# Patient Record
Sex: Male | Born: 1970 | Race: White | Hispanic: No | Marital: Single | Smoking: Current every day smoker
Health system: Southern US, Community
[De-identification: ages and names within clinical notes are randomized; demographics above are authoritative.]

## PROBLEM LIST (undated history)

## (undated) DIAGNOSIS — M1990 Unspecified osteoarthritis, unspecified site: Secondary | ICD-10-CM

## (undated) DIAGNOSIS — F319 Bipolar disorder, unspecified: Secondary | ICD-10-CM

## (undated) DIAGNOSIS — J439 Emphysema, unspecified: Secondary | ICD-10-CM

## (undated) DIAGNOSIS — K759 Inflammatory liver disease, unspecified: Secondary | ICD-10-CM

## (undated) DIAGNOSIS — F419 Anxiety disorder, unspecified: Secondary | ICD-10-CM

## (undated) DIAGNOSIS — C6290 Malignant neoplasm of unspecified testis, unspecified whether descended or undescended: Secondary | ICD-10-CM

## (undated) DIAGNOSIS — F209 Schizophrenia, unspecified: Secondary | ICD-10-CM

## (undated) DIAGNOSIS — R0600 Dyspnea, unspecified: Secondary | ICD-10-CM

## (undated) DIAGNOSIS — J189 Pneumonia, unspecified organism: Secondary | ICD-10-CM

## (undated) DIAGNOSIS — S2249XA Multiple fractures of ribs, unspecified side, initial encounter for closed fracture: Secondary | ICD-10-CM

## (undated) HISTORY — PX: OTHER SURGICAL HISTORY: SHX169

## (undated) HISTORY — PX: REPLACEMENT TOTAL KNEE: SUR1224

## (undated) HISTORY — PX: HAND SURGERY: SHX662

## (undated) HISTORY — PX: NECK SURGERY: SHX720

---

## 2017-05-17 DIAGNOSIS — Z8547 Personal history of malignant neoplasm of testis: Secondary | ICD-10-CM | POA: Insufficient documentation

## 2017-05-17 DIAGNOSIS — M5441 Lumbago with sciatica, right side: Secondary | ICD-10-CM | POA: Insufficient documentation

## 2017-05-17 DIAGNOSIS — M5442 Lumbago with sciatica, left side: Secondary | ICD-10-CM | POA: Insufficient documentation

## 2017-05-17 HISTORY — DX: Personal history of malignant neoplasm of testis: Z85.47

## 2018-03-22 ENCOUNTER — Emergency Department (HOSPITAL_COMMUNITY)
Admission: EM | Admit: 2018-03-22 | Discharge: 2018-03-22 | Disposition: A | Discharge status: Home / Self Care | Payer: Medicare Other | Attending: Emergency Medicine | Admitting: Emergency Medicine

## 2018-03-22 ENCOUNTER — Emergency Department (HOSPITAL_COMMUNITY): Payer: Medicare Other

## 2018-03-22 DIAGNOSIS — S025XXA Fracture of tooth (traumatic), initial encounter for closed fracture: Secondary | ICD-10-CM | POA: Diagnosis not present

## 2018-03-22 DIAGNOSIS — Y998 Other external cause status: Secondary | ICD-10-CM | POA: Diagnosis not present

## 2018-03-22 DIAGNOSIS — Y929 Unspecified place or not applicable: Secondary | ICD-10-CM | POA: Diagnosis not present

## 2018-03-22 DIAGNOSIS — W01198A Fall on same level from slipping, tripping and stumbling with subsequent striking against other object, initial encounter: Secondary | ICD-10-CM | POA: Insufficient documentation

## 2018-03-22 DIAGNOSIS — S0993XA Unspecified injury of face, initial encounter: Secondary | ICD-10-CM

## 2018-03-22 DIAGNOSIS — Y939 Activity, unspecified: Secondary | ICD-10-CM | POA: Diagnosis not present

## 2018-03-22 MED ORDER — HYDROCODONE-ACETAMINOPHEN 5-325 MG PO TABS
2.0000 | ORAL_TABLET | Freq: Once | ORAL | Status: AC
  Administered 2018-03-22: 2 via ORAL
  Filled 2018-03-22: qty 2

## 2018-03-22 NOTE — Care Management (Addendum)
   Name: Ernest Reese, Ernest Reese DOB: 1971/09/10   ED Referral Summary with Disclaimer  Name  Ernest Reese, Ernest Reese     MRN:  [id number]  Department: [facility] Emergency Department  Date of Visit: 03/22/2018  Mr. was seen in the ED and was advised to follow up with you as necessary. It Is the sole responsibility of the patient to arrange for an appointment with you or your practice.  This notification in no way is meant to establish a doctor-patient relationship.   Faxed via [facility] to Dr. Diona Browner office. No further ED CM needs identified

## 2018-03-22 NOTE — ED Provider Notes (Signed)
Patient placed in Quick Look pathway, seen and evaluated   Chief Complaint: Face pain HPI:   Department with chief complaint of wall injury.  States that he slipped in the rain and hit his face on the hitch of his truck knocking out his teeth.  Denies LOC.  Denies any other injury.   ROS: face pain  Physical Exam:   Gen: No distress  Neuro: Awake and Alert  Skin: Warm    Focused Exam:  Physical Exam  Constitutional: Pt appears well-developed and well-nourished.  HENT:  Head: Normocephalic.  Right Ear: Tympanic membrane, external ear and ear canal normal.  Left Ear: Tympanic membrane, external ear and ear canal normal.  Nose: Nose normal. Right sinus exhibits no maxillary sinus tenderness and no frontal sinus tenderness. Left sinus exhibits no maxillary sinus tenderness and no frontal sinus tenderness.  Mouth/Throat: Uvula is midline, oropharynx is clear and moist and mucous membranes are normal. No oral lesions. No uvula swelling or lacerations. No oropharyngeal exudate, posterior oropharyngeal edema, posterior oropharyngeal erythema or tonsillar abscesses.  Poor dentition No gingival swelling, fluctuance or induration No gross abscess  No sublingual edema, tenderness to palpation, or sign of Ludwig's angina, or deep space infection Pain at right inferior maxilla Several broken teeth Eyes: Conjunctivae are normal. Pupils are equal, round, and reactive to light. Right eye exhibits no discharge. Left eye exhibits no discharge.  Neck: Normal range of motion. Neck supple.  No stridor Handling secretions without difficulty No nuchal rigidity No cervical lymphadenopathy Cardiovascular: Normal rate, regular rhythm and normal heart sounds.   Pulmonary/Chest: Effort normal. No respiratory distress.  Equal chest rise  Abdominal: Soft. Bowel sounds are normal. Pt exhibits no distension. There is no tenderness.  Lymphadenopathy: Pt has no cervical adenopathy.  Neurological: Pt is alert and  oriented x 4  Skin: Skin is warm and dry.  Psychiatric: Pt has a normal mood and affect.  Nursing note and vitals reviewed.      Initiation of care has begun. The patient has been counseled on the process, plan, and necessity for staying for the completion/evaluation, and the remainder of the medical screening examination    Montine Circle, Hershal Coria 03/22/18 1401    Dorie Rank, MD 03/24/18 1149

## 2018-03-22 NOTE — Discharge Instructions (Addendum)
Please read attached information. If you experience any new or worsening signs or symptoms please return to the emergency room for evaluation. Please follow-up with your primary care provider or specialist as discussed.  °

## 2018-03-22 NOTE — ED Triage Notes (Signed)
Pt fell last night and hit right side of his front teeth on hitch and lost 2 teeth.

## 2018-09-22 ENCOUNTER — Encounter (HOSPITAL_BASED_OUTPATIENT_CLINIC_OR_DEPARTMENT_OTHER): Payer: Self-pay | Admitting: Emergency Medicine

## 2018-09-22 ENCOUNTER — Emergency Department (HOSPITAL_BASED_OUTPATIENT_CLINIC_OR_DEPARTMENT_OTHER): Payer: Medicare Other

## 2018-09-22 ENCOUNTER — Emergency Department (HOSPITAL_BASED_OUTPATIENT_CLINIC_OR_DEPARTMENT_OTHER)
Admission: EM | Admit: 2018-09-22 | Discharge: 2018-09-22 | Disposition: A | Discharge status: Home / Self Care | Payer: Medicare Other | Attending: Emergency Medicine | Admitting: Emergency Medicine

## 2018-09-22 ENCOUNTER — Other Ambulatory Visit: Payer: Self-pay

## 2018-09-22 DIAGNOSIS — Z8547 Personal history of malignant neoplasm of testis: Secondary | ICD-10-CM | POA: Insufficient documentation

## 2018-09-22 DIAGNOSIS — Z96659 Presence of unspecified artificial knee joint: Secondary | ICD-10-CM | POA: Insufficient documentation

## 2018-09-22 DIAGNOSIS — M25511 Pain in right shoulder: Secondary | ICD-10-CM | POA: Insufficient documentation

## 2018-09-22 DIAGNOSIS — F17210 Nicotine dependence, cigarettes, uncomplicated: Secondary | ICD-10-CM | POA: Insufficient documentation

## 2018-09-22 DIAGNOSIS — Z79899 Other long term (current) drug therapy: Secondary | ICD-10-CM | POA: Diagnosis not present

## 2018-09-22 HISTORY — DX: Emphysema, unspecified: J43.9

## 2018-09-22 HISTORY — DX: Malignant neoplasm of unspecified testis, unspecified whether descended or undescended: C62.90

## 2018-09-22 MED ORDER — KETOROLAC TROMETHAMINE 60 MG/2ML IM SOLN
60.0000 mg | Freq: Once | INTRAMUSCULAR | Status: AC
  Administered 2018-09-22: 60 mg via INTRAMUSCULAR
  Filled 2018-09-22: qty 2

## 2018-09-22 MED ORDER — METHYLPREDNISOLONE SODIUM SUCC 125 MG IJ SOLR
125.0000 mg | Freq: Once | INTRAMUSCULAR | Status: AC
  Administered 2018-09-22: 125 mg via INTRAMUSCULAR
  Filled 2018-09-22: qty 2

## 2018-09-22 MED ORDER — DICLOFENAC SODIUM 75 MG PO TBEC: 75.0000 mg | DELAYED_RELEASE_TABLET | Freq: Two times a day (BID) | ORAL | 0 refills | Status: DC

## 2018-09-22 MED ORDER — METHOCARBAMOL 500 MG PO TABS: 500.0000 mg | ORAL_TABLET | Freq: Two times a day (BID) | ORAL | 0 refills | Status: DC

## 2018-09-22 NOTE — Discharge Instructions (Signed)
Return if any problems.

## 2018-09-22 NOTE — ED Triage Notes (Signed)
Pt is c/o right shoulder pain  Pt states he does not know if it is his rotator cuff or his pinched nerve in his neck  Pt states he woke up with the pain today and it is getting worse  Pt states he is not able to move it or turn his head

## 2018-09-22 NOTE — ED Provider Notes (Signed)
[facility] EMERGENCY DEPARTMENT Provider Note   CSN: [id number] Arrival date & time: 09/22/18  1916     History   Chief Complaint Chief Complaint  Patient presents with  . Shoulder Pain    HPI Ernest Reese is a 47 y.o. male.  The history is provided by the patient. No language interpreter was used.  Shoulder Pain   This is a new problem. The current episode started more than 1 week ago. The problem occurs constantly. The problem has been gradually worsening. The pain is present in the right shoulder. The quality of the pain is described as aching. The pain is moderate. Associated symptoms include limited range of motion. He has tried nothing for the symptoms. The treatment provided no relief. There has been no history of extremity trauma.  Pt reports he has shoulder pain.  Pt reports he is having trouble lifting his arm.   Past Medical History:  Diagnosis Date  . Cancer ([place])   . Emphysema lung ([place])   . Testicular cancer ([place])     There are no active problems to display for this patient.   Past Surgical History:  Procedure Laterality Date  . HAND SURGERY    . NECK SURGERY    . REPLACEMENT TOTAL KNEE    . testicle removed          Home Medications    Prior to Admission medications   Medication Sig Start Date End Date Taking? Authorizing Provider  albuterol (PROVENTIL HFA;VENTOLIN HFA) 108 (90 Base) MCG/ACT inhaler Inhale into the lungs every 6 (six) hours as needed for wheezing or shortness of breath.   Yes [provider]  albuterol (PROVENTIL) (2.5 MG/3ML) 0.083% nebulizer solution Take 2.5 mg by nebulization every 6 (six) hours as needed for wheezing or shortness of breath.   Yes [provider]  acetaminophen (TYLENOL) 500 MG tablet Take 1,000 mg by mouth every 6 (six) hours as needed.    [provider]  diclofenac (VOLTAREN) 75 MG EC tablet Take 1 tablet (75 mg total) by mouth 2 (two) times daily. 09/22/18   Fransico Meadow, PA-C  methocarbamol (ROBAXIN) 500 MG tablet Take 1 tablet (500 mg total) by mouth 2 (two) times daily. 09/22/18   Fransico Meadow, PA-C    Family History Family History  Problem Relation Age of Onset  . Cancer Father     Social History Social History   Tobacco Use  . Smoking status: Current Every Day Smoker  . Smokeless tobacco: Never Used  Substance Use Topics  . Alcohol use: Not Currently    Frequency: Never  . Drug use: Never     Allergies   Patient has no known allergies.   Review of Systems Review of Systems  All other systems reviewed and are negative.    Physical Exam Updated Vital Signs BP 99/67 (BP Location: Left Arm)   Pulse 70   Temp 98.2 F (36.8 C) (Oral)   Resp 18   Ht 6' (1.829 m)   Wt 64.4 kg   SpO2 95%   BMI 19.26 kg/m   Physical Exam  Constitutional: He is oriented to person, place, and time. He appears well-developed and well-nourished.  HENT:  Head: Normocephalic.  Eyes: EOM are normal.  Neck: Normal range of motion.  Pulmonary/Chest: Effort normal.  Abdominal: He exhibits no distension.  Musculoskeletal: He exhibits tenderness.  Tender right shoulder, pain with range of motion, limited range of motion,   nv  and ns intact   Neurological: He is alert and oriented to person, place, and time.  Psychiatric: He has a normal mood and affect.  Nursing note and vitals reviewed.    ED Treatments / Results  Labs (all labs ordered are listed, but only abnormal results are displayed) Labs Reviewed - No data to display  EKG None  Radiology Dg Shoulder Right  Result Date: 09/22/2018 CLINICAL DATA:  47 year old male with right shoulder pain. EXAM: RIGHT SHOULDER - 2+ VIEW COMPARISON:  None. FINDINGS: There is no acute fracture or dislocation. The bones are well mineralized. No arthritic changes. The soft tissues appear unremarkable. Cervical spine fusion plate and screws noted. IMPRESSION: Negative. Electronically Signed   By: Anner Crete M.D.   On: 09/22/2018 21:18    Procedures Procedures (including critical care time)  Medications Ordered in ED Medications  methylPREDNISolone sodium succinate (SOLU-MEDROL) 125 mg/2 mL injection 125 mg (125 mg Intramuscular Given 09/22/18 2119)  ketorolac (TORADOL) injection 60 mg (60 mg Intramuscular Given 09/22/18 2118)     Initial Impression / Assessment and Plan / ED Course  I have reviewed the triage vital signs and the nursing notes.  Pertinent labs & imaging results that were available during my care of the patient were reviewed by me and considered in my medical decision making (see chart for details).    MDM   Pt given torodol and solumderol.   Xray reviewed and discussed with pt.  Pt advised to follow up with Orthopaedist for evaluation.   Final Clinical Impressions(s) / ED Diagnoses   Final diagnoses:  Acute pain of right shoulder    ED Discharge Orders         Ordered    diclofenac (VOLTAREN) 75 MG EC tablet  2 times daily     09/22/18 2139    methocarbamol (ROBAXIN) 500 MG tablet  2 times daily     09/22/18 2139        An After Visit Summary was printed and given to the patient.    Fransico Meadow, PA-C 09/22/18 [place], Dan, DO 09/25/18 0700

## 2018-12-09 ENCOUNTER — Inpatient Hospital Stay (HOSPITAL_COMMUNITY): Payer: Medicare Other

## 2018-12-09 ENCOUNTER — Encounter (HOSPITAL_BASED_OUTPATIENT_CLINIC_OR_DEPARTMENT_OTHER): Payer: Self-pay | Admitting: Emergency Medicine

## 2018-12-09 ENCOUNTER — Emergency Department (HOSPITAL_BASED_OUTPATIENT_CLINIC_OR_DEPARTMENT_OTHER): Payer: Medicare Other

## 2018-12-09 ENCOUNTER — Other Ambulatory Visit: Payer: Self-pay

## 2018-12-09 ENCOUNTER — Inpatient Hospital Stay (HOSPITAL_BASED_OUTPATIENT_CLINIC_OR_DEPARTMENT_OTHER)
Admission: EM | Admit: 2018-12-09 | Discharge: 2018-12-10 | DRG: 683 | Discharge status: Against Medical Advice | Payer: Medicare Other | Attending: Internal Medicine | Admitting: Internal Medicine

## 2018-12-09 DIAGNOSIS — Z8547 Personal history of malignant neoplasm of testis: Secondary | ICD-10-CM

## 2018-12-09 DIAGNOSIS — Z96659 Presence of unspecified artificial knee joint: Secondary | ICD-10-CM | POA: Diagnosis present

## 2018-12-09 DIAGNOSIS — N179 Acute kidney failure, unspecified: Secondary | ICD-10-CM | POA: Diagnosis present

## 2018-12-09 DIAGNOSIS — J441 Chronic obstructive pulmonary disease with (acute) exacerbation: Secondary | ICD-10-CM | POA: Diagnosis present

## 2018-12-09 DIAGNOSIS — E872 Acidosis: Secondary | ICD-10-CM | POA: Diagnosis present

## 2018-12-09 DIAGNOSIS — E46 Unspecified protein-calorie malnutrition: Secondary | ICD-10-CM | POA: Insufficient documentation

## 2018-12-09 DIAGNOSIS — Z791 Long term (current) use of non-steroidal anti-inflammatories (NSAID): Secondary | ICD-10-CM

## 2018-12-09 DIAGNOSIS — Z79899 Other long term (current) drug therapy: Secondary | ICD-10-CM | POA: Diagnosis not present

## 2018-12-09 DIAGNOSIS — S46001A Unspecified injury of muscle(s) and tendon(s) of the rotator cuff of right shoulder, initial encounter: Secondary | ICD-10-CM | POA: Diagnosis present

## 2018-12-09 DIAGNOSIS — N289 Disorder of kidney and ureter, unspecified: Secondary | ICD-10-CM | POA: Insufficient documentation

## 2018-12-09 DIAGNOSIS — Y990 Civilian activity done for income or pay: Secondary | ICD-10-CM

## 2018-12-09 DIAGNOSIS — K7469 Other cirrhosis of liver: Secondary | ICD-10-CM | POA: Diagnosis not present

## 2018-12-09 DIAGNOSIS — R739 Hyperglycemia, unspecified: Secondary | ICD-10-CM | POA: Diagnosis present

## 2018-12-09 DIAGNOSIS — D509 Iron deficiency anemia, unspecified: Secondary | ICD-10-CM | POA: Diagnosis present

## 2018-12-09 DIAGNOSIS — E7439 Other disorders of intestinal carbohydrate absorption: Secondary | ICD-10-CM | POA: Diagnosis present

## 2018-12-09 DIAGNOSIS — J439 Emphysema, unspecified: Secondary | ICD-10-CM | POA: Insufficient documentation

## 2018-12-09 DIAGNOSIS — N19 Unspecified kidney failure: Secondary | ICD-10-CM

## 2018-12-09 DIAGNOSIS — F17210 Nicotine dependence, cigarettes, uncomplicated: Secondary | ICD-10-CM | POA: Diagnosis present

## 2018-12-09 DIAGNOSIS — N185 Chronic kidney disease, stage 5: Secondary | ICD-10-CM

## 2018-12-09 DIAGNOSIS — D649 Anemia, unspecified: Secondary | ICD-10-CM | POA: Diagnosis present

## 2018-12-09 DIAGNOSIS — E440 Moderate protein-calorie malnutrition: Secondary | ICD-10-CM

## 2018-12-09 DIAGNOSIS — K7460 Unspecified cirrhosis of liver: Secondary | ICD-10-CM | POA: Diagnosis present

## 2018-12-09 DIAGNOSIS — B1920 Unspecified viral hepatitis C without hepatic coma: Secondary | ICD-10-CM | POA: Diagnosis not present

## 2018-12-09 DIAGNOSIS — D631 Anemia in chronic kidney disease: Secondary | ICD-10-CM

## 2018-12-09 DIAGNOSIS — Z9079 Acquired absence of other genital organ(s): Secondary | ICD-10-CM | POA: Diagnosis not present

## 2018-12-09 DIAGNOSIS — I10 Essential (primary) hypertension: Secondary | ICD-10-CM | POA: Diagnosis present

## 2018-12-09 DIAGNOSIS — E875 Hyperkalemia: Secondary | ICD-10-CM | POA: Diagnosis present

## 2018-12-09 HISTORY — DX: Moderate protein-calorie malnutrition: E44.0

## 2018-12-09 HISTORY — DX: Acute kidney failure, unspecified: N17.9

## 2018-12-09 LAB — CBC WITH DIFFERENTIAL/PLATELET
Abs Immature Granulocytes: 0.1 10*3/uL — ABNORMAL HIGH (ref 0.00–0.07)
BASOS ABS: 0 10*3/uL (ref 0.0–0.1)
BASOS PCT: 0 %
EOS ABS: 0.1 10*3/uL (ref 0.0–0.5)
EOS PCT: 1 %
HCT: 22.9 % — ABNORMAL LOW (ref 39.0–52.0)
Hemoglobin: 7.3 g/dL — ABNORMAL LOW (ref 13.0–17.0)
Immature Granulocytes: 1 %
LYMPHS PCT: 12 %
Lymphs Abs: 1.4 10*3/uL (ref 0.7–4.0)
MCH: 26.4 pg (ref 26.0–34.0)
MCHC: 31.9 g/dL (ref 30.0–36.0)
MCV: 83 fL (ref 80.0–100.0)
MONO ABS: 1 10*3/uL (ref 0.1–1.0)
Monocytes Relative: 9 %
NRBC: 0 % (ref 0.0–0.2)
Neutro Abs: 8.9 10*3/uL — ABNORMAL HIGH (ref 1.7–7.7)
Neutrophils Relative %: 77 %
PLATELETS: 193 10*3/uL (ref 150–400)
RBC: 2.76 MIL/uL — AB (ref 4.22–5.81)
RDW: 15.7 % — AB (ref 11.5–15.5)
WBC: 11.6 10*3/uL — AB (ref 4.0–10.5)

## 2018-12-09 LAB — URINALYSIS, ROUTINE W REFLEX MICROSCOPIC
Bilirubin Urine: NEGATIVE
Glucose, UA: 150 mg/dL — AB
Ketones, ur: NEGATIVE mg/dL
Nitrite: POSITIVE — AB
Protein, ur: 100 mg/dL — AB
Specific Gravity, Urine: 1.01 (ref 1.005–1.030)
pH: 5 (ref 5.0–8.0)

## 2018-12-09 LAB — RETICULOCYTES
Immature Retic Fract: 12.4 % (ref 2.3–15.9)
RBC.: 2.79 MIL/uL — ABNORMAL LOW (ref 4.22–5.81)
Retic Count, Absolute: 14.2 10*3/uL — ABNORMAL LOW (ref 19.0–186.0)
Retic Ct Pct: 0.5 % (ref 0.4–3.1)

## 2018-12-09 LAB — HEMOGLOBIN A1C
Hgb A1c MFr Bld: 6.1 % — ABNORMAL HIGH (ref 4.8–5.6)
Mean Plasma Glucose: 128.37 mg/dL

## 2018-12-09 LAB — RAPID URINE DRUG SCREEN, HOSP PERFORMED
Amphetamines: NOT DETECTED
Barbiturates: NOT DETECTED
Benzodiazepines: NOT DETECTED
Cocaine: NOT DETECTED
Opiates: POSITIVE — AB
Tetrahydrocannabinol: NOT DETECTED

## 2018-12-09 LAB — BASIC METABOLIC PANEL
Anion gap: 14 (ref 5–15)
Anion gap: 19 — ABNORMAL HIGH (ref 5–15)
BUN: 83 mg/dL — AB (ref 6–20)
BUN: 89 mg/dL — ABNORMAL HIGH (ref 6–20)
CALCIUM: 6.4 mg/dL — AB (ref 8.9–10.3)
CHLORIDE: 105 mmol/L (ref 98–111)
CO2: 14 mmol/L — ABNORMAL LOW (ref 22–32)
CO2: 12 mmol/L — ABNORMAL LOW (ref 22–32)
CREATININE: 8.33 mg/dL — AB (ref 0.61–1.24)
Calcium: 6.8 mg/dL — ABNORMAL LOW (ref 8.9–10.3)
Chloride: 105 mmol/L (ref 98–111)
Creatinine, Ser: 8.59 mg/dL — ABNORMAL HIGH (ref 0.61–1.24)
GFR calc Af Amer: 8 mL/min — ABNORMAL LOW (ref >60)
GFR calc Af Amer: 8 mL/min — ABNORMAL LOW (ref >60)
GFR calc non Af Amer: 7 mL/min — ABNORMAL LOW (ref >60)
GFR, EST NON AFRICAN AMERICAN: 7 mL/min — AB (ref >60)
Glucose, Bld: 164 mg/dL — ABNORMAL HIGH (ref 70–99)
Glucose, Bld: 254 mg/dL — ABNORMAL HIGH (ref 70–99)
Potassium: 4.1 mmol/L (ref 3.5–5.1)
Potassium: 4.9 mmol/L (ref 3.5–5.1)
SODIUM: 133 mmol/L — AB (ref 135–145)
SODIUM: 136 mmol/L (ref 135–145)

## 2018-12-09 LAB — CBC
HCT: 23.3 % — ABNORMAL LOW (ref 39.0–52.0)
Hemoglobin: 7.4 g/dL — ABNORMAL LOW (ref 13.0–17.0)
MCH: 26.5 pg (ref 26.0–34.0)
MCHC: 31.8 g/dL (ref 30.0–36.0)
MCV: 83.5 fL (ref 80.0–100.0)
NRBC: 0 % (ref 0.0–0.2)
Platelets: 194 10*3/uL (ref 150–400)
RBC: 2.79 MIL/uL — ABNORMAL LOW (ref 4.22–5.81)
RDW: 15.6 % — AB (ref 11.5–15.5)
WBC: 8.1 10*3/uL (ref 4.0–10.5)

## 2018-12-09 LAB — IRON AND TIBC
IRON: 19 ug/dL — AB (ref 45–182)
Saturation Ratios: 9 % — ABNORMAL LOW (ref 17.9–39.5)
TIBC: 217 ug/dL — ABNORMAL LOW (ref 250–450)
UIBC: 198 ug/dL

## 2018-12-09 LAB — SODIUM, URINE, RANDOM: Sodium, Ur: 82 mmol/L

## 2018-12-09 LAB — VITAMIN B12: Vitamin B-12: 701 pg/mL (ref 180–914)

## 2018-12-09 LAB — FERRITIN: Ferritin: 211 ng/mL (ref 24–336)

## 2018-12-09 LAB — MAGNESIUM: MAGNESIUM: 2 mg/dL (ref 1.7–2.4)

## 2018-12-09 LAB — FOLATE: Folate: 15.3 ng/mL (ref >5.9)

## 2018-12-09 LAB — PREPARE RBC (CROSSMATCH)

## 2018-12-09 LAB — ABO/RH: ABO/RH(D): B POS

## 2018-12-09 MED ORDER — STERILE WATER FOR INJECTION IV SOLN
INTRAVENOUS | Status: DC
  Administered 2018-12-09 – 2018-12-10 (×2): via INTRAVENOUS
  Filled 2018-12-09 (×5): qty 850

## 2018-12-09 MED ORDER — ALBUTEROL SULFATE (2.5 MG/3ML) 0.083% IN NEBU
2.5000 mg | INHALATION_SOLUTION | Freq: Once | RESPIRATORY_TRACT | Status: AC
  Administered 2018-12-09: 2.5 mg via RESPIRATORY_TRACT
  Filled 2018-12-09: qty 3

## 2018-12-09 MED ORDER — ALBUTEROL SULFATE (2.5 MG/3ML) 0.083% IN NEBU
2.5000 mg | INHALATION_SOLUTION | Freq: Four times a day (QID) | RESPIRATORY_TRACT | Status: DC | PRN
  Administered 2018-12-10 (×2): 2.5 mg via RESPIRATORY_TRACT
  Filled 2018-12-09 (×2): qty 3

## 2018-12-09 MED ORDER — ACETAMINOPHEN 650 MG RE SUPP: 650.0000 mg | Freq: Four times a day (QID) | RECTAL | Status: DC | PRN

## 2018-12-09 MED ORDER — ACETAMINOPHEN 325 MG PO TABS
650.0000 mg | ORAL_TABLET | Freq: Four times a day (QID) | ORAL | Status: DC | PRN
  Administered 2018-12-10: 650 mg via ORAL
  Filled 2018-12-09: qty 2

## 2018-12-09 MED ORDER — METHYLPREDNISOLONE SODIUM SUCC 125 MG IJ SOLR
125.0000 mg | Freq: Once | INTRAMUSCULAR | Status: AC
  Administered 2018-12-09: 125 mg via INTRAVENOUS
  Filled 2018-12-09: qty 2

## 2018-12-09 MED ORDER — TRAMADOL HCL 50 MG PO TABS
50.0000 mg | ORAL_TABLET | Freq: Four times a day (QID) | ORAL | Status: DC | PRN
  Administered 2018-12-09 – 2018-12-10 (×2): 50 mg via ORAL
  Filled 2018-12-09 (×2): qty 1

## 2018-12-09 MED ORDER — SODIUM CHLORIDE 0.9 % IV SOLN
1.0000 g | Freq: Once | INTRAVENOUS | Status: DC
  Filled 2018-12-09: qty 10

## 2018-12-09 MED ORDER — ENSURE ENLIVE PO LIQD
237.0000 mL | Freq: Two times a day (BID) | ORAL | Status: DC
  Administered 2018-12-10 (×2): 237 mL via ORAL

## 2018-12-09 MED ORDER — FAMOTIDINE 20 MG PO TABS
20.0000 mg | ORAL_TABLET | Freq: Every day | ORAL | Status: DC
  Administered 2018-12-09: 20 mg via ORAL
  Filled 2018-12-09: qty 1

## 2018-12-09 MED ORDER — ONDANSETRON HCL 4 MG/2ML IJ SOLN: 4.0000 mg | Freq: Four times a day (QID) | INTRAMUSCULAR | Status: DC | PRN

## 2018-12-09 MED ORDER — SODIUM CHLORIDE 0.9% IV SOLUTION: Freq: Once | INTRAVENOUS | Status: DC

## 2018-12-09 MED ORDER — ONDANSETRON HCL 4 MG PO TABS: 4.0000 mg | ORAL_TABLET | Freq: Four times a day (QID) | ORAL | Status: DC | PRN

## 2018-12-09 MED ORDER — SODIUM CHLORIDE 0.9 % IV SOLN
INTRAVENOUS | Status: DC | PRN
  Administered 2018-12-09: 04:00:00 via INTRAVENOUS

## 2018-12-09 MED ORDER — SODIUM CHLORIDE 0.9 % IV SOLN
INTRAVENOUS | Status: DC
  Administered 2018-12-09: 16:00:00 via INTRAVENOUS

## 2018-12-09 MED ORDER — SODIUM CHLORIDE 0.9 % IV BOLUS
1000.0000 mL | Freq: Once | INTRAVENOUS | Status: AC
  Administered 2018-12-09: 1000 mL via INTRAVENOUS

## 2018-12-09 MED ORDER — ALBUTEROL (5 MG/ML) CONTINUOUS INHALATION SOLN
10.0000 mg/h | INHALATION_SOLUTION | RESPIRATORY_TRACT | Status: AC
  Administered 2018-12-09: 10 mg/h via RESPIRATORY_TRACT
  Filled 2018-12-09: qty 20

## 2018-12-09 MED ORDER — MAGNESIUM SULFATE 2 GM/50ML IV SOLN
2.0000 g | Freq: Once | INTRAVENOUS | Status: AC
  Administered 2018-12-09: 2 g via INTRAVENOUS
  Filled 2018-12-09: qty 50

## 2018-12-09 MED ORDER — CALCIUM GLUCONATE-NACL 1-0.675 GM/50ML-% IV SOLN
1.0000 g | Freq: Once | INTRAVENOUS | Status: AC
  Administered 2018-12-09: 1000 mg via INTRAVENOUS

## 2018-12-09 MED ORDER — METHOCARBAMOL 500 MG PO TABS
500.0000 mg | ORAL_TABLET | Freq: Two times a day (BID) | ORAL | Status: DC
  Administered 2018-12-09 – 2018-12-10 (×2): 500 mg via ORAL
  Filled 2018-12-09 (×2): qty 1

## 2018-12-09 MED ORDER — IPRATROPIUM-ALBUTEROL 0.5-2.5 (3) MG/3ML IN SOLN
3.0000 mL | Freq: Once | RESPIRATORY_TRACT | Status: AC
  Administered 2018-12-09: 3 mL via RESPIRATORY_TRACT
  Filled 2018-12-09: qty 3

## 2018-12-09 MED ORDER — DIPHENHYDRAMINE HCL 25 MG PO CAPS
25.0000 mg | ORAL_CAPSULE | Freq: Once | ORAL | Status: DC
  Filled 2018-12-09: qty 1

## 2018-12-09 MED ORDER — FAMOTIDINE 20 MG PO TABS: 20.0000 mg | ORAL_TABLET | Freq: Two times a day (BID) | ORAL | Status: DC

## 2018-12-09 MED ORDER — CALCIUM GLUCONATE-NACL 1-0.675 GM/50ML-% IV SOLN
INTRAVENOUS | Status: AC
  Filled 2018-12-09: qty 50

## 2018-12-09 MED ORDER — PREDNISONE 20 MG PO TABS
40.0000 mg | ORAL_TABLET | Freq: Every day | ORAL | Status: DC
  Administered 2018-12-10: 40 mg via ORAL
  Filled 2018-12-09: qty 2

## 2018-12-09 MED ORDER — POLYETHYLENE GLYCOL 3350 17 G PO PACK: 17.0000 g | PACK | Freq: Every day | ORAL | Status: DC | PRN

## 2018-12-09 NOTE — Progress Notes (Signed)
Initial Nutrition Assessment  DOCUMENTATION CODES:   Non-severe (moderate) malnutrition in context of chronic illness  INTERVENTION:   Ensure Enlive po BID, each supplement provides 350 kcal and 20 grams of protein  Encouraged PO intake at meals.   NUTRITION DIAGNOSIS:   Moderate Malnutrition related to chronic illness(COPD) as evidenced by moderate fat depletion, moderate muscle depletion.  GOAL:   Patient will meet greater than or equal to 90% of their needs  MONITOR:   PO intake, Supplement acceptance, Labs  REASON FOR ASSESSMENT:   Consult COPD Protocol  ASSESSMENT:   Pt with PMH of emphysema and testicular cancer s/p chemo who is admitted with COPD exacerbation. Pt with new AKI and reports that he is taking 6400 mg of ibuprofen daily for the last 3 months.    Renal consult pending.   Pt reports that when he was in prison he was eating 3 meals per day and exercising and weighed 200 lb. When he got out he went back to his usual eating habits and has lost weight. He is currently 154 lb but he would like to be 170-180 lb. He reports having lost weight recently after he and his wife separated. He is unsure of how much over what time frame.  He has a good appetite and is hungry. He was unable to eat lunch due to pending test. He has drank some ensure before and likes vanilla. He is willing to drink these while here. He does admit to taking 32 - 200 mg ibuprofen tablets per day due to pain for the past 3 months. He reports having torn his R rotator cuff. He cannot lift his R arm and his R torso is very sore to touch as well.  Unable to complete an exam of all areas as lab is in room and attempting to draw blood before a transfusion. He has just been moved to his room and still has on jeans and his boots.  He has no trouble chewing or swallowing, although he is missing his upper teeth.   Medications reviewed and include: prednisone NS x 1 L then 125 ml/hr Labs reviewed: BUN:  83 (H), Cr: 8.33 (H)  NUTRITION - FOCUSED PHYSICAL EXAM:    Most Recent Value  Orbital Region  Moderate depletion  Upper Arm Region  Moderate depletion  Thoracic and Lumbar Region  Moderate depletion  Buccal Region  Moderate depletion  Temple Region  Moderate depletion  Clavicle Bone Region  Moderate depletion  Clavicle and Acromion Bone Region  Moderate depletion  Scapular Bone Region  Unable to assess  Dorsal Hand  Unable to assess  Patellar Region  Unable to assess  Anterior Thigh Region  Unable to assess  Posterior Calf Region  Unable to assess  Edema (RD Assessment)  None  Hair  Reviewed  Eyes  Reviewed  Mouth  Reviewed [missing upper teeth]  Skin  Reviewed  Nails  Reviewed       Diet Order:   Diet Order            Diet regular Room service appropriate? Yes; Fluid consistency: Thin  Diet effective now              EDUCATION NEEDS:   Education needs have been addressed  Skin:  Skin Assessment: Reviewed RN Assessment  Last BM:  unknown  Height:   Ht Readings from Last 1 Encounters:  12/09/18 6' (1.829 m)    Weight:   Wt Readings from Last 1 Encounters:  12/09/18 70.3 kg    Ideal Body Weight:  80.2 kg  BMI:  Body mass index is 21.02 kg/m.  Estimated Nutritional Needs:   Kcal:  2100-2300  Protein:  85-105 grams  Fluid:  >2.1 L/day  Maylon Peppers RD, LDN, CNSC [phone] Pager [phone] After Hours Pager

## 2018-12-09 NOTE — ED Notes (Signed)
Date and time results received: 12/09/18 0434 (use smartphrase ".now" to insert current time)  Test: calcium Critical Value: 6.9  Name of Provider Notified: Dr. Florina Ou  Orders Received? Or Actions Taken?: no new orders

## 2018-12-09 NOTE — ED Notes (Signed)
Spoke with Veronda in [facility] at [facility] about Calcium gluconate concentrations available

## 2018-12-09 NOTE — ED Notes (Signed)
Attempted to call report. Spoke with Ronalee Belts and he will be calling me back.

## 2018-12-09 NOTE — ED Notes (Signed)
Report given to Ronalee Belts, [place].  Ms. Jeovanny Cuadros was notified of this transfer.

## 2018-12-09 NOTE — ED Notes (Signed)
Date and time results received: 12/09/18 4:37 AM (use smartphrase ".now" to insert current time)  Test: Calcium Critical Value: 6.4  Name of Provider Notified: Dr. Jenny Reichmann [place]  Orders Received? Or Actions Taken?:

## 2018-12-09 NOTE — H&P (Signed)
History and Physical  JONTAY MASTON MPN:[id number] DOB: 12/26/1971 DOA: 12/09/2018  Referring physician: Dr Florina Ou, ED physician [facility]CP: [facility] [place]  Outpatient Specialists: none  Patient Coming From: home  Chief Complaint: SOB  HPI: CASMERE HOLLENBECK is a 47 y.o. male with a history of emphysema, history of testicular cancer.  Patient's presents with shortness of breath that started yesterday morning.  His symptoms are moderate to severe.  He has been using his albuterol at home without relief.  He does have a cough that is nonproductive.  He was seen at [facility] ED given steroids and nebulizer treatments with improvement.  Blood work was done which shows acute kidney injury.  Patient has had shoulder injury approximately 3 months ago and has been taking about 6400 mg of ibuprofen daily for the past 3 months.  He denies abdominal pain, melena, rectal bleeding.  No other palliating or provoking factors.  Emergency Department Course: Patient given Solu-Medrol 125 mg, neb, IV magnesium. Transferred here.  Review of Systems:   Pt denies any fevers, chills, nausea, vomiting, diarrhea, constipation, abdominal pain, orthopnea, palpitations, headache, vision changes, lightheadedness, dizziness, melena, rectal bleeding.  Review of systems are otherwise negative  Past Medical History:  Diagnosis Date  . Emphysema lung ([place])   . Testicular cancer [facility])    Past Surgical History:  Procedure Laterality Date  . HAND SURGERY    . NECK SURGERY    . REPLACEMENT TOTAL KNEE    . testicle removed     Social History:  reports that he has been smoking. He has never used smokeless tobacco. He reports previous alcohol use. He reports that he does not use drugs. Patient lives at home  No Known Allergies  Family History  Problem Relation Age of Onset  . Cancer Father       Prior to Admission medications   Medication Sig Start Date End Date Taking? Authorizing Provider    acetaminophen (TYLENOL) 500 MG tablet Take 1,000 mg by mouth every 6 (six) hours as needed.    [provider]  albuterol (PROVENTIL HFA;VENTOLIN HFA) 108 (90 Base) MCG/ACT inhaler Inhale into the lungs every 6 (six) hours as needed for wheezing or shortness of breath.    [provider]  albuterol (PROVENTIL) (2.5 MG/3ML) 0.083% nebulizer solution Take 2.5 mg by nebulization every 6 (six) hours as needed for wheezing or shortness of breath.    [provider]  diclofenac (VOLTAREN) 75 MG EC tablet Take 1 tablet (75 mg total) by mouth 2 (two) times daily. 09/22/18   Fransico Meadow, PA-C  methocarbamol (ROBAXIN) 500 MG tablet Take 1 tablet (500 mg total) by mouth 2 (two) times daily. 09/22/18   Fransico Meadow, PA-C    Physical Exam: BP 132/75 (BP Location: Right Arm)   Pulse 93   Temp 97.8 F (36.6 C) (Oral)   Resp 18   Ht 6' (1.829 m)   Wt 70.3 kg   SpO2 98%   BMI 21.02 kg/m   . General: Middle-aged Caucasian male. Awake and alert and oriented x3. No acute cardiopulmonary distress.  Marland Kitchen HEENT: Normocephalic atraumatic.  Right and left ears normal in appearance.  Pupils equal, round, reactive to light. Extraocular muscles are intact. Sclerae anicteric and noninjected.  Moist mucosal membranes. No mucosal lesions.  . Neck: Neck supple without lymphadenopathy. No carotid bruits. No masses palpated.  . Cardiovascular: Regular rate with normal S1-S2 sounds. No murmurs, rubs, gallops auscultated. No  JVD.  . Respiratory: Good respiratory effort.  Coarse expiratory wheezes. No accessory muscle use. . Abdomen: Soft, nontender, nondistended. Active bowel sounds. No masses or hepatosplenomegaly  . Skin: No rashes, lesions, or ulcerations.  Dry, warm to touch. 2+ dorsalis pedis and radial pulses. . Musculoskeletal: No calf or leg pain. All major joints not erythematous nontender.  No upper or lower joint deformation.  Good ROM.  No contractures  . Psychiatric: Intact  judgment and insight. Pleasant and cooperative. . Neurologic: No focal neurological deficits. Strength is 5/5 and symmetric in upper and lower extremities.  Cranial nerves II through XII are grossly intact.           Labs on Admission: I have personally reviewed following labs and imaging studies  CBC: Recent Labs  Lab 12/09/18 0339  WBC 11.6*  NEUTROABS 8.9*  HGB 7.3*  HCT 22.9*  MCV 83.0  PLT 194   Basic Metabolic Panel: Recent Labs  Lab 12/09/18 0339  NA 133*  K 4.1  CL 105  CO2 14*  GLUCOSE 164*  BUN 83*  CREATININE 8.33*  CALCIUM 6.4*  MG 2.0   GFR: Estimated Creatinine Clearance: 11 mL/min (A) (by C-G formula based on SCr of 8.33 mg/dL (H)). Liver Function Tests: No results for input(s): AST, ALT, ALKPHOS, BILITOT, PROT, ALBUMIN in the last 168 hours. No results for input(s): LIPASE, AMYLASE in the last 168 hours. No results for input(s): AMMONIA in the last 168 hours. Coagulation Profile: No results for input(s): INR, PROTIME in the last 168 hours. Cardiac Enzymes: No results for input(s): CKTOTAL, CKMB, CKMBINDEX, TROPONINI in the last 168 hours. BNP (last 3 results) No results for input(s): PROBNP in the last 8760 hours. HbA1C: No results for input(s): HGBA1C in the last 72 hours. CBG: No results for input(s): GLUCAP in the last 168 hours. Lipid Profile: No results for input(s): CHOL, HDL, LDLCALC, TRIG, CHOLHDL, LDLDIRECT in the last 72 hours. Thyroid Function Tests: No results for input(s): TSH, T4TOTAL, FREET4, T3FREE, THYROIDAB in the last 72 hours. Anemia Panel: No results for input(s): VITAMINB12, FOLATE, FERRITIN, TIBC, IRON, RETICCTPCT in the last 72 hours. Urine analysis: No results found for: COLORURINE, APPEARANCEUR, LABSPEC, PHURINE, GLUCOSEU, HGBUR, BILIRUBINUR, KETONESUR, PROTEINUR, UROBILINOGEN, NITRITE, LEUKOCYTESUR Sepsis Labs: @LABRCNTIP (procalcitonin:4,lacticidven:4) )No results found for this or any previous visit (from the past  240 hour(s)).   Radiological Exams on Admission: Dg Chest 2 View  Result Date: 12/09/2018 CLINICAL DATA:  Increased cough and shortness of breath. COPD. EXAM: CHEST - 2 VIEW COMPARISON:  01/23/2017. FINDINGS: Normal sized heart. Clear lungs. The lungs remain hyperexpanded with mild peribronchial thickening. Cervical spine fixation hardware. Mild scoliosis. IMPRESSION: Stable changes of COPD and chronic bronchitis. No acute abnormality. Electronically Signed   By: Claudie Revering M.D.   On: 12/09/2018 03:07   Dg Humerus Right  Result Date: 12/09/2018 CLINICAL DATA:  47 year old male with reported lump in the right mid arm. EXAM: RIGHT HUMERUS - 2+ VIEW COMPARISON:  Right shoulder radiograph dated 09/22/2018 FINDINGS: There is an area of periosteal thickening in the mid right humeral diaphysis which is not evaluated on this radiograph but may be related to chronic infection or stress. Further evaluation with MRI on a nonemergent basis recommended. There is no acute fracture or dislocation. There is elevated appearance of the humeral head in relation to the glenoid which may be partly positional but is concerning for chronic rotator cuff injury. Evaluation of the shoulder is limited on this radiograph. If there is pain  over the shoulder dedicated shoulder radiograph is recommended for better evaluation. The soft tissues are unremarkable. IMPRESSION: 1. No acute fracture or dislocation. 2. Focal area of periosteal thickening in the mid right humeral diaphysis possibly related to chronic infection or stress. Further evaluation with MRI on a nonemergent basis recommended. 3. Elevation of the humeral head in relation to the glenoid, concerning for chronic rotator cuff injury. If there is pain over the right shoulder dedicated radiograph of the shoulder recommended for further evaluation. Electronically Signed   By: Anner Crete M.D.   On: 12/09/2018 03:17     Assessment/Plan: Principal Problem:   Acute  kidney failure ([place]) Active Problems:   COPD with acute exacerbation ([place])   Anemia   ARF (acute renal failure) ([place])    This patient was discussed with the ED physician, including pertinent vitals, physical exam findings, labs, and imaging.  We also discussed care given by the ED provider.  1. Acute kidney failure a. Admit b. Secondary to heavy ibuprofen use with question of chronic component c. IV fluids: 1 L bolus with 125 mL's continuously. d. Patient still making plenty of urine e. Check UA, renal ultrasound, SPEP, UPEP f. Repeat BMP now and tomorrow morning g. Consulted with nephrology who will see the patient. h. Will stop NSAIDs 2. COPD with acute exacerbation a. Prednisone b. Albuterol every 6 hours as needed 3. Anemia a. Uncertain as to the patient's etiology -possibly component of chronic, especially if there is chronic kidney disease. b. Check anemia labs c. Transfuse 2 units d. Recheck hemoglobin tomorrow e. Hemoccult  DVT prophylaxis: SCDs Consultants: Nephrology Code Status: Full code Family Communication: None Disposition Plan: Patient to return home following admission   Jacob Moores [facility] Pager [phone]  If 7PM-7AM, please contact night-coverage [email or web address] Password [email or web address]

## 2018-12-09 NOTE — Plan of Care (Addendum)
47 yo M with what he thought was COPD exacerbation, but in fact patient is in what looks like probably chronic renal failure undiagnosed with creat of 8.x.  Calcium 6.4, got 1g calcium.  Bicarb 14, anemia with HGB of 7.3.  Suspicion is undiagnosed underlying CKD stage 5.  Call nephrology this morning after he arrives.  Will send to Tele.  K 4.1.  Please call flow manager after patient arrives to [facility].

## 2018-12-09 NOTE — ED Provider Notes (Signed)
[facility] DEPT [facility] Provider Note: Georgena Spurling, MD, FACEP  CSN: [id number] MRN: [id number] ARRIVAL: 12/09/18 at [place]: [facility]   HISTORY OF PRESENT ILLNESS  12/09/18 3:18 AM Ernest Reese is a 47 y.o. male with a history of COPD.  He is here with a COPD exacerbation that began yesterday morning.  Symptoms are moderate to severe.  He has been using his albuterol at home without adequate relief.  He had a similar episode last year that resulted in hospitalization for pneumonia and he wishes to avoid that.  He was given to neb treatments prior to my evaluation with only partial relief of his dyspnea.  He continues to have expiratory wheezes.  The patient injured his right rotator cuff about 3 months ago.  He continues to have pain in that shoulder as well as a painful knot on his right mid humerus.    Past Medical History:  Diagnosis Date  . Emphysema lung ([place])   . Testicular cancer [facility])     Past Surgical History:  Procedure Laterality Date  . HAND SURGERY    . NECK SURGERY    . REPLACEMENT TOTAL KNEE    . testicle removed      Family History  Problem Relation Age of Onset  . Cancer Father     Social History   Tobacco Use  . Smoking status: Current Every Day Smoker  . Smokeless tobacco: Never Used  Substance Use Topics  . Alcohol use: Not Currently    Frequency: Never  . Drug use: Never    Prior to Admission medications   Medication Sig Start Date End Date Taking? Authorizing Provider  acetaminophen (TYLENOL) 500 MG tablet Take 1,000 mg by mouth every 6 (six) hours as needed.    [provider]  albuterol (PROVENTIL HFA;VENTOLIN HFA) 108 (90 Base) MCG/ACT inhaler Inhale into the lungs every 6 (six) hours as needed for wheezing or shortness of breath.    [provider]  albuterol (PROVENTIL) (2.5 MG/3ML) 0.083% nebulizer solution Take 2.5 mg by nebulization every 6 (six) hours as needed for  wheezing or shortness of breath.    [provider]  diclofenac (VOLTAREN) 75 MG EC tablet Take 1 tablet (75 mg total) by mouth 2 (two) times daily. 09/22/18   Fransico Meadow, PA-C  methocarbamol (ROBAXIN) 500 MG tablet Take 1 tablet (500 mg total) by mouth 2 (two) times daily. 09/22/18   Fransico Meadow, PA-C    Allergies Patient has no known allergies.   REVIEW OF SYSTEMS  Negative except as noted here or in the History of Present Illness.   PHYSICAL EXAMINATION  Initial Vital Signs Blood pressure (!) 141/72, pulse 95, temperature 97.6 F (36.4 C), temperature source Oral, resp. rate 20, height 6' (1.829 m), weight 70.3 kg, SpO2 95 %.  Examination General: Well-developed, well-nourished male in no acute distress; appearance consistent with age of record HENT: normocephalic; atraumatic Eyes: pupils equal, round and reactive to light; extraocular muscles intact Neck: supple Heart: regular rate and rhythm Lungs: Expiratory wheezes bilaterally Abdomen: soft; nondistended; nontender; bowel sounds present Extremities: No deformity; full range of motion except right shoulder; tender bony not right mid humerus Neurologic: Awake, alert and oriented; motor function intact in all extremities and symmetric; no facial droop Skin: Warm and dry Psychiatric: Normal mood and affect   RESULTS  Summary of this visit's results, reviewed by myself:   EKG Interpretation  Date/Time:  Ventricular Rate:    PR Interval:    QRS Duration:   QT Interval:    QTC Calculation:   R Axis:     Text Interpretation:        Laboratory Studies: Results for orders placed or performed during the hospital encounter of 12/09/18 (from the past 24 hour(s))  CBC with Differential/Platelet     Status: Abnormal   Collection Time: 12/09/18  3:39 AM  Result Value Ref Range   WBC 11.6 (H) 4.0 - 10.5 K/uL   RBC 2.76 (L) 4.22 - 5.81 MIL/uL   Hemoglobin 7.3 (L) 13.0 - 17.0 g/dL   HCT 22.9 (L) 39.0 -  52.0 %   MCV 83.0 80.0 - 100.0 fL   MCH 26.4 26.0 - 34.0 pg   MCHC 31.9 30.0 - 36.0 g/dL   RDW 15.7 (H) 11.5 - 15.5 %   Platelets 193 150 - 400 K/uL   nRBC 0.0 0.0 - 0.2 %   Neutrophils Relative % 77 %   Neutro Abs 8.9 (H) 1.7 - 7.7 K/uL   Lymphocytes Relative 12 %   Lymphs Abs 1.4 0.7 - 4.0 K/uL   Monocytes Relative 9 %   Monocytes Absolute 1.0 0.1 - 1.0 K/uL   Eosinophils Relative 1 %   Eosinophils Absolute 0.1 0.0 - 0.5 K/uL   Basophils Relative 0 %   Basophils Absolute 0.0 0.0 - 0.1 K/uL   Immature Granulocytes 1 %   Abs Immature Granulocytes 0.10 (H) 0.00 - 0.07 K/uL  Basic metabolic panel     Status: Abnormal   Collection Time: 12/09/18  3:39 AM  Result Value Ref Range   Sodium 133 (L) 135 - 145 mmol/L   Potassium 4.1 3.5 - 5.1 mmol/L   Chloride 105 98 - 111 mmol/L   CO2 14 (L) 22 - 32 mmol/L   Glucose, Bld 164 (H) 70 - 99 mg/dL   BUN 83 (H) 6 - 20 mg/dL   Creatinine, Ser 8.33 (H) 0.61 - 1.24 mg/dL   Calcium 6.4 (LL) 8.9 - 10.3 mg/dL   GFR calc non Af Amer 7 (L) >60 mL/min   GFR calc Af Amer 8 (L) >60 mL/min   Anion gap 14 5 - 15  Magnesium     Status: None   Collection Time: 12/09/18  3:39 AM  Result Value Ref Range   Magnesium 2.0 1.7 - 2.4 mg/dL   Imaging Studies: Dg Chest 2 View  Result Date: 12/09/2018 CLINICAL DATA:  Increased cough and shortness of breath. COPD. EXAM: CHEST - 2 VIEW COMPARISON:  01/23/2017. FINDINGS: Normal sized heart. Clear lungs. The lungs remain hyperexpanded with mild peribronchial thickening. Cervical spine fixation hardware. Mild scoliosis. IMPRESSION: Stable changes of COPD and chronic bronchitis. No acute abnormality. Electronically Signed   By: Claudie Revering M.D.   On: 12/09/2018 03:07   Dg Humerus Right  Result Date: 12/09/2018 CLINICAL DATA:  47 year old male with reported lump in the right mid arm. EXAM: RIGHT HUMERUS - 2+ VIEW COMPARISON:  Right shoulder radiograph dated 09/22/2018 FINDINGS: There is an area of periosteal  thickening in the mid right humeral diaphysis which is not evaluated on this radiograph but may be related to chronic infection or stress. Further evaluation with MRI on a nonemergent basis recommended. There is no acute fracture or dislocation. There is elevated appearance of the humeral head in relation to the glenoid which may be partly positional but is concerning for chronic rotator cuff injury. Evaluation of the shoulder  is limited on this radiograph. If there is pain over the shoulder dedicated shoulder radiograph is recommended for better evaluation. The soft tissues are unremarkable. IMPRESSION: 1. No acute fracture or dislocation. 2. Focal area of periosteal thickening in the mid right humeral diaphysis possibly related to chronic infection or stress. Further evaluation with MRI on a nonemergent basis recommended. 3. Elevation of the humeral head in relation to the glenoid, concerning for chronic rotator cuff injury. If there is pain over the right shoulder dedicated radiograph of the shoulder recommended for further evaluation. Electronically Signed   By: Anner Crete M.D.   On: 12/09/2018 03:17    ED COURSE and MDM  Nursing notes and initial vitals signs, including pulse oximetry, reviewed.  Vitals:   12/09/18 0343 12/09/18 0345 12/09/18 0422 12/09/18 0448  BP:    137/63  Pulse:  (!) 106 (!) 119 (!) 122  Resp:    14  Temp:      TempSrc:      SpO2: 96% 96% 94% 94%  Weight:      Height:       4:32 AM Patient still dyspneic with bilateral expiratory wheezes after 1 hour continuous neb treatment, Solu-Medrol IV and magnesium sulfate IV.    4:42 AM Patient also noted to be in renal failure and to be significantly anemic.  Patient not aware of a history of renal insufficiency or anemia.  We will administer calcium gluconate 1 g IV and check serum magnesium level.  4:58 AM Dr. Alcario Drought accepts for transfer to [facility] hospitalist service.  PROCEDURES   CRITICAL CARE Performed  by: Karen Chafe Lynell Greenhouse Total critical care time: 35 minutes Critical care time was exclusive of separately billable procedures and treating other patients. Critical care was necessary to treat or prevent imminent or life-threatening deterioration. Critical care was time spent personally by me on the following activities: development of treatment plan with patient and/or surrogate as well as nursing, discussions with consultants, evaluation of patient's response to treatment, examination of hospitalist patient, obtaining history from patient or surrogate, ordering and performing treatments and interventions, ordering and review of laboratory studies, ordering and review of radiographic studies, pulse oximetry and re-evaluation of patient's condition.   ED DIAGNOSES     ICD-10-CM   1. COPD exacerbation ([place]) J44.1   2. Anemia due to stage 5 chronic kidney disease, not on chronic dialysis ([place]) N18.5    D63.1   3. Renal failure, unspecified chronicity N19   4. Hypocalcemia E83.51        Aylah Yeary, Jenny Reichmann, MD 12/09/18 [id number]

## 2018-12-09 NOTE — ED Notes (Signed)
Tye [place] (mother) - [phone]  Thomasena Edis( Son) - [phone]  Timmothy Sours ([place]) - [phone]

## 2018-12-09 NOTE — ED Notes (Signed)
Patient transported to X-ray 

## 2018-12-09 NOTE — ED Triage Notes (Signed)
Pt states he is having increase SOB with persistent cough, hx of COPD.

## 2018-12-09 NOTE — Consult Note (Signed)
Referring Provider: No ref. provider found Primary Care Physician:  [facility] [place] Primary Nephrologist:     Reason for Consultation: Acute kidney injury versus chronic renal failure,  HPI: This is a very pleasant 47 year old gentleman who is originally from [place].  He currently lives in [place].  He presented to urgent care  [place] ED with a short history of cough and shortness of breath.  He also had a history of rotator cuff injury about 3 months ago while working on a vehicle.  He has been having right-sided shoulder pain and takes 800 mg ibuprofen every 4 hours around-the-clock.  He has a history of hepatitis C and cirrhosis with a history of hepatitis C treatment.  He states that he was advised not to take Tylenol at that time.  He does have a history of polysubstance abuse and his daughter-in-law is in the room with him her name is Luvenia Starch, she reports that he has been using substances and is concerned about the use of IV heroin.  He has been given IV fluids at 125 cc/h.  He is also being treated with prednisone and albuterol for COPD exacerbation.  He was also found to be anemic and is being transfused 2 units packed red blood cells.  His past history includes a history of emphysema with a long history of cigarette smoking, he also has a history of orchiectomy in the past from testicular cancer.  He also describes joint pain and has required a total knee replacement and back surgery although we do not have records of this.  Right humerus x-ray shows no fracture or dislocation periosteal thickening in the mid right humerus related to chronic infection or stress elevation of the humeral head in relation to the glenoid suggesting chronic rotator cuff injury  Blood pressure 132/75 pulse 93 temperature 97.8 O2 sats 98% room air  Sodium 136 potassium 4.9 chloride 105 CO2 12 BUN 89 creatinine 8.59 glucose 254 calcium 6.8 iron saturation is 9% WBC 8.1  hemoglobin 7.4 platelets 194  Past Medical History:  Diagnosis Date  . Emphysema lung ([place])   . Testicular cancer [facility])     Past Surgical History:  Procedure Laterality Date  . HAND SURGERY    . NECK SURGERY    . REPLACEMENT TOTAL KNEE    . testicle removed      Prior to Admission medications   Medication Sig Start Date End Date Taking? Authorizing Provider  acetaminophen (TYLENOL) 500 MG tablet Take 1,000 mg by mouth every 6 (six) hours as needed.    [provider]  albuterol (PROVENTIL HFA;VENTOLIN HFA) 108 (90 Base) MCG/ACT inhaler Inhale into the lungs every 6 (six) hours as needed for wheezing or shortness of breath.    [provider]  albuterol (PROVENTIL) (2.5 MG/3ML) 0.083% nebulizer solution Take 2.5 mg by nebulization every 6 (six) hours as needed for wheezing or shortness of breath.    [provider]  diclofenac (VOLTAREN) 75 MG EC tablet Take 1 tablet (75 mg total) by mouth 2 (two) times daily. 09/22/18   Fransico Meadow, PA-C  methocarbamol (ROBAXIN) 500 MG tablet Take 1 tablet (500 mg total) by mouth 2 (two) times daily. 09/22/18   Fransico Meadow, PA-C    Current Facility-Administered Medications  Medication Dose Route Frequency Provider Last Rate Last Dose  . 0.9 %  sodium chloride infusion (Manually program via Guardrails IV Fluids)   Intravenous Once Truett Mainland, DO      .  0.9 %  sodium chloride infusion   Intravenous PRN Truett Mainland, DO 20 mL/hr at 12/09/18 0346    . 0.9 %  sodium chloride infusion   Intravenous Continuous Truett Mainland, DO 125 mL/hr at 12/09/18 1620    . acetaminophen (TYLENOL) tablet 650 mg  650 mg Oral Q6H PRN Truett Mainland, DO       Or  . acetaminophen (TYLENOL) suppository 650 mg  650 mg Rectal Q6H PRN Truett Mainland, DO      . albuterol (PROVENTIL) (2.5 MG/3ML) 0.083% nebulizer solution 2.5 mg  2.5 mg Nebulization Q6H PRN Truett Mainland, DO      . diphenhydrAMINE (BENADRYL) capsule 25 mg  25 mg  Oral Once Stinson, Jacob J, DO      . famotidine (PEPCID) tablet 20 mg  20 mg Oral BID Stinson, Jacob J, DO      . feeding supplement (ENSURE ENLIVE) (ENSURE ENLIVE) liquid 237 mL  237 mL Oral BID BM Stinson, Jacob J, DO      . methocarbamol (ROBAXIN) tablet 500 mg  500 mg Oral BID Stinson, Jacob J, DO      . ondansetron [facility]) tablet 4 mg  4 mg Oral Q6H PRN Truett Mainland, DO       Or  . ondansetron (ZOFRAN) injection 4 mg  4 mg Intravenous Q6H PRN Truett Mainland, DO      . polyethylene glycol (MIRALAX / GLYCOLAX) packet 17 g  17 g Oral Daily PRN Truett Mainland, DO      . [START ON 12/10/2018] predniSONE (DELTASONE) tablet 40 mg  40 mg Oral Q breakfast Stinson, Jacob J, DO      . traMADol Veatrice Bourbon) tablet 50 mg  50 mg Oral Q6H PRN Truett Mainland, DO        Allergies as of 12/09/2018  . (No Known Allergies)    Family History  Problem Relation Age of Onset  . Cancer Father     Social History   Socioeconomic History  . Marital status: Single    Spouse name: Not on file  . Number of children: Not on file  . Years of education: Not on file  . Highest education level: Not on file  Occupational History  . Not on file  Social Needs  . Financial resource strain: Not on file  . Food insecurity:    Worry: Not on file    Inability: Not on file  . Transportation needs:    Medical: Not on file    Non-medical: Not on file  Tobacco Use  . Smoking status: Current Every Day Smoker  . Smokeless tobacco: Never Used  Substance and Sexual Activity  . Alcohol use: Not Currently    Frequency: Never  . Drug use: Never  . Sexual activity: Not on file  Lifestyle  . Physical activity:    Days per week: Not on file    Minutes per session: Not on file  . Stress: Not on file  Relationships  . Social connections:    Talks on phone: Not on file    Gets together: Not on file    Attends religious service: Not on file    Active member of club or organization: Not on file    Attends  meetings of clubs or organizations: Not on file    Relationship status: Not on file  . Intimate partner violence:    Fear of current or ex partner: Not on file  Emotionally abused: Not on file    Physically abused: Not on file    Forced sexual activity: Not on file  Other Topics Concern  . Not on file  Social History Narrative  . Not on file    Review of Systems: Gen: Weak no fever sweats or chills dyspneic to conversation sitting upright in bed HEENT: No visual complaints, No history of Retinopathy. Normal external appearance No Epistaxis or Sore throat. No sinusitis.   CV: Denies chest pain, angina, palpitations, syncope, orthopnea, PND, peripheral edema, and claudication. Resp: Cough with yellow/green sputum production GI: Denies vomiting blood, jaundice, and fecal incontinence.   Denies dysphagia or odynophagia. GU : Denies urinary burning, blood in urine, urinary frequency, urinary hesitancy, nocturnal urination, and urinary incontinence.  No renal calculi. MS: Right shoulder pain. Derm: Denies rash, itching, dry skin, hives, moles, warts, or unhealing ulcers.  Psych: Denies depression, anxiety, memory loss, suicidal ideation, hallucinations, paranoia, and confusion. Heme: Denies bruising, bleeding, and enlarged lymph nodes. Neuro: No headache.  No diplopia. No dysarthria.  No dysphasia.  No history of CVA.  No Seizures. No paresthesias.  No weakness. Endocrine No DM.  No Thyroid disease.  No Adrenal disease.  Physical Exam: Vital signs in last 24 hours: Temp:  [97.6 F (36.4 C)-97.8 F (36.6 C)] 97.8 F (36.6 C) (12/13 1320) Pulse Rate:  [93-122] 93 (12/13 1320) Resp:  [10-21] 18 (12/13 1320) BP: (112-141)/(63-75) 132/75 (12/13 1320) SpO2:  [93 %-99 %] 98 % (12/13 1320) FiO2 (%):  [21 %] 21 % (12/13 0343) Weight:  [70.3 kg] 70.3 kg (12/13 0221)   General:   Cachectic appearing gentleman appears older than stated age.  He is using accessory muscles of  respiration. Head:  Normocephalic and atraumatic. Eyes:  Sclera clear, no icterus.   Pale conjunctival membranes Ears:  Normal auditory acuity. Nose:  No deformity, discharge,  or lesions. Mouth:  No deformity or lesions, dentition normal.  Tongue moist no fissures cracks or coating Neck:  Supple; no masses or thyromegaly. JVP not elevated Lungs: Multiple wheezes heard throughout lung fields on expiration no crackles Heart:  Regular rate and rhythm; no murmurs, clicks, rubs,  or gallops. Abdomen:  Soft, nontender and nondistended. No masses, hepatosplenomegaly or hernias noted. Normal bowel sounds, without guarding, and without rebound.   Msk:  Symmetrical without gross deformities. Normal posture. Pulses:  No carotid, renal, femoral bruits. DP and PT symmetrical and equal Extremities:  Without clubbing or edema. Neurologic:  Alert and  oriented x4;  grossly normal neurologically. Skin:  Intact without significant lesions or rashes.   Intake/Output from previous day: 12/12 0701 - 12/13 0700 In: 99.9 [IV Piggyback:99.9] Out: -  Intake/Output this shift: No intake/output data recorded.  Lab Results: Recent Labs    12/09/18 0339 12/09/18 1502  WBC 11.6* 8.1  HGB 7.3* 7.4*  HCT 22.9* 23.3*  PLT 193 194   BMET Recent Labs    12/09/18 0339 12/09/18 1502  NA 133* 136  K 4.1 4.9  CL 105 105  CO2 14* 12*  GLUCOSE 164* 254*  BUN 83* 89*  CREATININE 8.33* 8.59*  CALCIUM 6.4* 6.8*   LFT No results for input(s): PROT, ALBUMIN, AST, ALT, ALKPHOS, BILITOT, BILIDIR, IBILI in the last 72 hours. PT/INR No results for input(s): LABPROT, INR in the last 72 hours. Hepatitis Panel No results for input(s): HEPBSAG, HCVAB, HEPAIGM, HEPBIGM in the last 72 hours.  Studies/Results: Dg Chest 2 View  Result Date: 12/09/2018 CLINICAL DATA:  Increased cough and shortness of breath. COPD. EXAM: CHEST - 2 VIEW COMPARISON:  01/23/2017. FINDINGS: Normal sized heart. Clear lungs. The lungs  remain hyperexpanded with mild peribronchial thickening. Cervical spine fixation hardware. Mild scoliosis. IMPRESSION: Stable changes of COPD and chronic bronchitis. No acute abnormality. Electronically Signed   By: Claudie Revering M.D.   On: 12/09/2018 03:07   US Renal  Result Date: 12/09/2018 CLINICAL DATA:  Acute renal failure EXAM: RENAL / URINARY TRACT ULTRASOUND COMPLETE COMPARISON:  None. FINDINGS: Right Kidney: Renal measurements: 13.2 x 5.6 x 7.9 cm = volume: 307 mL. Parenchyma is echogenic compared to the adjacent liver. No focal lesion or hydronephrosis. Left Kidney: Renal measurements: 13.5 x 6.6 x 6.7 cm = volume: 313 mL. Echogenicity within normal limits. No mass or hydronephrosis visualized. Bladder: Physiologically distended. Focal area of smooth wall thickening posteriorly measuring 2.7 x 1.3 x 4 cm. Incidental note made of splenomegaly measuring at least 14.4 cm length. IMPRESSION: 1. No hydronephrosis. 2. Echogenic renal parenchyma, a nonspecific indicator of medical renal disease 3. Focal wall thickening in the urinary bladder. Consider urologic evaluation. 4. Splenomegaly Electronically Signed   By: Lucrezia Europe M.D.   On: 12/09/2018 16:19   Dg Humerus Right  Result Date: 12/09/2018 CLINICAL DATA:  47 year old male with reported lump in the right mid arm. EXAM: RIGHT HUMERUS - 2+ VIEW COMPARISON:  Right shoulder radiograph dated 09/22/2018 FINDINGS: There is an area of periosteal thickening in the mid right humeral diaphysis which is not evaluated on this radiograph but may be related to chronic infection or stress. Further evaluation with MRI on a nonemergent basis recommended. There is no acute fracture or dislocation. There is elevated appearance of the humeral head in relation to the glenoid which may be partly positional but is concerning for chronic rotator cuff injury. Evaluation of the shoulder is limited on this radiograph. If there is pain over the shoulder dedicated shoulder  radiograph is recommended for better evaluation. The soft tissues are unremarkable. IMPRESSION: 1. No acute fracture or dislocation. 2. Focal area of periosteal thickening in the mid right humeral diaphysis possibly related to chronic infection or stress. Further evaluation with MRI on a nonemergent basis recommended. 3. Elevation of the humeral head in relation to the glenoid, concerning for chronic rotator cuff injury. If there is pain over the right shoulder dedicated radiograph of the shoulder recommended for further evaluation. Electronically Signed   By: Anner Crete M.D.   On: 12/09/2018 03:17    Assessment/Plan:  Kidney failure.  Is difficult to know whether this is acute or chronic.  Renal ultrasound was suggested normal size kidneys although he does have a 13.2 cm kidney on the right and the 13.5 cm kidney on the left.  He has splenomegaly measuring 14.4 cm no hydronephrosis and focal wall thickening of the urinary bladder.  I would avoid nephrotoxins, ACE inhibitors ARB's IV contrast Cox 2 inhibitors and nonsteroidal anti-inflammatory drugs.  Will maintain fluid homeostasis and avoid overhydration.  Agree with urinalysis microscopy SPEP and urine immunofixation.  May need to proceed with renal biopsy if cause remains elusive  Metabolic acidosis will replete with IV fluids 3 amps of sodium bicarbonate in D5W at rate of 125 cc an hour.  Anemia appears iron deficient.  Received blood.  Will need GI evaluation.  Check stools  Bones will check PTH  Hyperglycemia suggestive of diabetes blood sugars 1 60-250 recommend sliding scale insulin and blood sugar monitoring.  Will check hemoglobin A1c  Hepatitis C previous treatment  Cirrhosis with splenomegaly appears compensated at this time  History of substance abuse recommend urine drug screen serum tox screen.  Hypertension/volume is stable at this point  COPD exacerbation recommend nebs, oxygen, prednisone and antibiotics.   LOS:  0 Sherril Croon @TODAY @5 :34 PM

## 2018-12-10 ENCOUNTER — Encounter (HOSPITAL_COMMUNITY): Payer: Self-pay | Admitting: *Deleted

## 2018-12-10 ENCOUNTER — Other Ambulatory Visit: Payer: Self-pay

## 2018-12-10 ENCOUNTER — Inpatient Hospital Stay (HOSPITAL_COMMUNITY)
Admission: EM | Admit: 2018-12-10 | Discharge: 2018-12-12 | DRG: 683 | Discharge status: Against Medical Advice | Payer: Medicare Other | Attending: Internal Medicine | Admitting: Internal Medicine

## 2018-12-10 DIAGNOSIS — N179 Acute kidney failure, unspecified: Secondary | ICD-10-CM | POA: Diagnosis not present

## 2018-12-10 DIAGNOSIS — Z809 Family history of malignant neoplasm, unspecified: Secondary | ICD-10-CM

## 2018-12-10 DIAGNOSIS — B1920 Unspecified viral hepatitis C without hepatic coma: Secondary | ICD-10-CM | POA: Diagnosis present

## 2018-12-10 DIAGNOSIS — F17210 Nicotine dependence, cigarettes, uncomplicated: Secondary | ICD-10-CM | POA: Diagnosis present

## 2018-12-10 DIAGNOSIS — N189 Chronic kidney disease, unspecified: Secondary | ICD-10-CM

## 2018-12-10 DIAGNOSIS — E872 Acidosis: Secondary | ICD-10-CM | POA: Diagnosis present

## 2018-12-10 DIAGNOSIS — E875 Hyperkalemia: Secondary | ICD-10-CM | POA: Diagnosis present

## 2018-12-10 DIAGNOSIS — K7460 Unspecified cirrhosis of liver: Secondary | ICD-10-CM | POA: Diagnosis present

## 2018-12-10 DIAGNOSIS — Z96659 Presence of unspecified artificial knee joint: Secondary | ICD-10-CM | POA: Diagnosis present

## 2018-12-10 DIAGNOSIS — I10 Essential (primary) hypertension: Secondary | ICD-10-CM | POA: Diagnosis present

## 2018-12-10 DIAGNOSIS — J439 Emphysema, unspecified: Secondary | ICD-10-CM | POA: Diagnosis present

## 2018-12-10 DIAGNOSIS — S46001A Unspecified injury of muscle(s) and tendon(s) of the rotator cuff of right shoulder, initial encounter: Secondary | ICD-10-CM | POA: Diagnosis present

## 2018-12-10 DIAGNOSIS — M75100 Unspecified rotator cuff tear or rupture of unspecified shoulder, not specified as traumatic: Secondary | ICD-10-CM | POA: Diagnosis present

## 2018-12-10 DIAGNOSIS — K7469 Other cirrhosis of liver: Secondary | ICD-10-CM

## 2018-12-10 DIAGNOSIS — E440 Moderate protein-calorie malnutrition: Secondary | ICD-10-CM | POA: Diagnosis present

## 2018-12-10 DIAGNOSIS — Z8547 Personal history of malignant neoplasm of testis: Secondary | ICD-10-CM

## 2018-12-10 DIAGNOSIS — J441 Chronic obstructive pulmonary disease with (acute) exacerbation: Secondary | ICD-10-CM | POA: Diagnosis present

## 2018-12-10 DIAGNOSIS — R0600 Dyspnea, unspecified: Secondary | ICD-10-CM | POA: Diagnosis not present

## 2018-12-10 DIAGNOSIS — Z6821 Body mass index (BMI) 21.0-21.9, adult: Secondary | ICD-10-CM

## 2018-12-10 DIAGNOSIS — D649 Anemia, unspecified: Secondary | ICD-10-CM | POA: Diagnosis present

## 2018-12-10 DIAGNOSIS — Z8619 Personal history of other infectious and parasitic diseases: Secondary | ICD-10-CM | POA: Diagnosis present

## 2018-12-10 LAB — BASIC METABOLIC PANEL
Anion gap: 16 — ABNORMAL HIGH (ref 5–15)
BUN: 110 mg/dL — ABNORMAL HIGH (ref 6–20)
CO2: 14 mmol/L — ABNORMAL LOW (ref 22–32)
Calcium: 6.7 mg/dL — ABNORMAL LOW (ref 8.9–10.3)
Chloride: 109 mmol/L (ref 98–111)
Creatinine, Ser: 8.27 mg/dL — ABNORMAL HIGH (ref 0.61–1.24)
GFR calc Af Amer: 8 mL/min — ABNORMAL LOW (ref >60)
GFR calc non Af Amer: 7 mL/min — ABNORMAL LOW (ref >60)
Glucose, Bld: 174 mg/dL — ABNORMAL HIGH (ref 70–99)
POTASSIUM: 5.8 mmol/L — AB (ref 3.5–5.1)
Sodium: 139 mmol/L (ref 135–145)

## 2018-12-10 LAB — CBC
HCT: 26.2 % — ABNORMAL LOW (ref 39.0–52.0)
Hemoglobin: 8.5 g/dL — ABNORMAL LOW (ref 13.0–17.0)
MCH: 26.8 pg (ref 26.0–34.0)
MCHC: 32.4 g/dL (ref 30.0–36.0)
MCV: 82.6 fL (ref 80.0–100.0)
Platelets: 197 10*3/uL (ref 150–400)
RBC: 3.17 MIL/uL — AB (ref 4.22–5.81)
RDW: 15.6 % — ABNORMAL HIGH (ref 11.5–15.5)
WBC: 13.3 10*3/uL — ABNORMAL HIGH (ref 4.0–10.5)
nRBC: 0 % (ref 0.0–0.2)

## 2018-12-10 LAB — COMPREHENSIVE METABOLIC PANEL
ALT: 12 U/L (ref 0–44)
AST: 15 U/L (ref 15–41)
Albumin: 2.2 g/dL — ABNORMAL LOW (ref 3.5–5.0)
Alkaline Phosphatase: 98 U/L (ref 38–126)
Anion gap: 17 — ABNORMAL HIGH (ref 5–15)
BUN: 116 mg/dL — ABNORMAL HIGH (ref 6–20)
CO2: 16 mmol/L — ABNORMAL LOW (ref 22–32)
CREATININE: 7.97 mg/dL — AB (ref 0.61–1.24)
Calcium: 6.9 mg/dL — ABNORMAL LOW (ref 8.9–10.3)
Chloride: 105 mmol/L (ref 98–111)
GFR calc Af Amer: 8 mL/min — ABNORMAL LOW (ref >60)
GFR calc non Af Amer: 7 mL/min — ABNORMAL LOW (ref >60)
Glucose, Bld: 180 mg/dL — ABNORMAL HIGH (ref 70–99)
Potassium: 5.5 mmol/L — ABNORMAL HIGH (ref 3.5–5.1)
Sodium: 138 mmol/L (ref 135–145)
Total Bilirubin: 0.5 mg/dL (ref 0.3–1.2)
Total Protein: 7.1 g/dL (ref 6.5–8.1)

## 2018-12-10 LAB — PARATHYROID HORMONE, INTACT (NO CA): PTH: 140 pg/mL — ABNORMAL HIGH (ref 15–65)

## 2018-12-10 LAB — HIV ANTIBODY (ROUTINE TESTING W REFLEX): HIV Screen 4th Generation wRfx: NONREACTIVE

## 2018-12-10 MED ORDER — SODIUM ZIRCONIUM CYCLOSILICATE 10 G PO PACK
10.0000 g | PACK | Freq: Three times a day (TID) | ORAL | Status: DC
  Administered 2018-12-10: 10 g via ORAL
  Filled 2018-12-10 (×2): qty 1

## 2018-12-10 NOTE — Progress Notes (Signed)
PROGRESS NOTE    Ernest Reese   TMH:[id number]  DOB: 1971/12/16  DOA: 12/09/2018 PCP: [name]   Brief Narrative:  Ernest Reese is a 47 y.o. male with a history of emphysema, ongoing smoking, history of testicular cancer who does not follow with a PCP. Patient's presents with shortness of breath & cough that is nonproductive.  He was seen at [facility] ED given steroids and nebulizer treatments with improvement.  Blood work was done which shows acute kidney injury.  Patient has had shoulder injury approximately 3 months ago and has been taking about 6400 mg of ibuprofen daily for the past 3 months.   Subjective: He is breathing better. Has cough but non productive. Urinating well. Right arm hurts. Has seen an orthopedist who he plans to f/u with as outpt.    Assessment & Plan:   Principal Problem:   Acute kidney failure , metabolic acidosis - possibly due to NSAID use - appreciate nephro eval and work up - cont bicarb infusion - renal ultrasound unrevealing  Active Problems: Hyperkalemia -likely related to AMI- on lokelma    COPD with acute exacerbation - smoked 1 ppd - albuterol nebs, Prednisone x 5 days - advised to stop smoking  H/o Hep C treated Cirrhosis- Splenomegaly noted on renal ultrasound    Anemia - anemia panel suggestive of AOCD  - received 2 U PRBC on admission - hemoccult pending  Drug abuse??? - Family thinks he is abusing heroin but he denies this - UDS + for opiates- he states he took an oxycodone from his buddy for his rotator cuff issues   Right shoulder pain/ rotator cuff injury - robaxin & Ultram ordered on admission continue  Glucose intolerance - A1c 6.1       Moderate Malnutrition - Interventions: Ensure Enlive      DVT prophylaxis: SCDs, ambulating Code Status: Full code Family Communication:  Disposition Plan: follow renal function and resp status Consultants:   nephro Procedures:    none Antimicrobials:  Anti-infectives (From admission, onward)   None       Objective: Vitals:   12/10/18 0406 12/10/18 0546 12/10/18 0725 12/10/18 0909  BP: 117/78 115/80 128/78 117/76  Pulse: 92 89 97 86  Resp: 20 18 18 18   Temp: 98.1 F (36.7 C) 97.9 F (36.6 C) 98.8 F (37.1 C) 97.8 F (36.6 C)  TempSrc: Oral Oral Oral Oral  SpO2: 97% 99% 98% 97%  Weight:      Height:        Intake/Output Summary (Last 24 hours) at 12/10/2018 0914 Last data filed at 12/10/2018 0710 Gross per 24 hour  Intake 2647.61 ml  Output 475 ml  Net 2172.61 ml   Filed Weights   12/09/18 0221  Weight: 70.3 kg    Examination: General exam: Appears comfortable  HEENT: PERRLA, oral mucosa moist, no sclera icterus or thrush Respiratory system: mild b/l wheezing - no resp distress Cardiovascular system: S1 & S2 heard, RRR.   Gastrointestinal system: Abdomen soft, non-tender, nondistended. Normal bowel sounds. Central nervous system: Alert and oriented. No focal neurological deficits. Extremities: No cyanosis, clubbing or edema- right arm - unable to lift Skin: No rashes or ulcers Psychiatry:  Mood & affect appropriate.     Data Reviewed: I have personally reviewed following labs and imaging studies  CBC: Recent Labs  Lab 12/09/18 0339 12/09/18 1502  WBC 11.6* 8.1  NEUTROABS 8.9*  --   HGB 7.3* 7.4*  HCT  22.9* 23.3*  MCV 83.0 83.5  PLT 193 841   Basic Metabolic Panel: Recent Labs  Lab 12/09/18 0339 12/09/18 1502  NA 133* 136  K 4.1 4.9  CL 105 105  CO2 14* 12*  GLUCOSE 164* 254*  BUN 83* 89*  CREATININE 8.33* 8.59*  CALCIUM 6.4* 6.8*  MG 2.0  --    GFR: Estimated Creatinine Clearance: 10.7 mL/min (A) (by C-G formula based on SCr of 8.59 mg/dL (H)). Liver Function Tests: No results for input(s): AST, ALT, ALKPHOS, BILITOT, PROT, ALBUMIN in the last 168 hours. No results for input(s): LIPASE, AMYLASE in the last 168 hours. No results for input(s): AMMONIA in the  last 168 hours. Coagulation Profile: No results for input(s): INR, PROTIME in the last 168 hours. Cardiac Enzymes: No results for input(s): CKTOTAL, CKMB, CKMBINDEX, TROPONINI in the last 168 hours. BNP (last 3 results) No results for input(s): PROBNP in the last 8760 hours. HbA1C: Recent Labs    12/09/18 1825  HGBA1C 6.1*   CBG: No results for input(s): GLUCAP in the last 168 hours. Lipid Profile: No results for input(s): CHOL, HDL, LDLCALC, TRIG, CHOLHDL, LDLDIRECT in the last 72 hours. Thyroid Function Tests: No results for input(s): TSH, T4TOTAL, FREET4, T3FREE, THYROIDAB in the last 72 hours. Anemia Panel: Recent Labs    12/09/18 1502  VITAMINB12 701  FOLATE 15.3  FERRITIN 211  TIBC 217*  IRON 19*  RETICCTPCT 0.5   Urine analysis:    Component Value Date/Time   COLORURINE YELLOW 12/09/2018 1425   APPEARANCEUR TURBID (A) 12/09/2018 1425   LABSPEC 1.010 12/09/2018 1425   PHURINE 5.0 12/09/2018 1425   GLUCOSEU 150 (A) 12/09/2018 1425   HGBUR LARGE (A) 12/09/2018 1425   BILIRUBINUR NEGATIVE 12/09/2018 1425   KETONESUR NEGATIVE 12/09/2018 1425   PROTEINUR 100 (A) 12/09/2018 1425   NITRITE POSITIVE (A) 12/09/2018 1425   LEUKOCYTESUR LARGE (A) 12/09/2018 1425   Sepsis Labs: @LABRCNTIP (procalcitonin:4,lacticidven:4) )No results found for this or any previous visit (from the past 240 hour(s)).       Radiology Studies: Dg Chest 2 View  Result Date: 12/09/2018 CLINICAL DATA:  Increased cough and shortness of breath. COPD. EXAM: CHEST - 2 VIEW COMPARISON:  01/23/2017. FINDINGS: Normal sized heart. Clear lungs. The lungs remain hyperexpanded with mild peribronchial thickening. Cervical spine fixation hardware. Mild scoliosis. IMPRESSION: Stable changes of COPD and chronic bronchitis. No acute abnormality. Electronically Signed   By: Claudie Revering M.D.   On: 12/09/2018 03:07   US Renal  Result Date: 12/09/2018 CLINICAL DATA:  Acute renal failure EXAM: RENAL /  URINARY TRACT ULTRASOUND COMPLETE COMPARISON:  None. FINDINGS: Right Kidney: Renal measurements: 13.2 x 5.6 x 7.9 cm = volume: 307 mL. Parenchyma is echogenic compared to the adjacent liver. No focal lesion or hydronephrosis. Left Kidney: Renal measurements: 13.5 x 6.6 x 6.7 cm = volume: 313 mL. Echogenicity within normal limits. No mass or hydronephrosis visualized. Bladder: Physiologically distended. Focal area of smooth wall thickening posteriorly measuring 2.7 x 1.3 x 4 cm. Incidental note made of splenomegaly measuring at least 14.4 cm length. IMPRESSION: 1. No hydronephrosis. 2. Echogenic renal parenchyma, a nonspecific indicator of medical renal disease 3. Focal wall thickening in the urinary bladder. Consider urologic evaluation. 4. Splenomegaly Electronically Signed   By: Lucrezia Europe M.D.   On: 12/09/2018 16:19   Dg Humerus Right  Result Date: 12/09/2018 CLINICAL DATA:  46 year old male with reported lump in the right mid arm. EXAM: RIGHT HUMERUS - 2+  VIEW COMPARISON:  Right shoulder radiograph dated 09/22/2018 FINDINGS: There is an area of periosteal thickening in the mid right humeral diaphysis which is not evaluated on this radiograph but may be related to chronic infection or stress. Further evaluation with MRI on a nonemergent basis recommended. There is no acute fracture or dislocation. There is elevated appearance of the humeral head in relation to the glenoid which may be partly positional but is concerning for chronic rotator cuff injury. Evaluation of the shoulder is limited on this radiograph. If there is pain over the shoulder dedicated shoulder radiograph is recommended for better evaluation. The soft tissues are unremarkable. IMPRESSION: 1. No acute fracture or dislocation. 2. Focal area of periosteal thickening in the mid right humeral diaphysis possibly related to chronic infection or stress. Further evaluation with MRI on a nonemergent basis recommended. 3. Elevation of the humeral head  in relation to the glenoid, concerning for chronic rotator cuff injury. If there is pain over the right shoulder dedicated radiograph of the shoulder recommended for further evaluation. Electronically Signed   By: Anner Crete M.D.   On: 12/09/2018 03:17      Scheduled Meds: . sodium chloride   Intravenous Once  . diphenhydrAMINE  25 mg Oral Once  . famotidine  20 mg Oral QHS  . feeding supplement (ENSURE ENLIVE)  237 mL Oral BID BM  . methocarbamol  500 mg Oral BID  . predniSONE  40 mg Oral Q breakfast   Continuous Infusions: . sodium chloride 20 mL/hr at 12/09/18 0346  . sodium chloride 125 mL/hr at 12/09/18 1620  .  sodium bicarbonate (isotonic) infusion in sterile water 125 mL/hr at 12/09/18 1947     LOS: 1 day    Time spent in minutes: 35    Debbe Odea, MD [facility] Pager: [email or web address] Password [facility] 12/10/2018, 9:14 AM

## 2018-12-10 NOTE — Progress Notes (Signed)
Pt very persistent about wanting to go outside for 5 minutes. This RN and previous RN informed pt that there is not an order to go off of the unit and that due to pt being very SOB while sitting that PT did not recommend ambulating off the unit. Daughter also stated to this RN that pt is an active drug user and to not let pt off the unit. Daughter also stated that she did not want pt to leave with her and that pt is stating he was. Security was called by charge nurse due to daughter stating she did not want pt to leave with her and her infant baby. Pt ripped IV out and would not sign AMA papers. Pt dripped blood down the hall and packed up belongings. Daughter and her husband ended up taking pt with them. MD was informed and stated that pt could not be IVC, which daughter was requesting, due to being alert and oriented. Pt did leave with daughter.    Eleanora Neighbor, RN

## 2018-12-10 NOTE — ED Triage Notes (Addendum)
Pt went to hospital the other night and has emphysema and COPD.  Got sick with congested cough and that resolved.  Then his kidneys were not working well.  Pt went home from hospital tonight then he got home tonight and his daughter wanted him to come back. Daughter wants a note saying the patient is okay to come home and he feels improved

## 2018-12-10 NOTE — Progress Notes (Signed)
OT Cancellation/Discharge    12/10/18 1252  OT Visit Information  Last OT Received On 12/10/18  Reason Eval/Treat Not Completed OT screened, no needs identified, will sign off  Maurie Boettcher, OT/L   Acute OT Clinical Specialist [place] Pager [phone] Office [phone]

## 2018-12-10 NOTE — Evaluation (Addendum)
Physical Therapy Evaluation and Discharge Patient Details Name: Ernest Reese MRN: [id number] DOB: Dec 22, 1971 Today's Date: 12/10/2018   History of Present Illness  Pt is a 47 y/o male who presents with increasing SOB and cough. He has a 3 month history of R rotator cuff injury while working on a vehicle. He otherwise does not work and is on disability. PMH significant for IV drug use, emphysema, testicular CA, TKR, back surgery. He is admitted for acute kidney failure, COPD with acute exacerbation, anemia.   Clinical Impression  Patient evaluated by Physical Therapy with no further acute PT needs identified. All education has been completed and the patient has no further questions. At the time of PT eval, pt demonstrating transfers and ambulation at a modified independent level. Pt ambulated on RA ~700 feet without an AD. He took 1 standing rest break halfway due to significant DOE. Pt insistent that this is his baseline. Upon return to the room O2 sats at 98% and HR 92 bpm. Pt getting somewhat agitated by end of session as he wants to go outside to get some fresh air. It was explained to the pt that there must be an MD order for pt to leave the unit to go outside. I personally do not feel comfortable escorting the pt outside but the RN was notified. If pt is granted permission to go out, would recommend a wheelchair due to significant DOE after ambulating ~350'. See below for any follow-up Physical Therapy or equipment needs. PT is signing off as pt is mobilizing at a mod I level and is safe to ambulate the halls without PT present. Thank you for this referral.     Follow Up Recommendations Supervision - Intermittent; Cardiac/pulmonary rehab    Equipment Recommendations  None recommended by PT    Recommendations for Other Services       Precautions / Restrictions Precautions Precautions: None Restrictions Weight Bearing Restrictions: No      Mobility  Bed Mobility                General bed mobility comments: Pt sitting up on EOB when PT arrived.   Transfers Overall transfer level: Modified independent Equipment used: None             General transfer comment: No assist required. Pt appeared steady.   Ambulation/Gait Ambulation/Gait assistance: Modified independent (Device/Increase time) Gait Distance (Feet): 700 Feet Assistive device: None Gait Pattern/deviations: WFL(Within Functional Limits) Gait velocity: Decreased Gait velocity interpretation: >2.62 ft/sec, indicative of community ambulatory General Gait Details: Pt ambulating without an AD well. No unsteadiness or LOB noted. Pt talking throughout the entirety of gait training and became dyspneic. Reports it is no worse than normal.   Stairs            Wheelchair Mobility    Modified Rankin (Stroke Patients Only)       Balance Overall balance assessment: Mild deficits observed, not formally tested                                           Pertinent Vitals/Pain Pain Assessment: No/denies pain    Home Living Family/patient expects to be discharged to:: Private residence Living Arrangements: Non-relatives/Friends Available Help at Discharge: Friend(s);Available 24 hours/day Type of Home: House       Home Layout: One level Home Equipment: None      Prior Function  Level of Independence: Independent         Comments: Pt reports he "piddles" around with his friends - works on cars, Social research officer, government.      Journalist, newspaper        Extremity/Trunk Assessment   Upper Extremity Assessment Upper Extremity Assessment: RUE deficits/detail RUE Deficits / Details: Decreased strength and AROM consistent with rotator cuff injury    Lower Extremity Assessment Lower Extremity Assessment: Overall WFL for tasks assessed    Cervical / Trunk Assessment Cervical / Trunk Assessment: Normal  Communication   Communication: No difficulties  Cognition Arousal/Alertness:  Awake/alert Behavior During Therapy: Anxious Overall Cognitive Status: Impaired/Different from baseline Area of Impairment: Safety/judgement                         Safety/Judgement: Decreased awareness of safety;Decreased awareness of deficits     General Comments: Pt demanding to go outside to get some fresh air. Getting somewhat agitated that he is not immediately taken out as he requests and is threatening to put his shoes on and go anyway. RN aware and will contact MD regarding this.       General Comments      Exercises     Assessment/Plan    PT Assessment Patent does not need any further PT services  PT Problem List         PT Treatment Interventions      PT Goals (Current goals can be found in the Care Plan section)  Acute Rehab PT Goals Patient Stated Goal: Go outside to get some fresh air PT Goal Formulation: All assessment and education complete, DC therapy    Frequency     Barriers to discharge        Co-evaluation               AM-PAC PT "6 Clicks" Mobility  Outcome Measure Help needed turning from your back to your side while in a flat bed without using bedrails?: None Help needed moving from lying on your back to sitting on the side of a flat bed without using bedrails?: None Help needed moving to and from a bed to a chair (including a wheelchair)?: None Help needed standing up from a chair using your arms (e.g., wheelchair or bedside chair)?: None Help needed to walk in hospital room?: None Help needed climbing 3-5 steps with a railing? : None 6 Click Score: 24    End of Session   Activity Tolerance: Patient tolerated treatment well Patient left: with call bell/phone within reach;Other (comment)(Sitting EOB eating lunch) Nurse Communication: Mobility status PT Visit Diagnosis: Other (comment)(Cardiopulmonary symptoms)    Time: 4970-2637 PT Time Calculation (min) (ACUTE ONLY): 21 min   Charges:   PT Evaluation $PT Eval  Moderate Complexity: 1 Mod          Rolinda Roan, PT, DPT Acute Rehabilitation Services Pager: [phone] Office: [phone]   Thelma Comp 12/10/2018, 12:25 PM

## 2018-12-10 NOTE — Progress Notes (Signed)
[facility] ROUNDING NOTE   Subjective:   Appears to be much improved this morning less dyspnea  Blood pressure 117/76 pulse 86 temperature 97.8 urine output 475 cc 12/09/2018.  2 units packed red blood cells transfused 12/09/2018.  Sodium 139 potassium 5.8 chloride 109 CO2 14 glucose 175 BUN 110 creatinine 8.27 calcium 6.7 magnesium 2.0 iron saturations 9% WBC 13.3 hemoglobin 8.5 platelets 197  Urinalysis turbid appearing urine large blood 100 mg/dL protein.  HIV screen nonreactive PTH 140     Objective:  Vital signs in last 24 hours:  Temp:  [97.8 F (36.6 C)-98.8 F (37.1 C)] 97.8 F (36.6 C) (12/14 0909) Pulse Rate:  [86-105] 86 (12/14 0909) Resp:  [16-20] 18 (12/14 0909) BP: (112-132)/(73-80) 117/76 (12/14 0909) SpO2:  [95 %-99 %] 97 % (12/14 0909)  Weight change:  Filed Weights   12/09/18 0221  Weight: 70.3 kg    Intake/Output: I/O last 3 completed shifts: In: 2349.5 [I.V.:238.1; Blood:1030; IV Piggyback:1081.4] Out: 475 [Urine:475]   Intake/Output this shift:  Total I/O In: 518 [P.O.:120; Blood:398] Out: 350 [Urine:350]  Alert nondistressed CVS- RRR no murmurs rubs or gallops RS-poor air entry barrel-shaped chest no wheezes ABD-bowel sounds active no organosplenomegaly EXT- no edema   Basic Metabolic Panel: Recent Labs  Lab 12/09/18 0339 12/09/18 1502 12/10/18 0954  NA 133* 136 139  K 4.1 4.9 5.8*  CL 105 105 109  CO2 14* 12* 14*  GLUCOSE 164* 254* 174*  BUN 83* 89* 110*  CREATININE 8.33* 8.59* 8.27*  CALCIUM 6.4* 6.8* 6.7*  MG 2.0  --   --     Liver Function Tests: No results for input(s): AST, ALT, ALKPHOS, BILITOT, PROT, ALBUMIN in the last 168 hours. No results for input(s): LIPASE, AMYLASE in the last 168 hours. No results for input(s): AMMONIA in the last 168 hours.  CBC: Recent Labs  Lab 12/09/18 0339 12/09/18 1502 12/10/18 0954  WBC 11.6* 8.1 13.3*  NEUTROABS 8.9*  --   --   HGB 7.3* 7.4* 8.5*  HCT 22.9*  23.3* 26.2*  MCV 83.0 83.5 82.6  PLT 193 194 197    Cardiac Enzymes: No results for input(s): CKTOTAL, CKMB, CKMBINDEX, TROPONINI in the last 168 hours.  BNP: Invalid input(s): POCBNP  CBG: No results for input(s): GLUCAP in the last 168 hours.  Microbiology: No results found for this or any previous visit.  Coagulation Studies: No results for input(s): LABPROT, INR in the last 72 hours.  Urinalysis: Recent Labs    12/09/18 1425  COLORURINE YELLOW  LABSPEC 1.010  PHURINE 5.0  GLUCOSEU 150*  HGBUR LARGE*  BILIRUBINUR NEGATIVE  KETONESUR NEGATIVE  PROTEINUR 100*  NITRITE POSITIVE*  LEUKOCYTESUR LARGE*      Imaging: Dg Chest 2 View  Result Date: 12/09/2018 CLINICAL DATA:  Increased cough and shortness of breath. COPD. EXAM: CHEST - 2 VIEW COMPARISON:  01/23/2017. FINDINGS: Normal sized heart. Clear lungs. The lungs remain hyperexpanded with mild peribronchial thickening. Cervical spine fixation hardware. Mild scoliosis. IMPRESSION: Stable changes of COPD and chronic bronchitis. No acute abnormality. Electronically Signed   By: Claudie Revering M.D.   On: 12/09/2018 03:07   US Renal  Result Date: 12/09/2018 CLINICAL DATA:  Acute renal failure EXAM: RENAL / URINARY TRACT ULTRASOUND COMPLETE COMPARISON:  None. FINDINGS: Right Kidney: Renal measurements: 13.2 x 5.6 x 7.9 cm = volume: 307 mL. Parenchyma is echogenic compared to the adjacent liver. No focal lesion or hydronephrosis. Left Kidney: Renal measurements: 13.5 x 6.6  x 6.7 cm = volume: 313 mL. Echogenicity within normal limits. No mass or hydronephrosis visualized. Bladder: Physiologically distended. Focal area of smooth wall thickening posteriorly measuring 2.7 x 1.3 x 4 cm. Incidental note made of splenomegaly measuring at least 14.4 cm length. IMPRESSION: 1. No hydronephrosis. 2. Echogenic renal parenchyma, a nonspecific indicator of medical renal disease 3. Focal wall thickening in the urinary bladder. Consider urologic  evaluation. 4. Splenomegaly Electronically Signed   By: Lucrezia Europe M.D.   On: 12/09/2018 16:19   Dg Humerus Right  Result Date: 12/09/2018 CLINICAL DATA:  47 year old male with reported lump in the right mid arm. EXAM: RIGHT HUMERUS - 2+ VIEW COMPARISON:  Right shoulder radiograph dated 09/22/2018 FINDINGS: There is an area of periosteal thickening in the mid right humeral diaphysis which is not evaluated on this radiograph but may be related to chronic infection or stress. Further evaluation with MRI on a nonemergent basis recommended. There is no acute fracture or dislocation. There is elevated appearance of the humeral head in relation to the glenoid which may be partly positional but is concerning for chronic rotator cuff injury. Evaluation of the shoulder is limited on this radiograph. If there is pain over the shoulder dedicated shoulder radiograph is recommended for better evaluation. The soft tissues are unremarkable. IMPRESSION: 1. No acute fracture or dislocation. 2. Focal area of periosteal thickening in the mid right humeral diaphysis possibly related to chronic infection or stress. Further evaluation with MRI on a nonemergent basis recommended. 3. Elevation of the humeral head in relation to the glenoid, concerning for chronic rotator cuff injury. If there is pain over the right shoulder dedicated radiograph of the shoulder recommended for further evaluation. Electronically Signed   By: Anner Crete M.D.   On: 12/09/2018 03:17     Medications:   . sodium chloride 20 mL/hr at 12/09/18 0346  . sodium chloride 125 mL/hr at 12/09/18 1620  .  sodium bicarbonate (isotonic) infusion in sterile water 125 mL/hr at 12/09/18 1947   . sodium chloride   Intravenous Once  . diphenhydrAMINE  25 mg Oral Once  . famotidine  20 mg Oral QHS  . feeding supplement (ENSURE ENLIVE)  237 mL Oral BID BM  . methocarbamol  500 mg Oral BID  . predniSONE  40 mg Oral Q breakfast   sodium chloride,  acetaminophen **OR** acetaminophen, albuterol, ondansetron **OR** ondansetron (ZOFRAN) IV, polyethylene glycol, traMADol  Assessment/ Plan:   Kidney failure.  Continues with hydration no real return of renal function.  Does have a fairly large kidneys 13.2 cm on right 13.5 cm on left and splenomegaly 14.4 cm no hydronephrosis.  Does have some focal wall thickening in the urinary bladder.  Continue to avoid nephrotoxins ACE inhibitors ARB use IV contrast Cox 2 inhibitors and nonsteroidal anti-inflammatory drugs.  Continues to receive IV fluids as received blood appears to be urinating less short of breath.  SPEP UPEP pending.  Urinalysis shows blood and protein.  Will order serologies for ANCA, hepatitis B, hepatitis C, ANA, anti-GBM, anti-ASO, complements.  May need to consider renal biopsy  Metabolic acidosis continue sodium bicarbonate hydration IV 3 amps in D5W  Anemia received blood recommend GI evaluation  Bones PTH elevated this would suggest chronic process.  Hyperglycemia hemoglobin A1c 6.1  Urine drug screen positive opiates  Hyperkalemia we will start Lokelma 10 g 3 times daily  Cirrhosis with splenomegaly compensated at this time  History of hepatitis C status post treatment  COPD exacerbation  nebs oxygen prednisone antibiotics  Hypertension/volume appears to be fairly stable at this time   LOS: [place] @TODAY @12 :00 PM

## 2018-12-11 ENCOUNTER — Other Ambulatory Visit: Payer: Self-pay

## 2018-12-11 DIAGNOSIS — Z8619 Personal history of other infectious and parasitic diseases: Secondary | ICD-10-CM | POA: Insufficient documentation

## 2018-12-11 DIAGNOSIS — M75100 Unspecified rotator cuff tear or rupture of unspecified shoulder, not specified as traumatic: Secondary | ICD-10-CM | POA: Diagnosis present

## 2018-12-11 DIAGNOSIS — E440 Moderate protein-calorie malnutrition: Secondary | ICD-10-CM | POA: Diagnosis present

## 2018-12-11 DIAGNOSIS — D649 Anemia, unspecified: Secondary | ICD-10-CM | POA: Diagnosis present

## 2018-12-11 DIAGNOSIS — Z8547 Personal history of malignant neoplasm of testis: Secondary | ICD-10-CM | POA: Diagnosis not present

## 2018-12-11 DIAGNOSIS — R0600 Dyspnea, unspecified: Secondary | ICD-10-CM | POA: Diagnosis present

## 2018-12-11 DIAGNOSIS — J439 Emphysema, unspecified: Secondary | ICD-10-CM | POA: Diagnosis present

## 2018-12-11 DIAGNOSIS — Z6821 Body mass index (BMI) 21.0-21.9, adult: Secondary | ICD-10-CM | POA: Diagnosis not present

## 2018-12-11 DIAGNOSIS — E872 Acidosis: Secondary | ICD-10-CM | POA: Diagnosis present

## 2018-12-11 DIAGNOSIS — E875 Hyperkalemia: Secondary | ICD-10-CM | POA: Diagnosis present

## 2018-12-11 DIAGNOSIS — B1920 Unspecified viral hepatitis C without hepatic coma: Secondary | ICD-10-CM | POA: Diagnosis not present

## 2018-12-11 DIAGNOSIS — S46001A Unspecified injury of muscle(s) and tendon(s) of the rotator cuff of right shoulder, initial encounter: Secondary | ICD-10-CM | POA: Diagnosis present

## 2018-12-11 DIAGNOSIS — N179 Acute kidney failure, unspecified: Secondary | ICD-10-CM | POA: Diagnosis not present

## 2018-12-11 DIAGNOSIS — Z96659 Presence of unspecified artificial knee joint: Secondary | ICD-10-CM | POA: Diagnosis present

## 2018-12-11 DIAGNOSIS — K7469 Other cirrhosis of liver: Secondary | ICD-10-CM | POA: Diagnosis not present

## 2018-12-11 DIAGNOSIS — J441 Chronic obstructive pulmonary disease with (acute) exacerbation: Secondary | ICD-10-CM | POA: Diagnosis not present

## 2018-12-11 DIAGNOSIS — K7460 Unspecified cirrhosis of liver: Secondary | ICD-10-CM | POA: Diagnosis present

## 2018-12-11 DIAGNOSIS — F17210 Nicotine dependence, cigarettes, uncomplicated: Secondary | ICD-10-CM | POA: Diagnosis present

## 2018-12-11 DIAGNOSIS — N189 Chronic kidney disease, unspecified: Secondary | ICD-10-CM

## 2018-12-11 DIAGNOSIS — I10 Essential (primary) hypertension: Secondary | ICD-10-CM | POA: Diagnosis present

## 2018-12-11 DIAGNOSIS — Z809 Family history of malignant neoplasm, unspecified: Secondary | ICD-10-CM | POA: Diagnosis not present

## 2018-12-11 LAB — BASIC METABOLIC PANEL
ANION GAP: 18 — AB (ref 5–15)
BUN: 127 mg/dL — ABNORMAL HIGH (ref 6–20)
CO2: 18 mmol/L — ABNORMAL LOW (ref 22–32)
Calcium: 7.3 mg/dL — ABNORMAL LOW (ref 8.9–10.3)
Chloride: 102 mmol/L (ref 98–111)
Creatinine, Ser: 7.74 mg/dL — ABNORMAL HIGH (ref 0.61–1.24)
GFR calc Af Amer: 9 mL/min — ABNORMAL LOW (ref >60)
GFR calc non Af Amer: 8 mL/min — ABNORMAL LOW (ref >60)
Glucose, Bld: 135 mg/dL — ABNORMAL HIGH (ref 70–99)
Potassium: 5.3 mmol/L — ABNORMAL HIGH (ref 3.5–5.1)
Sodium: 138 mmol/L (ref 135–145)

## 2018-12-11 LAB — BPAM RBC
Blood Product Expiration Date: 202001022359
Blood Product Expiration Date: 202001022359
ISSUE DATE / TIME: 201912132318
ISSUE DATE / TIME: 201912140344
Unit Type and Rh: 7300
Unit Type and Rh: 7300

## 2018-12-11 LAB — CBC
HCT: 23.3 % — ABNORMAL LOW (ref 39.0–52.0)
Hemoglobin: 7.7 g/dL — ABNORMAL LOW (ref 13.0–17.0)
MCH: 27 pg (ref 26.0–34.0)
MCHC: 33 g/dL (ref 30.0–36.0)
MCV: 81.8 fL (ref 80.0–100.0)
Platelets: 264 10*3/uL (ref 150–400)
RBC: 2.85 MIL/uL — ABNORMAL LOW (ref 4.22–5.81)
RDW: 15.9 % — ABNORMAL HIGH (ref 11.5–15.5)
WBC: 14.2 10*3/uL — ABNORMAL HIGH (ref 4.0–10.5)
nRBC: 0 % (ref 0.0–0.2)

## 2018-12-11 LAB — RAPID URINE DRUG SCREEN, HOSP PERFORMED
Amphetamines: NOT DETECTED
Barbiturates: NOT DETECTED
Benzodiazepines: NOT DETECTED
Cocaine: NOT DETECTED
Opiates: POSITIVE — AB
Tetrahydrocannabinol: NOT DETECTED

## 2018-12-11 LAB — COMMENT2 - HEP PANEL

## 2018-12-11 LAB — C3 COMPLEMENT: C3 Complement: 125 mg/dL (ref 82–167)

## 2018-12-11 LAB — TYPE AND SCREEN
ABO/RH(D): B POS
Antibody Screen: NEGATIVE
Unit division: 0
Unit division: 0

## 2018-12-11 LAB — HEPATITIS C ANTIBODY (REFLEX): HCV Ab: >11 s/co ratio — ABNORMAL HIGH (ref 0.0–0.9)

## 2018-12-11 LAB — HIV ANTIBODY (ROUTINE TESTING W REFLEX): HIV Screen 4th Generation wRfx: NONREACTIVE

## 2018-12-11 LAB — C4 COMPLEMENT: Complement C4, Body Fluid: 37 mg/dL (ref 14–44)

## 2018-12-11 LAB — HEPATITIS B SURFACE ANTIGEN: Hepatitis B Surface Ag: NEGATIVE

## 2018-12-11 LAB — ANTISTREPTOLYSIN O TITER: ASO: 87 IU/mL (ref 0.0–200.0)

## 2018-12-11 MED ORDER — ACETAMINOPHEN 650 MG RE SUPP: 650.0000 mg | Freq: Four times a day (QID) | RECTAL | Status: DC | PRN

## 2018-12-11 MED ORDER — FAMOTIDINE IN NACL 20-0.9 MG/50ML-% IV SOLN: 20.0000 mg | INTRAVENOUS | Status: DC

## 2018-12-11 MED ORDER — PREDNISONE 20 MG PO TABS
40.0000 mg | ORAL_TABLET | Freq: Every day | ORAL | Status: DC
  Administered 2018-12-11: 40 mg via ORAL
  Filled 2018-12-11: qty 2

## 2018-12-11 MED ORDER — SODIUM BICARBONATE 650 MG PO TABS
1300.0000 mg | ORAL_TABLET | Freq: Two times a day (BID) | ORAL | Status: DC
  Administered 2018-12-11 – 2018-12-12 (×3): 1300 mg via ORAL
  Filled 2018-12-11 (×4): qty 2

## 2018-12-11 MED ORDER — ACETAMINOPHEN 325 MG PO TABS: 650.0000 mg | ORAL_TABLET | Freq: Four times a day (QID) | ORAL | Status: DC | PRN

## 2018-12-11 MED ORDER — AZITHROMYCIN 500 MG PO TABS
250.0000 mg | ORAL_TABLET | Freq: Every day | ORAL | Status: DC
  Administered 2018-12-12: 250 mg via ORAL
  Filled 2018-12-11: qty 1

## 2018-12-11 MED ORDER — PREDNISONE 20 MG PO TABS
40.0000 mg | ORAL_TABLET | Freq: Every day | ORAL | Status: DC
  Administered 2018-12-12: 40 mg via ORAL
  Filled 2018-12-11: qty 2

## 2018-12-11 MED ORDER — FAMOTIDINE 20 MG PO TABS: 20.0000 mg | ORAL_TABLET | Freq: Every day | ORAL | Status: DC

## 2018-12-11 MED ORDER — SODIUM ZIRCONIUM CYCLOSILICATE 10 G PO PACK
10.0000 g | PACK | Freq: Three times a day (TID) | ORAL | Status: DC
  Administered 2018-12-11 (×2): 10 g via ORAL
  Filled 2018-12-11 (×4): qty 1

## 2018-12-11 MED ORDER — HEPARIN SODIUM (PORCINE) 5000 UNIT/ML IJ SOLN
5000.0000 [IU] | Freq: Three times a day (TID) | INTRAMUSCULAR | Status: DC
  Administered 2018-12-11 – 2018-12-12 (×4): 5000 [IU] via SUBCUTANEOUS
  Filled 2018-12-11 (×4): qty 1

## 2018-12-11 MED ORDER — STERILE WATER FOR INJECTION IV SOLN
INTRAVENOUS | Status: DC
  Administered 2018-12-11: 06:00:00 via INTRAVENOUS
  Filled 2018-12-11 (×3): qty 850

## 2018-12-11 MED ORDER — AZITHROMYCIN 500 MG PO TABS
500.0000 mg | ORAL_TABLET | Freq: Every day | ORAL | Status: AC
  Administered 2018-12-11: 500 mg via ORAL
  Filled 2018-12-11: qty 1

## 2018-12-11 MED ORDER — ALBUTEROL SULFATE (2.5 MG/3ML) 0.083% IN NEBU: 2.5000 mg | INHALATION_SOLUTION | Freq: Four times a day (QID) | RESPIRATORY_TRACT | Status: DC | PRN

## 2018-12-11 MED ORDER — ENSURE ENLIVE PO LIQD
237.0000 mL | Freq: Two times a day (BID) | ORAL | Status: DC
  Administered 2018-12-11 – 2018-12-12 (×2): 237 mL via ORAL

## 2018-12-11 MED ORDER — ONDANSETRON HCL 4 MG PO TABS: 4.0000 mg | ORAL_TABLET | Freq: Four times a day (QID) | ORAL | Status: DC | PRN

## 2018-12-11 MED ORDER — NICOTINE 21 MG/24HR TD PT24
21.0000 mg | MEDICATED_PATCH | Freq: Every day | TRANSDERMAL | Status: DC
  Administered 2018-12-11 – 2018-12-12 (×2): 21 mg via TRANSDERMAL
  Filled 2018-12-11 (×2): qty 1

## 2018-12-11 MED ORDER — TRAMADOL HCL 50 MG PO TABS
50.0000 mg | ORAL_TABLET | Freq: Four times a day (QID) | ORAL | Status: DC | PRN
  Administered 2018-12-11 – 2018-12-12 (×4): 50 mg via ORAL
  Filled 2018-12-11 (×4): qty 1

## 2018-12-11 MED ORDER — IPRATROPIUM-ALBUTEROL 0.5-2.5 (3) MG/3ML IN SOLN
3.0000 mL | Freq: Four times a day (QID) | RESPIRATORY_TRACT | Status: DC
  Administered 2018-12-11 – 2018-12-12 (×3): 3 mL via RESPIRATORY_TRACT
  Filled 2018-12-11 (×5): qty 3

## 2018-12-11 MED ORDER — ONDANSETRON HCL 4 MG/2ML IJ SOLN: 4.0000 mg | Freq: Four times a day (QID) | INTRAMUSCULAR | Status: DC | PRN

## 2018-12-11 MED ORDER — METHOCARBAMOL 500 MG PO TABS
500.0000 mg | ORAL_TABLET | Freq: Two times a day (BID) | ORAL | Status: DC
  Administered 2018-12-11 – 2018-12-12 (×3): 500 mg via ORAL
  Filled 2018-12-11 (×3): qty 1

## 2018-12-11 NOTE — H&P (Signed)
History and Physical    Ernest Reese DJS:[id number] DOB: 1971-05-24 DOA: 12/10/2018  PCP: [facility] [place]  Patient coming from: Home  I have personally briefly reviewed patient's old medical records in [place]  Chief Complaint: Daughter made him come back to hospital  HPI: Ernest Reese is a 47 y.o. male with medical history significant of COPD, testicular cancer.  Patient was admitted to our service on 12/13 for kidney failure with creat of 8.  Unclear wether acute or chronic though he also had low bicarb and anemia with HGB 7.  He was transfused blood, put on bicarb gtt, nephrotoxic meds especially NSAIDS were held (he was taking 1600mg  Ibuprofen every couple of hours for past couple of weeks due to rotator cuff tear).  And nephrology was consulted.  Unfortunately before he completed treatment, he left AMA last evening around 9pm.  His daughter wasn't very happy with the patients decision and so he came back to ED a short while later at 11:50pm for readmission.   ED Course: Patient states his urine is now clear and he is producing quite a bit of it which he attributes to the IVF we have been giving him.  He adamantly denies heroin abuse.  Says he takes percocet 5, albiet illicitly obtained from a friend, for his shoulder pain and that's it.  Does want nicotine patch (admits he smokes "more than" a pack per day).  Declines any opiate replacement therapy and denies that he is at risk for withdrawal.   Review of Systems: As per HPI otherwise 10 point review of systems negative.   Past Medical History:  Diagnosis Date  . Emphysema lung ([place])   . Testicular cancer [facility])     Past Surgical History:  Procedure Laterality Date  . HAND SURGERY    . NECK SURGERY    . REPLACEMENT TOTAL KNEE    . testicle removed       reports that he has been smoking. He has never used smokeless tobacco. He reports previous alcohol use. He reports that he does not use drugs.  No  Known Allergies  Family History  Problem Relation Age of Onset  . Cancer Father      Prior to Admission medications   Medication Sig Start Date End Date Taking? Authorizing Provider  albuterol (PROVENTIL HFA;VENTOLIN HFA) 108 (90 Base) MCG/ACT inhaler Inhale into the lungs every 6 (six) hours as needed for wheezing or shortness of breath.   Yes [provider]  albuterol (PROVENTIL) (2.5 MG/3ML) 0.083% nebulizer solution Take 2.5 mg by nebulization every 6 (six) hours as needed for wheezing or shortness of breath.   Yes [provider]  diclofenac (VOLTAREN) 75 MG EC tablet Take 1 tablet (75 mg total) by mouth 2 (two) times daily. Patient not taking: Reported on 12/10/2018 09/22/18   Fransico Meadow, PA-C  methocarbamol (ROBAXIN) 500 MG tablet Take 1 tablet (500 mg total) by mouth 2 (two) times daily. Patient not taking: Reported on 12/10/2018 09/22/18   Sidney Ace    Physical Exam: Vitals:   12/10/18 2352  BP: 126/72  Pulse: 96  Resp: 20  Temp: 98.2 F (36.8 C)  TempSrc: Oral  SpO2: 97%    Constitutional: NAD, calm, comfortable Eyes: PERRL, lids and conjunctivae normal ENMT: Mucous membranes are moist. Posterior pharynx clear of any exudate or lesions.Normal dentition.  Neck: normal, supple, no masses, no thyromegaly Respiratory: B wheezes, rhonchi and rales.  Cardiovascular: Regular rate  and rhythm, no murmurs / rubs / gallops. No extremity edema. 2+ pedal pulses. No carotid bruits.  Abdomen: no tenderness, no masses palpated. No hepatosplenomegaly. Bowel sounds positive.  Musculoskeletal: no clubbing / cyanosis. No joint deformity upper and lower extremities. Good ROM, no contractures. Normal muscle tone.  Skin: no rashes, lesions, ulcers. No induration Neurologic: CN 2-12 grossly intact. Sensation intact, DTR normal. Strength 5/5 in all 4.  Psychiatric: Normal judgment and insight. Alert and oriented x 3. Normal mood.    Labs on Admission: I  have personally reviewed following labs and imaging studies  CBC: Recent Labs  Lab 12/09/18 0339 12/09/18 1502 12/10/18 0954 12/11/18 0430  WBC 11.6* 8.1 13.3* 14.2*  NEUTROABS 8.9*  --   --   --   HGB 7.3* 7.4* 8.5* 7.7*  HCT 22.9* 23.3* 26.2* 23.3*  MCV 83.0 83.5 82.6 81.8  PLT 193 194 197 009   Basic Metabolic Panel: Recent Labs  Lab 12/09/18 0339 12/09/18 1502 12/10/18 0954 12/10/18 1411 12/11/18 0430  NA 133* 136 139 138 138  K 4.1 4.9 5.8* 5.5* 5.3*  CL 105 105 109 105 102  CO2 14* 12* 14* 16* 18*  GLUCOSE 164* 254* 174* 180* 135*  BUN 83* 89* 110* 116* 127*  CREATININE 8.33* 8.59* 8.27* 7.97* 7.74*  CALCIUM 6.4* 6.8* 6.7* 6.9* 7.3*  MG 2.0  --   --   --   --    GFR: Estimated Creatinine Clearance: 11.9 mL/min (A) (by C-G formula based on SCr of 7.74 mg/dL (H)). Liver Function Tests: Recent Labs  Lab 12/10/18 1411  AST 15  ALT 12  ALKPHOS 98  BILITOT 0.5  PROT 7.1  ALBUMIN 2.2*   No results for input(s): LIPASE, AMYLASE in the last 168 hours. No results for input(s): AMMONIA in the last 168 hours. Coagulation Profile: No results for input(s): INR, PROTIME in the last 168 hours. Cardiac Enzymes: No results for input(s): CKTOTAL, CKMB, CKMBINDEX, TROPONINI in the last 168 hours. BNP (last 3 results) No results for input(s): PROBNP in the last 8760 hours. HbA1C: Recent Labs    12/09/18 1825  HGBA1C 6.1*   CBG: No results for input(s): GLUCAP in the last 168 hours. Lipid Profile: No results for input(s): CHOL, HDL, LDLCALC, TRIG, CHOLHDL, LDLDIRECT in the last 72 hours. Thyroid Function Tests: No results for input(s): TSH, T4TOTAL, FREET4, T3FREE, THYROIDAB in the last 72 hours. Anemia Panel: Recent Labs    12/09/18 1502  VITAMINB12 701  FOLATE 15.3  FERRITIN 211  TIBC 217*  IRON 19*  RETICCTPCT 0.5   Urine analysis:    Component Value Date/Time   COLORURINE YELLOW 12/09/2018 1425   APPEARANCEUR TURBID (A) 12/09/2018 1425   LABSPEC  1.010 12/09/2018 1425   PHURINE 5.0 12/09/2018 1425   GLUCOSEU 150 (A) 12/09/2018 1425   HGBUR LARGE (A) 12/09/2018 1425   BILIRUBINUR NEGATIVE 12/09/2018 1425   KETONESUR NEGATIVE 12/09/2018 1425   PROTEINUR 100 (A) 12/09/2018 1425   NITRITE POSITIVE (A) 12/09/2018 1425   LEUKOCYTESUR LARGE (A) 12/09/2018 1425    Radiological Exams on Admission: US Renal  Result Date: 12/09/2018 CLINICAL DATA:  Acute renal failure EXAM: RENAL / URINARY TRACT ULTRASOUND COMPLETE COMPARISON:  None. FINDINGS: Right Kidney: Renal measurements: 13.2 x 5.6 x 7.9 cm = volume: 307 mL. Parenchyma is echogenic compared to the adjacent liver. No focal lesion or hydronephrosis. Left Kidney: Renal measurements: 13.5 x 6.6 x 6.7 cm = volume: 313 mL. Echogenicity within normal limits.  No mass or hydronephrosis visualized. Bladder: Physiologically distended. Focal area of smooth wall thickening posteriorly measuring 2.7 x 1.3 x 4 cm. Incidental note made of splenomegaly measuring at least 14.4 cm length. IMPRESSION: 1. No hydronephrosis. 2. Echogenic renal parenchyma, a nonspecific indicator of medical renal disease 3. Focal wall thickening in the urinary bladder. Consider urologic evaluation. 4. Splenomegaly Electronically Signed   By: Lucrezia Europe M.D.   On: 12/09/2018 16:19    EKG: Independently reviewed.  Assessment/Plan Principal Problem:   Acute kidney failure (HCC) Active Problems:   COPD with acute exacerbation (HCC)   Anemia   Hx of hepatitis C   Injury of right rotator cuff    1. Kidney failure - unclear if acute or chronic 1. Re-consult nephro in AM 2. Resume isotonic bicarb at 125 cc/hr 3. Avoid nephrotoxic meds 4. Ordered all of the lab work that Dr. Justin Mend had requested in his consult note yesterday (since it isn't clear if it was drawn yesterday or not). 2. COPD exacerbation - 1. Prednisone 2. PRN nebs 3. Anemia - 1. S/p transfusion 2. Dr. Justin Mend recs GI eval in his note 4. H/o HCV - s/p  treatment 1. HCV viral load pending 5. Injury of R rotator cuff - 1. Resume robaxin and ultram  DVT prophylaxis: Heparin [place] Code Status: Full Family Communication: No family in room Disposition Plan: Home after admit Consults called: None, call nephro in AM Admission status: Admit to inpatient  Severity of Illness: The appropriate patient status for this patient is INPATIENT. Inpatient status is judged to be reasonable and necessary in order to provide the required intensity of service to ensure the patient's safety. The patient's presenting symptoms, physical exam findings, and initial radiographic and laboratory data in the context of their chronic comorbidities is felt to place them at high risk for further clinical deterioration. Furthermore, it is not anticipated that the patient will be medically stable for discharge from the hospital within 2 midnights of admission. The following factors support the patient status of inpatient.   " The patient's presenting symptoms include SOB. " The worrisome physical exam findings include SOB wheezing. " The initial radiographic and laboratory data are worrisome because of AKF with creat 7.x, BUN 120. " The chronic co-morbidities include COPD.   * I certify that at the point of admission it is my clinical judgment that the patient will require inpatient hospital care spanning beyond 2 midnights from the point of admission due to high intensity of service, high risk for further deterioration and high frequency of surveillance required.Etta Quill DO [facility] Pager [phone] Only works nights!  If 7AM-7PM, please contact the primary day team physician taking care of patient  [email or web address] Password [facility]  12/11/2018, 5:31 AM

## 2018-12-11 NOTE — Progress Notes (Signed)
Patient received from ED via bed. Patient is alert and oriented x4. Vital signs are stable. Skin assessment done with another nurse. Iv in place and running fluid. Patient given instructions about call bell and phone. Bed in low position and call bell in reach.

## 2018-12-11 NOTE — ED Notes (Signed)
Pt stepped outside to let his ride know to leave.

## 2018-12-11 NOTE — ED Provider Notes (Signed)
[facility] EMERGENCY DEPARTMENT Provider Note   CSN: [id number] Arrival date & time: 12/10/18  2306     History   Chief Complaint Chief Complaint  Patient presents with  . Wants note    HPI Ernest Reese is a 47 y.o. male.  HPI  47 year old male with history of COPD, testicular cancer comes into the ER for reevaluation.  Patient was admitted to the hospital 2 nights ago because of COPD exacerbation.  He was noted to be significantly anemic and in acute renal failure.  It appears that last night patient left AMA.  He reports that once he got home his family got on his "tail' and wanted him to come back to the hospital and get admitted. Patient denies any new chest pain, shortness of breath.  He denies any substance abuse.  Past Medical History:  Diagnosis Date  . Emphysema lung ([place])   . Testicular cancer [facility])     Patient Active Problem List   Diagnosis Date Noted  . Renal insufficiency 12/09/2018  . COPD with acute exacerbation ([place]) 12/09/2018  . Acute kidney failure ([place]) 12/09/2018  . Anemia 12/09/2018  . ARF (acute renal failure) ([place]) 12/09/2018  . Malnutrition of moderate degree 12/09/2018  . Emphysema lung ([place])     Past Surgical History:  Procedure Laterality Date  . HAND SURGERY    . NECK SURGERY    . REPLACEMENT TOTAL KNEE    . testicle removed          Home Medications    Prior to Admission medications   Medication Sig Start Date End Date Taking? Authorizing Provider  albuterol (PROVENTIL HFA;VENTOLIN HFA) 108 (90 Base) MCG/ACT inhaler Inhale into the lungs every 6 (six) hours as needed for wheezing or shortness of breath.    [provider]  albuterol (PROVENTIL) (2.5 MG/3ML) 0.083% nebulizer solution Take 2.5 mg by nebulization every 6 (six) hours as needed for wheezing or shortness of breath.    [provider]  diclofenac (VOLTAREN) 75 MG EC tablet Take 1 tablet (75 mg total) by mouth 2 (two) times  daily. Patient not taking: Reported on 12/10/2018 09/22/18   Fransico Meadow, PA-C  methocarbamol (ROBAXIN) 500 MG tablet Take 1 tablet (500 mg total) by mouth 2 (two) times daily. Patient not taking: Reported on 12/10/2018 09/22/18   Sidney Ace    Family History Family History  Problem Relation Age of Onset  . Cancer Father     Social History Social History   Tobacco Use  . Smoking status: Current Every Day Smoker  . Smokeless tobacco: Never Used  Substance Use Topics  . Alcohol use: Not Currently    Frequency: Never  . Drug use: Never     Allergies   Patient has no known allergies.   Review of Systems Review of Systems  Constitutional: Positive for activity change.  Respiratory: Negative for shortness of breath.   Cardiovascular: Negative for chest pain.  Neurological: Negative for dizziness.     Physical Exam Updated Vital Signs BP 126/72 (BP Location: Right Arm)   Pulse 96   Temp 98.2 F (36.8 C) (Oral)   Resp 20   SpO2 97%   Physical Exam Vitals signs and nursing note reviewed.  Constitutional:      Appearance: He is well-developed.  HENT:     Head: Atraumatic.  Neck:     Musculoskeletal: Neck supple.  Cardiovascular:     Rate and Rhythm: Normal  rate.  Pulmonary:     Effort: Pulmonary effort is normal. No respiratory distress.  Skin:    General: Skin is warm.  Neurological:     Mental Status: He is alert and oriented to person, place, and time.      ED Treatments / Results  Labs (all labs ordered are listed, but only abnormal results are displayed) Labs Reviewed  BASIC METABOLIC PANEL  CBC  RAPID URINE DRUG SCREEN, HOSP PERFORMED    EKG None  Radiology US Renal  Result Date: 12/09/2018 CLINICAL DATA:  Acute renal failure EXAM: RENAL / URINARY TRACT ULTRASOUND COMPLETE COMPARISON:  None. FINDINGS: Right Kidney: Renal measurements: 13.2 x 5.6 x 7.9 cm = volume: 307 mL. Parenchyma is echogenic compared to the adjacent  liver. No focal lesion or hydronephrosis. Left Kidney: Renal measurements: 13.5 x 6.6 x 6.7 cm = volume: 313 mL. Echogenicity within normal limits. No mass or hydronephrosis visualized. Bladder: Physiologically distended. Focal area of smooth wall thickening posteriorly measuring 2.7 x 1.3 x 4 cm. Incidental note made of splenomegaly measuring at least 14.4 cm length. IMPRESSION: 1. No hydronephrosis. 2. Echogenic renal parenchyma, a nonspecific indicator of medical renal disease 3. Focal wall thickening in the urinary bladder. Consider urologic evaluation. 4. Splenomegaly Electronically Signed   By: Lucrezia Europe M.D.   On: 12/09/2018 16:19    Procedures Procedures (including critical care time)  Medications Ordered in ED Medications - No data to display   Initial Impression / Assessment and Plan / ED Course  I have reviewed the triage vital signs and the nursing notes.  Pertinent labs & imaging results that were available during my care of the patient were reviewed by me and considered in my medical decision making (see chart for details).     47 year old male with history of COPD, acute renal failure, severe anemia likely due to his kidney failure comes into the ER for reevaluation.  Patient left AMA while his work-up was ongoing to discover the reason for his renal failure.  It appears that he went home and the family forced him to come back to the hospital for reassessment and admission.  He has no complaints at this time.  Unfortunately he does not have any follow-up information provided since he left AMA.  We will consult medicine to see if patient needs admission to the hospital.  If he does not need admission to the hospital then we would request them to give him appropriate follow-up instructions.  Final Clinical Impressions(s) / ED Diagnoses   Final diagnoses:  None    ED Discharge Orders    None       Varney Biles, MD 12/11/18 [phone]

## 2018-12-11 NOTE — Progress Notes (Signed)
[facility] ROUNDING NOTE   Subjective:   Signed out AMA last night  Returned today on urging from family  BP 148/75 P 92  T 98.2  sats 96% room air  2 units packed red blood cells transfused 12/09/2018.  Sodium 138 K 5.3 CL 102 CO2 18 Glc 135 BUN 127 Cr 7.74 alb 2.2 AST 15 ALT 12  WBC 14.2 Hb 7.7 Plt 264  Urinalysis turbid appearing urine large blood 100 mg/dL protein.  HIV screen nonreactive PTH 140     Objective:  Vital signs in last 24 hours:  Temp:  [97.4 F (36.3 C)-98.2 F (36.8 C)] 98.2 F (36.8 C) (12/15 0621) Pulse Rate:  [92-96] 92 (12/15 0621) Resp:  [16-20] 16 (12/15 0540) BP: (107-148)/(51-77) 148/75 (12/15 0621) SpO2:  [95 %-98 %] 98 % (12/15 0621)  Weight change:  There were no vitals filed for this visit.  Intake/Output: No intake/output data recorded.   Intake/Output this shift:  Total I/O In: 360 [P.O.:360] Out: 750 [Urine:750]  Alert nondistressed CVS- RRR no murmurs rubs or gallops RS-poor air entry barrel-shaped chest no wheezes ABD-bowel sounds active no organosplenomegaly EXT- no edema   Basic Metabolic Panel: Recent Labs  Lab 12/09/18 0339 12/09/18 1502 12/10/18 0954 12/10/18 1411 12/11/18 0430  NA 133* 136 139 138 138  K 4.1 4.9 5.8* 5.5* 5.3*  CL 105 105 109 105 102  CO2 14* 12* 14* 16* 18*  GLUCOSE 164* 254* 174* 180* 135*  BUN 83* 89* 110* 116* 127*  CREATININE 8.33* 8.59* 8.27* 7.97* 7.74*  CALCIUM 6.4* 6.8* 6.7* 6.9* 7.3*  MG 2.0  --   --   --   --     Liver Function Tests: Recent Labs  Lab 12/10/18 1411  AST 15  ALT 12  ALKPHOS 98  BILITOT 0.5  PROT 7.1  ALBUMIN 2.2*   No results for input(s): LIPASE, AMYLASE in the last 168 hours. No results for input(s): AMMONIA in the last 168 hours.  CBC: Recent Labs  Lab 12/09/18 0339 12/09/18 1502 12/10/18 0954 12/11/18 0430  WBC 11.6* 8.1 13.3* 14.2*  NEUTROABS 8.9*  --   --   --   HGB 7.3* 7.4* 8.5* 7.7*  HCT 22.9* 23.3* 26.2* 23.3*  MCV  83.0 83.5 82.6 81.8  PLT 193 194 197 264    Cardiac Enzymes: No results for input(s): CKTOTAL, CKMB, CKMBINDEX, TROPONINI in the last 168 hours.  BNP: Invalid input(s): POCBNP  CBG: No results for input(s): GLUCAP in the last 168 hours.  Microbiology: No results found for this or any previous visit.  Coagulation Studies: No results for input(s): LABPROT, INR in the last 72 hours.  Urinalysis: Recent Labs    12/09/18 1425  COLORURINE YELLOW  LABSPEC 1.010  PHURINE 5.0  GLUCOSEU 150*  HGBUR LARGE*  BILIRUBINUR NEGATIVE  KETONESUR NEGATIVE  PROTEINUR 100*  NITRITE POSITIVE*  LEUKOCYTESUR LARGE*      Imaging: US Renal  Result Date: 12/09/2018 CLINICAL DATA:  Acute renal failure EXAM: RENAL / URINARY TRACT ULTRASOUND COMPLETE COMPARISON:  None. FINDINGS: Right Kidney: Renal measurements: 13.2 x 5.6 x 7.9 cm = volume: 307 mL. Parenchyma is echogenic compared to the adjacent liver. No focal lesion or hydronephrosis. Left Kidney: Renal measurements: 13.5 x 6.6 x 6.7 cm = volume: 313 mL. Echogenicity within normal limits. No mass or hydronephrosis visualized. Bladder: Physiologically distended. Focal area of smooth wall thickening posteriorly measuring 2.7 x 1.3 x 4 cm. Incidental note made of splenomegaly  measuring at least 14.4 cm length. IMPRESSION: 1. No hydronephrosis. 2. Echogenic renal parenchyma, a nonspecific indicator of medical renal disease 3. Focal wall thickening in the urinary bladder. Consider urologic evaluation. 4. Splenomegaly Electronically Signed   By: Lucrezia Europe M.D.   On: 12/09/2018 16:19     Medications:   .  sodium bicarbonate (isotonic) infusion in sterile water 125 mL/hr at 12/11/18 0541   . [START ON 12/12/2018] azithromycin  250 mg Oral Daily  . feeding supplement (ENSURE ENLIVE)  237 mL Oral BID BM  . heparin  5,000 Units Subcutaneous Q8H  . ipratropium-albuterol  3 mL Nebulization Q6H  . methocarbamol  500 mg Oral BID  . nicotine  21 mg  Transdermal Daily  . [START ON 12/12/2018] predniSONE  40 mg Oral Q breakfast   acetaminophen **OR** acetaminophen, albuterol, ondansetron **OR** ondansetron (ZOFRAN) IV, traMADol  Assessment/ Plan:   Kidney failure.  Continues with hydration no real return of renal function.  Does have a fairly large kidneys 13.2 cm on right 13.5 cm on left and splenomegaly 14.4 cm no hydronephrosis.  Does have some focal wall thickening in the urinary bladder.  Continue to avoid nephrotoxins ACE inhibitors ARB use IV contrast Cox 2 inhibitors and nonsteroidal anti-inflammatory drugs.  Continues to receive IV fluids as received blood appears to be urinating less short of breath.  SPEP UPEP pending.  Urinalysis shows blood and protein.   Serologies for ANCA, hepatitis B, hepatitis C, ANA, anti-GBM, anti-ASO, complements pending.  May need to consider renal biopsy  Metabolic acidosis will change to oral bicarbonate  Anemia received blood recommend GI evaluation  Bones PTH elevated this would suggest chronic process.  Hyperglycemia hemoglobin A1c 6.1  Urine drug screen positive opiates. Family insist that IV Drug use is occurring will see if we can screen for heroine  Hyperkalemia continue Lokelma 10 g 3 times daily  Cirrhosis with splenomegaly compensated at this time  History of hepatitis C status post treatment  COPD exacerbation nebs oxygen prednisone antibiotics  Hypertension/volume appears to be fairly stable at this time   LOS: [place] @TODAY @2 :24 PM

## 2018-12-11 NOTE — Progress Notes (Signed)
Patient was ambulating with P.T, when he approached me by the nursing station.He said " I am going out from the building by myself for fresh air'.Patient was catching his breathing as result of ambulating with the P.T.I told him that I was not going to allowed it ,becouse he was very short of breathing as we speak and it would be at risk for his safety.Patient got mad and shouted at me and he said'' this is normal for me,i am going to to put my shoes on and get out from here if you don't allow me".I requested the patient to go back in his room while waiting for the order.Went into patient's room and explained to him that it would be his best interest to stay within the unit's hallway ,and he needs to comply for his safety.Patient become real agitated.Nurse explained to him another option ,if he would like to go out by himself by signing AMA form.Patient refused to sign and went into his bed.

## 2018-12-11 NOTE — Progress Notes (Signed)
24-hour urine collection started at approximately 1700 on 12/11/18.  Roselyn Reef Tkeyah Burkman,RN

## 2018-12-11 NOTE — Progress Notes (Signed)
PROGRESS NOTE    Ernest Reese   PZW:[id number]  DOB: 1971/04/18  DOA: 12/10/2018 PCP: [name]   Brief Narrative:  Ernest Reese is a 47 y.o. male with a history of emphysema, ongoing smoking, history of testicular cancer who does not follow with a PCP. Patient's presents with shortness of breath & cough that is nonproductive.  He was seen at [facility] ED given steroids and nebulizer treatments with improvement.  Blood work was done which shows acute kidney injury.  Patient has had shoulder injury approximately 3 months ago and has been taking about 6400 mg of ibuprofen daily for the past 3 months. In ED > BUN/ cr 83, 8.33, noted to be having a COPD exacerbation  Subjective: He left AMA yesterday and then returned because his daughter sent him back per triage notes. He tells me he does not want to talk about why he left. He has no complaints.  Assessment & Plan:   Principal Problem:   Acute kidney failure , metabolic acidosis - possibly due to NSAID use - appreciate nephro eval and work up - cont bicarb infusion - renal ultrasound unrevealing - See labs below  Active Problems: Hyperkalemia -likely related to AKI- on lokelma    COPD with acute exacerbation - smoked 1 ppd - still wheezing, congested -cont nebs, Prednisone x 5 days - add Zpak - advised to stop smoking  Cirrhosis - Splenomegaly noted on renal ultrasound    Hep C  - treated    Anemia - anemia panel suggestive of AOCD  - received 2 U PRBC on admission - hemoccult ordered  Drug abuse??? - Family thinks he is abusing heroin again but he adamantly denies this - UDS + for opiates- he states he took an oxycodone from his buddy for his rotator cuff issues   Right shoulder pain/ rotator cuff injury - robaxin & Ultram ordered on admission continue- he has outpt f/u with an orthopedist that he has been seening  Glucose intolerance - A1c 6.1      Moderate Malnutrition -  Interventions: Ensure Enlive      DVT prophylaxis: SCDs, ambulating Code Status: Full code Family Communication:  Disposition Plan: follow renal function and resp status Consultants:   nephro Procedures:   none Antimicrobials:  Anti-infectives (From admission, onward)   None       Objective: Vitals:   12/10/18 2352 12/11/18 0540 12/11/18 0621  BP: 126/72 (!) 107/51 (!) 148/75  Pulse: 96 94 92  Resp: 20 16   Temp: 98.2 F (36.8 C)  98.2 F (36.8 C)  TempSrc: Oral    SpO2: 97% 95% 98%    Intake/Output Summary (Last 24 hours) at 12/11/2018 1244 Last data filed at 12/11/2018 1112 Gross per 24 hour  Intake -  Output 750 ml  Net -750 ml   There were no vitals filed for this visit.  Examination: General exam: Appears comfortable  HEENT: PERRLA, oral mucosa moist, no sclera icterus or thrush Respiratory system: rhonchi and cough Cardiovascular system: S1 & S2 heard, RRR.   Gastrointestinal system: Abdomen soft, non-tender, nondistended. Normal bowel sounds. Central nervous system: Alert and oriented. No focal neurological deficits. Extremities: No cyanosis, clubbing or edema- noted pain in right shoulder and difficulty lifting right arm Skin: No rashes or ulcers Psychiatry:  Mood & affect appropriate.     Data Reviewed: I have personally reviewed following labs and imaging studies  CBC: Recent Labs  Lab 12/09/18 0339 12/09/18  1502 12/10/18 0954 12/11/18 0430  WBC 11.6* 8.1 13.3* 14.2*  NEUTROABS 8.9*  --   --   --   HGB 7.3* 7.4* 8.5* 7.7*  HCT 22.9* 23.3* 26.2* 23.3*  MCV 83.0 83.5 82.6 81.8  PLT 193 194 197 381   Basic Metabolic Panel: Recent Labs  Lab 12/09/18 0339 12/09/18 1502 12/10/18 0954 12/10/18 1411 12/11/18 0430  NA 133* 136 139 138 138  K 4.1 4.9 5.8* 5.5* 5.3*  CL 105 105 109 105 102  CO2 14* 12* 14* 16* 18*  GLUCOSE 164* 254* 174* 180* 135*  BUN 83* 89* 110* 116* 127*  CREATININE 8.33* 8.59* 8.27* 7.97* 7.74*  CALCIUM 6.4*  6.8* 6.7* 6.9* 7.3*  MG 2.0  --   --   --   --    GFR: Estimated Creatinine Clearance: 11.9 mL/min (A) (by C-G formula based on SCr of 7.74 mg/dL (H)). Liver Function Tests: Recent Labs  Lab 12/10/18 1411  AST 15  ALT 12  ALKPHOS 98  BILITOT 0.5  PROT 7.1  ALBUMIN 2.2*   No results for input(s): LIPASE, AMYLASE in the last 168 hours. No results for input(s): AMMONIA in the last 168 hours. Coagulation Profile: No results for input(s): INR, PROTIME in the last 168 hours. Cardiac Enzymes: No results for input(s): CKTOTAL, CKMB, CKMBINDEX, TROPONINI in the last 168 hours. BNP (last 3 results) No results for input(s): PROBNP in the last 8760 hours. HbA1C: Recent Labs    12/09/18 1825  HGBA1C 6.1*   CBG: No results for input(s): GLUCAP in the last 168 hours. Lipid Profile: No results for input(s): CHOL, HDL, LDLCALC, TRIG, CHOLHDL, LDLDIRECT in the last 72 hours. Thyroid Function Tests: No results for input(s): TSH, T4TOTAL, FREET4, T3FREE, THYROIDAB in the last 72 hours. Anemia Panel: Recent Labs    12/09/18 1502  VITAMINB12 701  FOLATE 15.3  FERRITIN 211  TIBC 217*  IRON 19*  RETICCTPCT 0.5   Urine analysis:    Component Value Date/Time   COLORURINE YELLOW 12/09/2018 1425   APPEARANCEUR TURBID (A) 12/09/2018 1425   LABSPEC 1.010 12/09/2018 1425   PHURINE 5.0 12/09/2018 1425   GLUCOSEU 150 (A) 12/09/2018 1425   HGBUR LARGE (A) 12/09/2018 1425   BILIRUBINUR NEGATIVE 12/09/2018 1425   KETONESUR NEGATIVE 12/09/2018 1425   PROTEINUR 100 (A) 12/09/2018 1425   NITRITE POSITIVE (A) 12/09/2018 1425   LEUKOCYTESUR LARGE (A) 12/09/2018 1425   Sepsis Labs: @LABRCNTIP (procalcitonin:4,lacticidven:4) )No results found for this or any previous visit (from the past 240 hour(s)).       Radiology Studies: US Renal  Result Date: 12/09/2018 CLINICAL DATA:  Acute renal failure EXAM: RENAL / URINARY TRACT ULTRASOUND COMPLETE COMPARISON:  None. FINDINGS: Right Kidney:  Renal measurements: 13.2 x 5.6 x 7.9 cm = volume: 307 mL. Parenchyma is echogenic compared to the adjacent liver. No focal lesion or hydronephrosis. Left Kidney: Renal measurements: 13.5 x 6.6 x 6.7 cm = volume: 313 mL. Echogenicity within normal limits. No mass or hydronephrosis visualized. Bladder: Physiologically distended. Focal area of smooth wall thickening posteriorly measuring 2.7 x 1.3 x 4 cm. Incidental note made of splenomegaly measuring at least 14.4 cm length. IMPRESSION: 1. No hydronephrosis. 2. Echogenic renal parenchyma, a nonspecific indicator of medical renal disease 3. Focal wall thickening in the urinary bladder. Consider urologic evaluation. 4. Splenomegaly Electronically Signed   By: Lucrezia Europe M.D.   On: 12/09/2018 16:19      Scheduled Meds: . famotidine  20 mg Oral  QHS  . heparin  5,000 Units Subcutaneous Q8H  . methocarbamol  500 mg Oral BID  . nicotine  21 mg Transdermal Daily  . predniSONE  40 mg Oral Q breakfast   Continuous Infusions: .  sodium bicarbonate (isotonic) infusion in sterile water 125 mL/hr at 12/11/18 0541     LOS: 0 days    Time spent in minutes: 35    Debbe Odea, MD [facility] Pager: [email or web address] Password [facility] 12/11/2018, 12:44 PM

## 2018-12-11 NOTE — ED Notes (Signed)
Pt ambulatory back from lobby, labored and wheezing noted. Pt reports this is normal for his breathing to become labored with ambulation long distances

## 2018-12-12 DIAGNOSIS — J441 Chronic obstructive pulmonary disease with (acute) exacerbation: Secondary | ICD-10-CM

## 2018-12-12 LAB — BASIC METABOLIC PANEL
Anion gap: 17 — ABNORMAL HIGH (ref 5–15)
BUN: 133 mg/dL — AB (ref 6–20)
CALCIUM: 8.1 mg/dL — AB (ref 8.9–10.3)
CO2: 22 mmol/L (ref 22–32)
CREATININE: 7.3 mg/dL — AB (ref 0.61–1.24)
Chloride: 103 mmol/L (ref 98–111)
GFR calc Af Amer: 9 mL/min — ABNORMAL LOW (ref >60)
GFR calc non Af Amer: 8 mL/min — ABNORMAL LOW (ref >60)
Glucose, Bld: 164 mg/dL — ABNORMAL HIGH (ref 70–99)
Potassium: 4.2 mmol/L (ref 3.5–5.1)
Sodium: 142 mmol/L (ref 135–145)

## 2018-12-12 LAB — PROTEIN ELECTROPHORESIS, SERUM
A/G RATIO SPE: 0.6 — AB (ref 0.7–1.7)
A/G Ratio: 0.6 — ABNORMAL LOW (ref 0.7–1.7)
Albumin ELP: 2.6 g/dL — ABNORMAL LOW (ref 2.9–4.4)
Albumin ELP: 2.7 g/dL — ABNORMAL LOW (ref 2.9–4.4)
Alpha-1-Globulin: 0.5 g/dL — ABNORMAL HIGH (ref 0.0–0.4)
Alpha-1-Globulin: 0.5 g/dL — ABNORMAL HIGH (ref 0.0–0.4)
Alpha-2-Globulin: 1 g/dL (ref 0.4–1.0)
Alpha-2-Globulin: 1.2 g/dL — ABNORMAL HIGH (ref 0.4–1.0)
Beta Globulin: 1 g/dL (ref 0.7–1.3)
Beta Globulin: 0.9 g/dL (ref 0.7–1.3)
Gamma Globulin: 1.7 g/dL (ref 0.4–1.8)
Gamma Globulin: 1.6 g/dL (ref 0.4–1.8)
Globulin, Total: 4.1 g/dL — ABNORMAL HIGH (ref 2.2–3.9)
Globulin, Total: 4.2 g/dL — ABNORMAL HIGH (ref 2.2–3.9)
Total Protein ELP: 6.7 g/dL (ref 6.0–8.5)
Total Protein ELP: 6.9 g/dL (ref 6.0–8.5)

## 2018-12-12 LAB — ANCA TITERS
Atypical P-ANCA titer: <1:20 {titer}
C-ANCA: <1:20 {titer}
P-ANCA: <1:20 {titer}

## 2018-12-12 LAB — GLOMERULAR BASEMENT MEMBRANE ANTIBODIES
GBM Ab: 3 units (ref 0–20)
GBM Ab: 3 units (ref 0–20)

## 2018-12-12 LAB — MPO/PR-3 (ANCA) ANTIBODIES
ANCA Proteinase 3: <3.5 U/mL (ref 0.0–3.5)
Myeloperoxidase Abs: <9 U/mL (ref 0.0–9.0)

## 2018-12-12 LAB — HEPATITIS B CORE ANTIBODY, TOTAL: Hep B Core Total Ab: NEGATIVE

## 2018-12-12 LAB — C4 COMPLEMENT: COMPLEMENT C4, BODY FLUID: 40 mg/dL (ref 14–44)

## 2018-12-12 LAB — ANTINUCLEAR ANTIBODIES, IFA
ANA Ab, IFA: NEGATIVE
ANA Ab, IFA: NEGATIVE

## 2018-12-12 LAB — HEPATITIS B SURFACE ANTIBODY,QUALITATIVE: Hep B S Ab: REACTIVE

## 2018-12-12 LAB — C3 COMPLEMENT: C3 Complement: 136 mg/dL (ref 82–167)

## 2018-12-12 LAB — ANTISTREPTOLYSIN O TITER: ASO: 90 [IU]/mL (ref 0.0–200.0)

## 2018-12-12 LAB — OCCULT BLOOD X 1 CARD TO LAB, STOOL: Fecal Occult Bld: POSITIVE — AB

## 2018-12-12 NOTE — Progress Notes (Signed)
Patient alert and oriented x4, complains of pain to right torn rotator cuff 6/10, relieved by prn pain medications.  Patient is a re-admit from a recent [place], his son informed me that he was visiting the patient and the patient was sorting through his pants pockets when a syringe dropped out. The patient has a history of IV drug use.  Security was called for search and recovery.  Patient was compliant with security, he verbalized that the syringe was used for "my puppies, I have to give them their vaccines with this needle".  Patient was educated on the importance of not traveling with needles in his pockets.  Patient was apologetic and pleasant for the remainder of shift. Stable condition, will continue to monitor.

## 2018-12-12 NOTE — Discharge Summary (Deleted)
  The note originally documented on this encounter has been moved the the encounter in which it belongs.  

## 2018-12-12 NOTE — Progress Notes (Signed)
Pt walked up to the nurses station and asked for discharge papers and prescriptions. Pt was counseled on AMA and that means no dc papers or prescriptions.  Dr. Wynelle Cleveland was paged.

## 2018-12-12 NOTE — Discharge Summary (Signed)
Physician Discharge Summary  Seith Aikey Banos YTK:[id number] DOB: 03-17-1971 DOA: 12/10/2018  PCP: [name]  Admit date: 12/10/2018 Discharge date: 12/12/2018  Admitted From:home      The patient has left AMA again. He left AMA on 12/14 but his family sent him back and he was readmitted.    Discharge Diagnoses:  Principal Problem:   Acute kidney failure ([place]) Active Problems:   COPD with acute exacerbation (H[facility])   Anemia   Hx of hepatitis C   Injury of right rotator cuff    Subjective: I spoke with the patient today. He was insisting that he had to leave. I spoke with him in length to try to convince him to stay for treatment of his COPD exacrbation and adequate work up for AKI.  Of note note in chart from RN this AM state " his son informed me that he was visiting the patient and the patient was sorting through his pants pockets when a syringe dropped out". A syringe was found in his room. Per RN note he stated that it was used for his puppies. See note from 12/16 today.   Please see my last progress note from yesterday. No prescriptions were given today.      Discharge Exam: Vitals:   12/12/18 0223 12/12/18 0532  BP:  139/68  Pulse:  78  Resp:  18  Temp:  97.8 F (36.6 C)  SpO2: 96% 97%   Vitals:   12/11/18 2113 12/11/18 2148 12/12/18 0223 12/12/18 0532  BP:  (!) 141/84  139/68  Pulse:  96  78  Resp:  20  18  Temp:  98.2 F (36.8 C)  97.8 F (36.6 C)  TempSrc:  Oral  Oral  SpO2: 96% 99% 96% 97%  Weight:      Height:        General: Pt is alert, awake, not in acute distress Cardiovascular: RRR, S1/S2 +, no rubs, no gallops Respiratory: b/l rhonchi Abdominal: Soft, NT, ND, bowel sounds + Extremities: no edema, no cyanosis   Discharge Instructions   Allergies as of 12/12/2018   No Known Allergies     Medication List    ASK your doctor about these medications   albuterol (2.5 MG/3ML) 0.083% nebulizer solution Commonly known as:   PROVENTIL Take 2.5 mg by nebulization every 6 (six) hours as needed for wheezing or shortness of breath.   albuterol 108 (90 Base) MCG/ACT inhaler Commonly known as:  PROVENTIL HFA;VENTOLIN HFA Inhale into the lungs every 6 (six) hours as needed for wheezing or shortness of breath.   diclofenac 75 MG EC tablet Commonly known as:  VOLTAREN Take 1 tablet (75 mg total) by mouth 2 (two) times daily.   methocarbamol 500 MG tablet Commonly known as:  ROBAXIN Take 1 tablet (500 mg total) by mouth 2 (two) times daily.       No Known Allergies   Procedures/Studies:    Dg Chest 2 View  Result Date: 12/09/2018 CLINICAL DATA:  Increased cough and shortness of breath. COPD. EXAM: CHEST - 2 VIEW COMPARISON:  01/23/2017. FINDINGS: Normal sized heart. Clear lungs. The lungs remain hyperexpanded with mild peribronchial thickening. Cervical spine fixation hardware. Mild scoliosis. IMPRESSION: Stable changes of COPD and chronic bronchitis. No acute abnormality. Electronically Signed   By: Claudie Revering M.D.   On: 12/09/2018 03:07   US Renal  Result Date: 12/09/2018 CLINICAL DATA:  Acute renal failure EXAM: RENAL / URINARY TRACT ULTRASOUND COMPLETE COMPARISON:  None. FINDINGS:  Right Kidney: Renal measurements: 13.2 x 5.6 x 7.9 cm = volume: 307 mL. Parenchyma is echogenic compared to the adjacent liver. No focal lesion or hydronephrosis. Left Kidney: Renal measurements: 13.5 x 6.6 x 6.7 cm = volume: 313 mL. Echogenicity within normal limits. No mass or hydronephrosis visualized. Bladder: Physiologically distended. Focal area of smooth wall thickening posteriorly measuring 2.7 x 1.3 x 4 cm. Incidental note made of splenomegaly measuring at least 14.4 cm length. IMPRESSION: 1. No hydronephrosis. 2. Echogenic renal parenchyma, a nonspecific indicator of medical renal disease 3. Focal wall thickening in the urinary bladder. Consider urologic evaluation. 4. Splenomegaly Electronically Signed   By: Lucrezia Europe  M.D.   On: 12/09/2018 16:19   Dg Humerus Right  Result Date: 12/09/2018 CLINICAL DATA:  47 year old male with reported lump in the right mid arm. EXAM: RIGHT HUMERUS - 2+ VIEW COMPARISON:  Right shoulder radiograph dated 09/22/2018 FINDINGS: There is an area of periosteal thickening in the mid right humeral diaphysis which is not evaluated on this radiograph but may be related to chronic infection or stress. Further evaluation with MRI on a nonemergent basis recommended. There is no acute fracture or dislocation. There is elevated appearance of the humeral head in relation to the glenoid which may be partly positional but is concerning for chronic rotator cuff injury. Evaluation of the shoulder is limited on this radiograph. If there is pain over the shoulder dedicated shoulder radiograph is recommended for better evaluation. The soft tissues are unremarkable. IMPRESSION: 1. No acute fracture or dislocation. 2. Focal area of periosteal thickening in the mid right humeral diaphysis possibly related to chronic infection or stress. Further evaluation with MRI on a nonemergent basis recommended. 3. Elevation of the humeral head in relation to the glenoid, concerning for chronic rotator cuff injury. If there is pain over the right shoulder dedicated radiograph of the shoulder recommended for further evaluation. Electronically Signed   By: Anner Crete M.D.   On: 12/09/2018 03:17      The results of significant diagnostics from this hospitalization (including imaging, microbiology, ancillary and laboratory) are listed below for reference.     Microbiology: No results found for this or any previous visit (from the past 240 hour(s)).   Labs: BNP (last 3 results) No results for input(s): BNP in the last 8760 hours. Basic Metabolic Panel: Recent Labs  Lab 12/09/18 0339 12/09/18 1502 12/10/18 0954 12/10/18 1411 12/11/18 0430 12/12/18 0307  NA 133* 136 139 138 138 142  K 4.1 4.9 5.8* 5.5* 5.3*  4.2  CL 105 105 109 105 102 103  CO2 14* 12* 14* 16* 18* 22  GLUCOSE 164* 254* 174* 180* 135* 164*  BUN 83* 89* 110* 116* 127* 133*  CREATININE 8.33* 8.59* 8.27* 7.97* 7.74* 7.30*  CALCIUM 6.4* 6.8* 6.7* 6.9* 7.3* 8.1*  MG 2.0  --   --   --   --   --    Liver Function Tests: Recent Labs  Lab 12/10/18 1411  AST 15  ALT 12  ALKPHOS 98  BILITOT 0.5  PROT 7.1  ALBUMIN 2.2*   No results for input(s): LIPASE, AMYLASE in the last 168 hours. No results for input(s): AMMONIA in the last 168 hours. CBC: Recent Labs  Lab 12/09/18 0339 12/09/18 1502 12/10/18 0954 12/11/18 0430  WBC 11.6* 8.1 13.3* 14.2*  NEUTROABS 8.9*  --   --   --   HGB 7.3* 7.4* 8.5* 7.7*  HCT 22.9* 23.3* 26.2* 23.3*  MCV 83.0  83.5 82.6 81.8  PLT 193 194 197 264   Cardiac Enzymes: No results for input(s): CKTOTAL, CKMB, CKMBINDEX, TROPONINI in the last 168 hours. BNP: Invalid input(s): POCBNP CBG: No results for input(s): GLUCAP in the last 168 hours. D-Dimer No results for input(s): DDIMER in the last 72 hours. Hgb A1c Recent Labs    12/09/18 1825  HGBA1C 6.1*   Lipid Profile No results for input(s): CHOL, HDL, LDLCALC, TRIG, CHOLHDL, LDLDIRECT in the last 72 hours. Thyroid function studies No results for input(s): TSH, T4TOTAL, T3FREE, THYROIDAB in the last 72 hours.  Invalid input(s): FREET3 Anemia work up No results for input(s): VITAMINB12, FOLATE, FERRITIN, TIBC, IRON, RETICCTPCT in the last 72 hours. Urinalysis    Component Value Date/Time   COLORURINE YELLOW 12/09/2018 1425   APPEARANCEUR TURBID (A) 12/09/2018 1425   LABSPEC 1.010 12/09/2018 1425   PHURINE 5.0 12/09/2018 1425   GLUCOSEU 150 (A) 12/09/2018 1425   HGBUR LARGE (A) 12/09/2018 1425   BILIRUBINUR NEGATIVE 12/09/2018 1425   KETONESUR NEGATIVE 12/09/2018 1425   PROTEINUR 100 (A) 12/09/2018 1425   NITRITE POSITIVE (A) 12/09/2018 1425   LEUKOCYTESUR LARGE (A) 12/09/2018 1425   Sepsis Labs Invalid input(s): PROCALCITONIN,   WBC,  LACTICIDVEN Microbiology No results found for this or any previous visit (from the past 240 hour(s)).   Time coordinating discharge in minutes: 35  SIGNED:   Debbe Odea, MD  [facility] 12/12/2018, 5:03 PM Pager   If 7PM-7AM, please contact night-coverage [email or web address] Password [email or web address]

## 2018-12-12 NOTE — Progress Notes (Signed)
Date: 12/12/2018 Patient: Ernest Reese Admitted: 12/10/2018 11:35 PM Attending Provider: Debbe Odea, MD  [place] or  has made the decision to leave the hospital against the advice of Debbe Odea, MD.  He has been informed and understands the inherent risks, including death.  He has decided to accept the responsibility for this decision. Ernest Reese has been advised that he may return for further evaluation or treatment. His condition at time of discharge was alert, oriented, ambulatory.  Ernest Reese had current vital signs as follows:  Blood pressure 139/68, pulse 78, temperature 97.8 F (36.6 C), temperature source Oral, resp. rate 18, height 6' 0.01" (1.829 m), weight 70.3 kg, SpO2 97 %.   Claudina Lick Solana  signed the Leaving Against Medical Advice form prior to leaving the department.  Ernest Reese 12/12/2018

## 2018-12-14 LAB — HCV RNA QUANT

## 2018-12-18 LAB — DRUG PROFILE, UR, 9 DRUGS (LABCORP)
Amphetamines, Urine: NEGATIVE ng/mL
Barbiturate, Ur: NEGATIVE ng/mL
Benzodiazepine Quant, Ur: NEGATIVE ng/mL
Cannabinoid Quant, Ur: NEGATIVE ng/mL
Cocaine (Metab.): NEGATIVE ng/mL
Methadone Screen, Urine: NEGATIVE ng/mL
Opiate Quant, Ur: NEGATIVE ng/mL
Phencyclidine, Ur: NEGATIVE ng/mL
Propoxyphene, Urine: NEGATIVE ng/mL

## 2018-12-21 NOTE — Discharge Summary (Addendum)
Physician Discharge Summary  Ernest Reese JME:[id number] DOB: [birth year] DOA: 12/09/2018  PCP: [name]  Admit date: 12/09/2018 Discharge date: 12/10/2018   The patient left AMA in the evening close to 9 PM. Please see my progress note from 12/14.    Discharge Diagnoses:  Principal Problem:   Acute kidney failure ([place]) Active Problems:   COPD with acute exacerbation ([name])   Anemia   ARF (acute renal failure) (HCC)   Malnutrition of moderate degree     Discharge Exam: Vitals:   12/10/18 0909 12/10/18 1620  BP: 117/76 130/77  Pulse: 86 96  Resp: 18 18  Temp: 97.8 F (36.6 C) (!) 97.4 F (36.3 C)  SpO2: 97% 98%   Vitals:   12/10/18 0546 12/10/18 0725 12/10/18 0909 12/10/18 1620  BP: 115/80 128/78 117/76 130/77  Pulse: 89 97 86 96  Resp: 18 18 18 18   Temp: 97.9 F (36.6 C) 98.8 F (37.1 C) 97.8 F (36.6 C) (!) 97.4 F (36.3 C)  TempSrc: Oral Oral Oral Oral  SpO2: 99% 98% 97% 98%  Weight:      Height:           Discharge Instructions   Allergies as of 12/10/2018   No Known Allergies     Medication List    ASK your doctor about these medications   albuterol (2.5 MG/3ML) 0.083% nebulizer solution Commonly known as:  PROVENTIL Take 2.5 mg by nebulization every 6 (six) hours as needed for wheezing or shortness of breath.   albuterol 108 (90 Base) MCG/ACT inhaler Commonly known as:  PROVENTIL HFA;VENTOLIN HFA Inhale into the lungs every 6 (six) hours as needed for wheezing or shortness of breath.   diclofenac 75 MG EC tablet Commonly known as:  VOLTAREN Take 1 tablet (75 mg total) by mouth 2 (two) times daily.   methocarbamol 500 MG tablet Commonly known as:  ROBAXIN Take 1 tablet (500 mg total) by mouth 2 (two) times daily.       No Known Allergies   Procedures/Studies:    Dg Chest 2 View  Result Date: 12/09/2018 CLINICAL DATA:  Increased cough and shortness of breath. COPD. EXAM: CHEST - 2 VIEW COMPARISON:  01/23/2017.  FINDINGS: Normal sized heart. Clear lungs. The lungs remain hyperexpanded with mild peribronchial thickening. Cervical spine fixation hardware. Mild scoliosis. IMPRESSION: Stable changes of COPD and chronic bronchitis. No acute abnormality. Electronically Signed   By: Claudie Revering M.D.   On: 12/09/2018 03:07   US Renal  Result Date: 12/09/2018 CLINICAL DATA:  Acute renal failure EXAM: RENAL / URINARY TRACT ULTRASOUND COMPLETE COMPARISON:  None. FINDINGS: Right Kidney: Renal measurements: 13.2 x 5.6 x 7.9 cm = volume: 307 mL. Parenchyma is echogenic compared to the adjacent liver. No focal lesion or hydronephrosis. Left Kidney: Renal measurements: 13.5 x 6.6 x 6.7 cm = volume: 313 mL. Echogenicity within normal limits. No mass or hydronephrosis visualized. Bladder: Physiologically distended. Focal area of smooth wall thickening posteriorly measuring 2.7 x 1.3 x 4 cm. Incidental note made of splenomegaly measuring at least 14.4 cm length. IMPRESSION: 1. No hydronephrosis. 2. Echogenic renal parenchyma, a nonspecific indicator of medical renal disease 3. Focal wall thickening in the urinary bladder. Consider urologic evaluation. 4. Splenomegaly Electronically Signed   By: Lucrezia Europe M.D.   On: 12/09/2018 16:19   Dg Humerus Right  Result Date: 12/09/2018 CLINICAL DATA:  47 year old male with reported lump in the right mid arm. EXAM: RIGHT HUMERUS - 2+ VIEW COMPARISON:  Right shoulder radiograph dated 09/22/2018 FINDINGS: There is an area of periosteal thickening in the mid right humeral diaphysis which is not evaluated on this radiograph but may be related to chronic infection or stress. Further evaluation with MRI on a nonemergent basis recommended. There is no acute fracture or dislocation. There is elevated appearance of the humeral head in relation to the glenoid which may be partly positional but is concerning for chronic rotator cuff injury. Evaluation of the shoulder is limited on this radiograph. If  there is pain over the shoulder dedicated shoulder radiograph is recommended for better evaluation. The soft tissues are unremarkable. IMPRESSION: 1. No acute fracture or dislocation. 2. Focal area of periosteal thickening in the mid right humeral diaphysis possibly related to chronic infection or stress. Further evaluation with MRI on a nonemergent basis recommended. 3. Elevation of the humeral head in relation to the glenoid, concerning for chronic rotator cuff injury. If there is pain over the right shoulder dedicated radiograph of the shoulder recommended for further evaluation. Electronically Signed   By: Anner Crete M.D.   On: 12/09/2018 03:17      The results of significant diagnostics from this hospitalization (including imaging, microbiology, ancillary and laboratory) are listed below for reference.     Microbiology: No results found for this or any previous visit (from the past 240 hour(s)).   Labs: BNP (last 3 results) No results for input(s): BNP in the last 8760 hours. Basic Metabolic Panel: No results for input(s): NA, K, CL, CO2, GLUCOSE, BUN, CREATININE, CALCIUM, MG, PHOS in the last 168 hours. Liver Function Tests: No results for input(s): AST, ALT, ALKPHOS, BILITOT, PROT, ALBUMIN in the last 168 hours. No results for input(s): LIPASE, AMYLASE in the last 168 hours. No results for input(s): AMMONIA in the last 168 hours. CBC: No results for input(s): WBC, NEUTROABS, HGB, HCT, MCV, PLT in the last 168 hours. Cardiac Enzymes: No results for input(s): CKTOTAL, CKMB, CKMBINDEX, TROPONINI in the last 168 hours. BNP: Invalid input(s): POCBNP CBG: No results for input(s): GLUCAP in the last 168 hours. D-Dimer No results for input(s): DDIMER in the last 72 hours. Hgb A1c No results for input(s): HGBA1C in the last 72 hours. Lipid Profile No results for input(s): CHOL, HDL, LDLCALC, TRIG, CHOLHDL, LDLDIRECT in the last 72 hours. Thyroid function studies No results for  input(s): TSH, T4TOTAL, T3FREE, THYROIDAB in the last 72 hours.  Invalid input(s): FREET3 Anemia work up No results for input(s): VITAMINB12, FOLATE, FERRITIN, TIBC, IRON, RETICCTPCT in the last 72 hours. Urinalysis    Component Value Date/Time   COLORURINE YELLOW 12/09/2018 1425   APPEARANCEUR TURBID (A) 12/09/2018 1425   LABSPEC 1.010 12/09/2018 1425   PHURINE 5.0 12/09/2018 1425   GLUCOSEU 150 (A) 12/09/2018 1425   HGBUR LARGE (A) 12/09/2018 1425   BILIRUBINUR NEGATIVE 12/09/2018 1425   KETONESUR NEGATIVE 12/09/2018 1425   PROTEINUR 100 (A) 12/09/2018 1425   NITRITE POSITIVE (A) 12/09/2018 1425   LEUKOCYTESUR LARGE (A) 12/09/2018 1425   Sepsis Labs Invalid input(s): PROCALCITONIN,  WBC,  LACTICIDVEN Microbiology No results found for this or any previous visit (from the past 240 hour(s)).     SIGNED:   Debbe Odea, MD  [facility] 12/21/2018, 5:21 PM Pager   If 7PM-7AM, please contact night-coverage [email or web address] Password [email or web address]

## 2019-07-14 HISTORY — PX: CHEST TUBE INSERTION: SHX231

## 2019-07-14 HISTORY — PX: EXPLORATORY LAPAROTOMY: SUR591

## 2019-07-17 HISTORY — PX: OTHER SURGICAL HISTORY: SHX169

## 2019-07-17 HISTORY — DX: Other motorcycle driver injured in collision with unspecified motor vehicles in traffic accident, initial encounter: V29.408A

## 2019-07-18 DIAGNOSIS — J690 Pneumonitis due to inhalation of food and vomit: Secondary | ICD-10-CM

## 2019-07-18 HISTORY — DX: Pneumonitis due to inhalation of food and vomit: J69.0

## 2020-08-02 ENCOUNTER — Emergency Department (HOSPITAL_COMMUNITY)
Admission: EM | Admit: 2020-08-02 | Discharge: 2020-08-02 | Disposition: A | Discharge status: Home / Self Care | Payer: Medicare Other | Attending: Emergency Medicine | Admitting: Emergency Medicine

## 2020-08-02 ENCOUNTER — Other Ambulatory Visit: Payer: Self-pay

## 2020-08-02 ENCOUNTER — Encounter (HOSPITAL_COMMUNITY): Payer: Self-pay

## 2020-08-02 ENCOUNTER — Emergency Department (HOSPITAL_COMMUNITY): Payer: Medicare Other

## 2020-08-02 DIAGNOSIS — F17210 Nicotine dependence, cigarettes, uncomplicated: Secondary | ICD-10-CM | POA: Insufficient documentation

## 2020-08-02 DIAGNOSIS — J441 Chronic obstructive pulmonary disease with (acute) exacerbation: Secondary | ICD-10-CM | POA: Insufficient documentation

## 2020-08-02 DIAGNOSIS — Z20822 Contact with and (suspected) exposure to covid-19: Secondary | ICD-10-CM | POA: Insufficient documentation

## 2020-08-02 LAB — BASIC METABOLIC PANEL
Anion gap: 9 (ref 5–15)
BUN: 15 mg/dL (ref 6–20)
CO2: 29 mmol/L (ref 22–32)
Calcium: 8.8 mg/dL — ABNORMAL LOW (ref 8.9–10.3)
Chloride: 99 mmol/L (ref 98–111)
Creatinine, Ser: 1.14 mg/dL (ref 0.61–1.24)
GFR calc Af Amer: >60 mL/min (ref >60)
GFR calc non Af Amer: >60 mL/min (ref >60)
Glucose, Bld: 89 mg/dL (ref 70–99)
Potassium: 4.2 mmol/L (ref 3.5–5.1)
Sodium: 137 mmol/L (ref 135–145)

## 2020-08-02 LAB — SARS CORONAVIRUS 2 BY RT PCR (HOSPITAL ORDER, PERFORMED IN ~~LOC~~ HOSPITAL LAB): SARS Coronavirus 2: NEGATIVE

## 2020-08-02 LAB — CBC
HCT: 41.6 % (ref 39.0–52.0)
Hemoglobin: 13.2 g/dL (ref 13.0–17.0)
MCH: 26.7 pg (ref 26.0–34.0)
MCHC: 31.7 g/dL (ref 30.0–36.0)
MCV: 84 fL (ref 80.0–100.0)
Platelets: 280 10*3/uL (ref 150–400)
RBC: 4.95 MIL/uL (ref 4.22–5.81)
RDW: 17.6 % — ABNORMAL HIGH (ref 11.5–15.5)
WBC: 9.4 10*3/uL (ref 4.0–10.5)
nRBC: 0 % (ref 0.0–0.2)

## 2020-08-02 MED ORDER — METHYLPREDNISOLONE SODIUM SUCC 125 MG IJ SOLR
125.0000 mg | Freq: Once | INTRAMUSCULAR | Status: AC
  Administered 2020-08-02: 125 mg via INTRAVENOUS
  Filled 2020-08-02: qty 2

## 2020-08-02 MED ORDER — SODIUM CHLORIDE 0.9 % IV BOLUS
1000.0000 mL | Freq: Once | INTRAVENOUS | Status: AC
  Administered 2020-08-02: 1000 mL via INTRAVENOUS

## 2020-08-02 MED ORDER — ALBUTEROL SULFATE HFA 108 (90 BASE) MCG/ACT IN AERS
6.0000 | INHALATION_SPRAY | Freq: Once | RESPIRATORY_TRACT | Status: AC
  Administered 2020-08-02: 6 via RESPIRATORY_TRACT
  Filled 2020-08-02: qty 6.7

## 2020-08-02 MED ORDER — ALBUTEROL SULFATE (5 MG/ML) 0.5% IN NEBU
2.5000 mg | INHALATION_SOLUTION | Freq: Four times a day (QID) | RESPIRATORY_TRACT | 2 refills | Status: DC | PRN
Start: 2020-08-02 — End: 2021-02-06

## 2020-08-02 MED ORDER — MAGNESIUM SULFATE 50 % IJ SOLN: 1.0000 g | Freq: Once | INTRAMUSCULAR | Status: DC

## 2020-08-02 MED ORDER — MAGNESIUM SULFATE IN D5W 1-5 GM/100ML-% IV SOLN
1.0000 g | Freq: Once | INTRAVENOUS | Status: AC
  Administered 2020-08-02: 1 g via INTRAVENOUS
  Filled 2020-08-02: qty 100

## 2020-08-02 MED ORDER — IPRATROPIUM-ALBUTEROL 0.5-2.5 (3) MG/3ML IN SOLN
3.0000 mL | Freq: Once | RESPIRATORY_TRACT | Status: AC
  Administered 2020-08-02: 3 mL via RESPIRATORY_TRACT
  Filled 2020-08-02: qty 3

## 2020-08-02 MED ORDER — PREDNISONE 10 MG PO TABS
50.0000 mg | ORAL_TABLET | Freq: Every day | ORAL | 0 refills | Status: AC
Start: 2020-08-02 — End: 2020-08-07

## 2020-08-02 NOTE — ED Provider Notes (Signed)
[facility] DEPT Provider Note   CSN: [id number] Arrival date & time: 08/02/20  1137     History Chief Complaint  Patient presents with  . COPD    Ernest Reese is a 49 y.o. male with history of COPD, anemia, hepatitis C presents for evaluation of acute onset, progressively worsening shortness of breath for 3 days.  Reports dyspnea on exertion states he can ambulate more than 20 feet without feeling very winded.  Reports chest tightness as well as a cough.  He states "it feels like it is all stuck in my chest and if I could just cough it up I would feel better".  He states has been using his albuterol inhaler 5-6 times daily with minimal relief.  Denies fevers, abdominal pain, nausea, vomiting.  He is a current smoker of approximately a pack of cigarettes daily and is trying to "cut back".  He is currently outtpatient treatment for opioid dependence with whom he follows up daily.      The history is provided by the patient.       Past Medical History:  Diagnosis Date  . Emphysema lung ([place])   . Testicular cancer [facility])     Patient Active Problem List   Diagnosis Date Noted  . Hx of hepatitis C 12/11/2018  . Injury of right rotator cuff 12/11/2018  . Renal insufficiency 12/09/2018  . COPD with acute exacerbation ([place]) 12/09/2018  . Acute kidney failure ([place]) 12/09/2018  . Anemia 12/09/2018  . ARF (acute renal failure) ([place]) 12/09/2018  . Malnutrition of moderate degree 12/09/2018  . Emphysema lung ([place])     Past Surgical History:  Procedure Laterality Date  . HAND SURGERY    . NECK SURGERY    . REPLACEMENT TOTAL KNEE    . testicle removed         Family History  Problem Relation Age of Onset  . Cancer Father     Social History   Tobacco Use  . Smoking status: Current Every Day Smoker    Packs/day: 1.00    Types: Cigarettes  . Smokeless tobacco: Never Used  Vaping Use  . Vaping Use: Never used  Substance Use Topics  .  Alcohol use: Not Currently  . Drug use: Never    Home Medications Prior to Admission medications   Medication Sig Start Date End Date Taking? Authorizing Provider  albuterol (PROVENTIL HFA;VENTOLIN HFA) 108 (90 Base) MCG/ACT inhaler Inhale into the lungs every 6 (six) hours as needed for wheezing or shortness of breath.   Yes [provider]  gabapentin (NEURONTIN) 100 MG capsule Take 100 mg by mouth 2 (two) times daily. 05/06/20  Yes [provider]  methadone (DOLOPHINE) 10 MG/5ML solution Take 44 mg by mouth daily.   Yes [provider]  albuterol (PROVENTIL) (5 MG/ML) 0.5% nebulizer solution Take 0.5 mLs (2.5 mg total) by nebulization every 6 (six) hours as needed for wheezing or shortness of breath. 08/02/20   Dayja Loveridge A, PA-C  diclofenac (VOLTAREN) 75 MG EC tablet Take 1 tablet (75 mg total) by mouth 2 (two) times daily. Patient not taking: Reported on 12/10/2018 09/22/18   Fransico Meadow, PA-C  methocarbamol (ROBAXIN) 500 MG tablet Take 1 tablet (500 mg total) by mouth 2 (two) times daily. Patient not taking: Reported on 12/10/2018 09/22/18   Fransico Meadow, PA-C  predniSONE (DELTASONE) 10 MG tablet Take 5 tablets (50 mg total) by mouth daily with breakfast for 5 days.  08/02/20 08/07/20  Rodell Perna A, PA-C    Allergies    Patient has no known allergies.  Review of Systems   Review of Systems  Constitutional: Negative for fever.  Respiratory: Positive for cough, chest tightness, shortness of breath and wheezing.   Cardiovascular: Negative for leg swelling.  Gastrointestinal: Negative for abdominal pain, nausea and vomiting.  All other systems reviewed and are negative.   Physical Exam Updated Vital Signs BP 128/89   Pulse 89   Temp (!) 97.4 F (36.3 C)   Resp 17   Ht 6' (1.829 m)   Wt 63.5 kg   SpO2 100%   BMI 18.99 kg/m   Physical Exam Vitals and nursing note reviewed.  Constitutional:      General: He is not in acute distress.     Appearance: He is well-developed.  HENT:     Head: Normocephalic and atraumatic.  Eyes:     General:        Right eye: No discharge.        Left eye: No discharge.     Conjunctiva/sclera: Conjunctivae normal.  Neck:     Vascular: No JVD.     Trachea: No tracheal deviation.  Cardiovascular:     Rate and Rhythm: Normal rate and regular rhythm.     Pulses: Normal pulses.     Heart sounds: Normal heart sounds.  Pulmonary:     Breath sounds: Wheezing present.     Comments: SPO2 saturations 88% on room air, improved on 2 L supplemental oxygen via nasal cannula.  Diffuse expiratory wheezes.  Speaking in shorter phrases. Abdominal:     General: Bowel sounds are normal. There is no distension.     Palpations: Abdomen is soft.     Tenderness: There is no abdominal tenderness. There is no guarding or rebound.  Musculoskeletal:     Cervical back: Neck supple.  Skin:    General: Skin is warm and dry.     Findings: No erythema.  Neurological:     Mental Status: He is alert.  Psychiatric:        Behavior: Behavior normal.     ED Results / Procedures / Treatments   Labs (all labs ordered are listed, but only abnormal results are displayed) Labs Reviewed  BASIC METABOLIC PANEL - Abnormal; Notable for the following components:      Result Value   Calcium 8.8 (*)    All other components within normal limits  CBC - Abnormal; Notable for the following components:   RDW 17.6 (*)    All other components within normal limits  SARS CORONAVIRUS 2 BY RT PCR [facility] ORDER, [place] [facility])    EKG EKG Interpretation  Date/Time:  Friday August 02 2020 14:02:29 EDT Ventricular Rate:  66 PR Interval:    QRS Duration: 84 QT Interval:  445 QTC Calculation: 467 R Axis:   83 Text Interpretation: Sinus rhythm Atrial premature complex Minor diffuse ST elevations seen on prior tracing, without reciprocal changes to suggest ACS.  Consistent with BER or  pericarditis.  No  STEMI Confirmed by Octaviano Glow [phone]) on 08/02/2020 2:16:25 PM   Radiology DG Chest Portable 1 View  Result Date: 08/02/2020 CLINICAL DATA:  COPD, difficulty breathing EXAM: PORTABLE CHEST 1 VIEW COMPARISON:  07/14/2019 FINDINGS: Background changes of COPD. No new consolidation or edema. No pleural effusion or pneumothorax. Normal heart size. Chronic right rib fractures. Anterior cervical fusion is noted. Chronic deformity of the right  humeral head. IMPRESSION: No acute process in the chest. Electronically Signed   By: Macy Mis M.D.   On: 08/02/2020 14:43    Procedures Procedures (including critical care time)  Medications Ordered in ED Medications  methylPREDNISolone sodium succinate (SOLU-MEDROL) 125 mg/2 mL injection 125 mg (125 mg Intravenous Given 08/02/20 1408)  sodium chloride 0.9 % bolus 1,000 mL (0 mLs Intravenous Stopped 08/02/20 1530)  albuterol (VENTOLIN HFA) 108 (90 Base) MCG/ACT inhaler 6 puff (6 puffs Inhalation Given 08/02/20 1416)  magnesium sulfate IVPB 1 g 100 mL (0 g Intravenous Stopped 08/02/20 1611)  ipratropium-albuterol (DUONEB) 0.5-2.5 (3) MG/3ML nebulizer solution 3 mL (3 mLs Nebulization Given 08/02/20 1532)    ED Course  I have reviewed the triage vital signs and the nursing notes.  Pertinent labs & imaging results that were available during my care of the patient were reviewed by me and considered in my medical decision making (see chart for details).  Clinical Course as of Aug 02 1724  Fri Aug 02, 2020  1433 49 yo male w/ COPD presenting to ED with cough, wheezing for several days, feels like his prior COPD exacerbations.  He was mildly hypoxic on arrival requiring 2L to maintain SPO2 of 95%.  No retractions, speaking in full sentences, he has diffuse expiratory wheezing bilaterally.  Not covid vaccinate.d  Pending covid swab, xray chest, basic labs, and nebulizer treatments, and steroids.  I STRONGLY encouraged the patient to get himself vaccinated as soon as  possible for covid   [MT]    Clinical Course User Index [MT] Trifan, Carola Rhine, MD   MDM Rules/Calculators/A&P                          Ernest Reese was evaluated in Emergency Department on 08/02/2020 for the symptoms described in the history of present illness. He was evaluated in the context of the global COVID-19 pandemic, which necessitated consideration that the patient might be at risk for infection with the SARS-CoV-2 virus that causes COVID-19. Institutional protocols and algorithms that pertain to the evaluation of patients at risk for COVID-19 are in a state of rapid change based on information released by regulatory bodies including the CDC and federal and state organizations. These policies and algorithms were followed during the patient's care in the ED.  Patient presenting for evaluation of shortness of breath, cough, wheezing for 3 days.  Found to be hypoxic to 88% on room air on arrival to the ED but improved on supplemental oxygen.  Exhibits diffuse expiratory wheezes on auscultation of the lungs.  Speaking in full sentences but appears mildly dyspneic.  No accessory muscle use noted.  Chest x-ray shows no acute cardiopulmonary abnormalities, no pneumonia or pneumothorax.  Lab work reviewed and interpreted by myself shows no leukocytosis, no anemia, no metabolic derangements or renal insufficiency.  His Covid test was negative.  His EKG shows no acute ischemic abnormalities.   In the ED he received Solu-Medrol, multiple puffs of albuterol prior to his Covid test resulting.  He subsequently underwent DuoNeb and magnesium after his Covid test resulted.  He was ambulated in the ED with SPO2 saturations as low as 88% with ambulation but did not appear markedly short of breath and denied shortness of breath to the RN ambulating him.  On reevaluation he reports that he is feeling much better and would like to go home.  He has a pulse oximeter at home per his report and  states that his  typical SPO2 saturations are between 88 to 93% on room air.  He was weaned off of the supplemental oxygen while in the ED and at rest has normal SPO2 saturations.  His wheezing has resolved entirely.  Overall he is is well-appearing and is insistent that he would like to go home.  He is requesting referral to a pulmonologist to follow-up with outpatient as well as a prescription for a nebulizer machine and breathing treatment solution.  I think both of these requests are reasonable.  Will discharge with a course of prednisone and refill of his albuterol inhaler.  Discussed ED return precautions.  Patient verbalized understanding of and agreement with plan and is stable for discharge at this time.  Patient was seen and evaluated Dr. Langston Masker who agrees with assessment and plan at this time. Final Clinical Impression(s) / ED Diagnoses Final diagnoses:  COPD exacerbation ([place])    Rx / DC Orders ED Discharge Orders         Ordered    albuterol (PROVENTIL) (5 MG/ML) 0.5% nebulizer solution  Every 6 hours PRN     Discontinue  Reprint     08/02/20 1656    For home use only DME Nebulizer machine     Discontinue  Reprint     08/02/20 1656    predniSONE (DELTASONE) 10 MG tablet  Daily with breakfast     Discontinue  Reprint     08/02/20 [place], Odis Turck A, PA-C 08/02/20 1731    Wyvonnia Dusky, MD 08/03/20 [phone]

## 2020-08-02 NOTE — ED Triage Notes (Signed)
Patient states he has COPD and has had difficulty breathing x 3 days. Patient states he has an Albuterol inhaler and has used more than what is ordered. Albuterol inhaler is empty now. Patient states he does not have any long acting inhalers for long term COPD.  Room air sats-88%. patient placed on O2 2L/min via [place]. Sats increased to  93%.

## 2020-08-02 NOTE — ED Notes (Signed)
Patient ambulatory without difficulty. Oxygen saturation remained 88%-89% on room air while ambulating. Patient denies SOB. PA made aware.

## 2020-08-02 NOTE — Discharge Instructions (Addendum)
Start taking prednisone as prescribed beginning tomorrow.  You received the first dose in the emergency department today.  Use the albuterol inhaler 1 to 2 puffs every 4-6 hours as needed for shortness of breath.  You can use the nebulizer machine.  Follow the instructions from the pharmacist.  Follow-up with pulmonology for reevaluation of your symptoms and for discussion of starting oxygen.  Call on Monday to schedule follow-up appointment.  Please continue to monitor your oxygen levels on the pulse oximeter and come to the emergency department if they drop below 90%.  Return to the emergency department if any concerning signs or symptoms develop such as chest pain, worsening shortness of breath, low oxygen saturations, loss of consciousness.

## 2020-08-02 NOTE — ED Notes (Signed)
Patient given sandwich.  

## 2020-08-02 NOTE — ED Notes (Signed)
XR at bedside

## 2020-08-02 NOTE — ED Notes (Signed)
Patient given soda.

## 2020-08-19 ENCOUNTER — Emergency Department (HOSPITAL_COMMUNITY)
Admission: EM | Admit: 2020-08-19 | Discharge: 2020-08-19 | Discharge status: Against Medical Advice | Payer: Medicare Other

## 2020-08-19 NOTE — ED Triage Notes (Signed)
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

## 2020-08-21 ENCOUNTER — Encounter (HOSPITAL_COMMUNITY): Payer: Self-pay

## 2020-08-21 ENCOUNTER — Emergency Department (HOSPITAL_COMMUNITY)
Admission: EM | Admit: 2020-08-21 | Discharge: 2020-08-21 | Disposition: A | Discharge status: Home / Self Care | Payer: Medicare Other | Attending: Emergency Medicine | Admitting: Emergency Medicine

## 2020-08-21 ENCOUNTER — Emergency Department (HOSPITAL_COMMUNITY): Payer: Medicare Other

## 2020-08-21 ENCOUNTER — Other Ambulatory Visit: Payer: Self-pay

## 2020-08-21 DIAGNOSIS — R079 Chest pain, unspecified: Secondary | ICD-10-CM | POA: Diagnosis present

## 2020-08-21 DIAGNOSIS — Z5321 Procedure and treatment not carried out due to patient leaving prior to being seen by health care provider: Secondary | ICD-10-CM | POA: Diagnosis not present

## 2020-08-21 DIAGNOSIS — R0602 Shortness of breath: Secondary | ICD-10-CM | POA: Insufficient documentation

## 2020-08-21 LAB — BASIC METABOLIC PANEL
Anion gap: 10 (ref 5–15)
BUN: 11 mg/dL (ref 6–20)
CO2: 30 mmol/L (ref 22–32)
Calcium: 8.8 mg/dL — ABNORMAL LOW (ref 8.9–10.3)
Chloride: 98 mmol/L (ref 98–111)
Creatinine, Ser: 1.14 mg/dL (ref 0.61–1.24)
GFR calc Af Amer: >60 mL/min (ref >60)
GFR calc non Af Amer: >60 mL/min (ref >60)
Glucose, Bld: 79 mg/dL (ref 70–99)
Potassium: 4.9 mmol/L (ref 3.5–5.1)
Sodium: 138 mmol/L (ref 135–145)

## 2020-08-21 LAB — CBC
HCT: 40.7 % (ref 39.0–52.0)
Hemoglobin: 12.6 g/dL — ABNORMAL LOW (ref 13.0–17.0)
MCH: 26.9 pg (ref 26.0–34.0)
MCHC: 31 g/dL (ref 30.0–36.0)
MCV: 86.8 fL (ref 80.0–100.0)
Platelets: 354 10*3/uL (ref 150–400)
RBC: 4.69 MIL/uL (ref 4.22–5.81)
RDW: 16.6 % — ABNORMAL HIGH (ref 11.5–15.5)
WBC: 8.3 10*3/uL (ref 4.0–10.5)
nRBC: 0 % (ref 0.0–0.2)

## 2020-08-21 LAB — TROPONIN I (HIGH SENSITIVITY): Troponin I (High Sensitivity): 3 ng/L (ref <18)

## 2020-08-21 MED ORDER — ACETAMINOPHEN 325 MG PO TABS
650.0000 mg | ORAL_TABLET | Freq: Once | ORAL | Status: AC
  Administered 2020-08-21: 650 mg via ORAL
  Filled 2020-08-21: qty 2

## 2020-08-21 NOTE — ED Triage Notes (Signed)
Pt reports stabbing right sided chest pain and sob fort the past 2 days, hx of emphysema. Reap e/u at this time.

## 2020-10-15 ENCOUNTER — Encounter (HOSPITAL_COMMUNITY): Payer: Self-pay | Admitting: Emergency Medicine

## 2020-10-15 ENCOUNTER — Other Ambulatory Visit: Payer: Self-pay

## 2020-10-15 ENCOUNTER — Emergency Department (HOSPITAL_COMMUNITY): Payer: Medicare Other

## 2020-10-15 ENCOUNTER — Emergency Department (HOSPITAL_COMMUNITY)
Admission: EM | Admit: 2020-10-15 | Discharge: 2020-10-15 | Disposition: A | Discharge status: Home / Self Care | Payer: Medicare Other | Attending: Emergency Medicine | Admitting: Emergency Medicine

## 2020-10-15 DIAGNOSIS — G8929 Other chronic pain: Secondary | ICD-10-CM | POA: Insufficient documentation

## 2020-10-15 DIAGNOSIS — F17210 Nicotine dependence, cigarettes, uncomplicated: Secondary | ICD-10-CM | POA: Insufficient documentation

## 2020-10-15 DIAGNOSIS — Z96659 Presence of unspecified artificial knee joint: Secondary | ICD-10-CM | POA: Diagnosis not present

## 2020-10-15 DIAGNOSIS — Z7951 Long term (current) use of inhaled steroids: Secondary | ICD-10-CM | POA: Insufficient documentation

## 2020-10-15 DIAGNOSIS — Z8547 Personal history of malignant neoplasm of testis: Secondary | ICD-10-CM | POA: Insufficient documentation

## 2020-10-15 DIAGNOSIS — J441 Chronic obstructive pulmonary disease with (acute) exacerbation: Secondary | ICD-10-CM | POA: Insufficient documentation

## 2020-10-15 DIAGNOSIS — R52 Pain, unspecified: Secondary | ICD-10-CM

## 2020-10-15 DIAGNOSIS — M25511 Pain in right shoulder: Secondary | ICD-10-CM | POA: Diagnosis present

## 2020-10-15 LAB — CBC WITH DIFFERENTIAL/PLATELET
Abs Immature Granulocytes: 0.08 10*3/uL — ABNORMAL HIGH (ref 0.00–0.07)
Basophils Absolute: 0 10*3/uL (ref 0.0–0.1)
Basophils Relative: 0 %
Eosinophils Absolute: 0 10*3/uL (ref 0.0–0.5)
Eosinophils Relative: 0 %
HCT: 44.9 % (ref 39.0–52.0)
Hemoglobin: 13.6 g/dL (ref 13.0–17.0)
Immature Granulocytes: 1 %
Lymphocytes Relative: 15 %
Lymphs Abs: 2.2 10*3/uL (ref 0.7–4.0)
MCH: 27.1 pg (ref 26.0–34.0)
MCHC: 30.3 g/dL (ref 30.0–36.0)
MCV: 89.4 fL (ref 80.0–100.0)
Monocytes Absolute: 2 10*3/uL — ABNORMAL HIGH (ref 0.1–1.0)
Monocytes Relative: 14 %
Neutro Abs: 10.2 10*3/uL — ABNORMAL HIGH (ref 1.7–7.7)
Neutrophils Relative %: 70 %
Platelets: 215 10*3/uL (ref 150–400)
RBC: 5.02 MIL/uL (ref 4.22–5.81)
RDW: 16.1 % — ABNORMAL HIGH (ref 11.5–15.5)
WBC: 14.6 10*3/uL — ABNORMAL HIGH (ref 4.0–10.5)
nRBC: 0 % (ref 0.0–0.2)

## 2020-10-15 LAB — BASIC METABOLIC PANEL
Anion gap: 9 (ref 5–15)
BUN: 13 mg/dL (ref 6–20)
CO2: 28 mmol/L (ref 22–32)
Calcium: 9.2 mg/dL (ref 8.9–10.3)
Chloride: 98 mmol/L (ref 98–111)
Creatinine, Ser: 1.21 mg/dL (ref 0.61–1.24)
GFR, Estimated: >60 mL/min (ref >60)
Glucose, Bld: 130 mg/dL — ABNORMAL HIGH (ref 70–99)
Potassium: 4 mmol/L (ref 3.5–5.1)
Sodium: 135 mmol/L (ref 135–145)

## 2020-10-15 MED ORDER — DOXYCYCLINE HYCLATE 100 MG PO CAPS: 100.0000 mg | ORAL_CAPSULE | Freq: Two times a day (BID) | ORAL | 0 refills | Status: AC

## 2020-10-15 MED ORDER — PREDNISONE 10 MG PO TABS: 40.0000 mg | ORAL_TABLET | Freq: Every day | ORAL | 0 refills | Status: AC

## 2020-10-15 MED ORDER — ALBUTEROL SULFATE HFA 108 (90 BASE) MCG/ACT IN AERS
2.0000 | INHALATION_SPRAY | Freq: Once | RESPIRATORY_TRACT | Status: AC
  Administered 2020-10-15: 2 via RESPIRATORY_TRACT
  Filled 2020-10-15: qty 6.7

## 2020-10-15 MED ORDER — AEROCHAMBER PLUS FLO-VU LARGE MISC
Status: AC
  Filled 2020-10-15: qty 1

## 2020-10-15 MED ORDER — AEROCHAMBER PLUS FLO-VU LARGE MISC
1.0000 | Freq: Once | Status: AC
  Administered 2020-10-15: 1

## 2020-10-15 NOTE — Discharge Instructions (Addendum)
At this time there does not appear to be the presence of an emergent medical condition, however there is always the potential for conditions to change. Please read and follow the below instructions.  Please return to the Emergency Department immediately for any new or worsening symptoms. Please be sure to follow up with your Primary Care Provider within one week regarding your visit today; please call their office to schedule an appointment even if you are feeling better for a follow-up visit. You may call the orthopedic specialist Dr. Stann Mainland under discharge paperwork for follow-up care regarding your right shoulder pain.  Please use the sling to protect your shoulder from injury, call Dr. Stann Mainland office today to schedule an appointment. As we discussed your oxygen level was somewhat low in the emergency department, it is important for you to take all of your daily medications for COPD as prescribed by your primary care provider.  Please use the albuterol inhaler and spacer as needed if you develop shortness of breath, use it at least every 2 hours for the next few days.  If the albuterol inhaler is not helping with your shortness of breath call 911 and return immediately to the emergency department. Please take the steroid medication prednisone as prescribed to help with COPD exacerbation.  Please also take the antibiotic Doxycycline as prescribed to help with your COPD exacerbation.  Go to the nearest Emergency Department immediately if: You have fever or chills Your arm, hand, or fingers: Tingle. Are numb. Are swollen. Are painful. Turn white or blue. You have shortness of breath while resting. You have shortness of breath that stops you from: Being able to talk. Doing normal activities. Your chest hurts for longer than 5 minutes. Your skin color is more blue than usual. Your pulse oximeter shows that you have low oxygen for longer than 5 minutes. You have a fever. You feel too tired to  breathe normally. You have any new/concerning or worsening of symptoms.   Please read the additional information packets attached to your discharge summary.  Do not take your medicine if  develop an itchy rash, swelling in your mouth or lips, or difficulty breathing; call 911 and seek immediate emergency medical attention if this occurs.  You may review your lab tests and imaging results in their entirety on your MyChart account.  Please discuss all results of fully with your primary care provider and other specialist at your follow-up visit.  Note: Portions of this text may have been transcribed using voice recognition software. Every effort was made to ensure accuracy; however, inadvertent computerized transcription errors may still be present.

## 2020-10-15 NOTE — ED Notes (Signed)
Reviewed discharge instructions with patient. Follow-up care and medications reviewed. Patient  verbalized understanding. Patient A&Ox4, VSS, and ambulatory with steady gait upon discharge.  °

## 2020-10-15 NOTE — ED Notes (Signed)
Pt refusing EKG and covid test
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
## 2020-10-15 NOTE — Progress Notes (Signed)
Orthopedic Tech Progress Note Patient Details:  Ernest Reese 08-24-1971 [id number]  Ortho Devices Type of Ortho Device: Arm sling Ortho Device/Splint Location: RUE Ortho Device/Splint Interventions: Ordered, Application   Post Interventions Patient Tolerated: Well Instructions Provided: Care of device   Janit Pagan 10/15/2020, 2:29 PM

## 2020-10-15 NOTE — ED Notes (Signed)
Transported to X-ray

## 2020-10-15 NOTE — ED Triage Notes (Signed)
Patient fell and injured his right shoulder this morning .

## 2020-10-15 NOTE — ED Notes (Signed)
Pt with hypoxia upon recheck with VS. Has h/o COPD per patient. O2 sat 85% and no change when asked to take deep breaths. Placed on 1L [place] with sat increase to 89. Up to [facility] with sat to 91. T: 99.4. C/O contiued pain. Appears lethargic but arousable. Spoke with Sort RN to request upgrade in priority of ED bed.

## 2020-11-26 ENCOUNTER — Other Ambulatory Visit: Payer: Self-pay | Admitting: Orthopedic Surgery

## 2020-11-26 DIAGNOSIS — M25511 Pain in right shoulder: Secondary | ICD-10-CM

## 2020-11-26 DIAGNOSIS — G8929 Other chronic pain: Secondary | ICD-10-CM

## 2020-12-13 ENCOUNTER — Ambulatory Visit
Admission: RE | Admit: 2020-12-13 | Discharge: 2020-12-13 | Disposition: A | Discharge status: Home / Self Care | Payer: Medicare Other | Source: Ambulatory Visit | Attending: Orthopedic Surgery | Admitting: Orthopedic Surgery

## 2020-12-13 ENCOUNTER — Other Ambulatory Visit: Payer: Self-pay

## 2020-12-13 ENCOUNTER — Other Ambulatory Visit: Payer: Medicare Other

## 2020-12-13 DIAGNOSIS — M25511 Pain in right shoulder: Secondary | ICD-10-CM

## 2020-12-18 LAB — CELL COUNT AND DIFF, FLUID, OTHER
Basophils, %: 0 %
Eosinophils, %: 3 %
Lymphocytes, %: 35 %
Mesothelial, %: 0 %
Monocyte/Macrophage %: 0 %
Neutrophils, %: 62 %
Total Nucleated Cell Ct: 4516 cells/uL

## 2020-12-18 LAB — ANAEROBIC AND AEROBIC CULTURE
AER RESULT:: NO GROWTH
MICRO NUMBER:: 11331704
MICRO NUMBER:: 11331705
SPECIMEN QUALITY:: ADEQUATE
SPECIMEN QUALITY:: ADEQUATE

## 2020-12-18 LAB — SYNOVIAL FLUID, CRYSTAL

## 2021-01-03 DIAGNOSIS — M25512 Pain in left shoulder: Secondary | ICD-10-CM | POA: Insufficient documentation

## 2021-02-07 ENCOUNTER — Other Ambulatory Visit: Payer: Self-pay

## 2021-02-07 ENCOUNTER — Encounter (HOSPITAL_COMMUNITY): Payer: Self-pay

## 2021-02-07 ENCOUNTER — Encounter (HOSPITAL_COMMUNITY)
Admission: RE | Admit: 2021-02-07 | Discharge: 2021-02-07 | Disposition: A | Discharge status: Home / Self Care | Payer: Medicare HMO | Source: Ambulatory Visit | Attending: Orthopedic Surgery | Admitting: Orthopedic Surgery

## 2021-02-07 ENCOUNTER — Other Ambulatory Visit (HOSPITAL_COMMUNITY)
Admission: RE | Admit: 2021-02-07 | Discharge: 2021-02-07 | Disposition: A | Discharge status: Home / Self Care | Payer: Medicare HMO | Source: Ambulatory Visit | Attending: Orthopedic Surgery | Admitting: Orthopedic Surgery

## 2021-02-07 DIAGNOSIS — Z20822 Contact with and (suspected) exposure to covid-19: Secondary | ICD-10-CM | POA: Insufficient documentation

## 2021-02-07 DIAGNOSIS — Z01812 Encounter for preprocedural laboratory examination: Secondary | ICD-10-CM | POA: Insufficient documentation

## 2021-02-07 LAB — COMPREHENSIVE METABOLIC PANEL
ALT: 21 U/L (ref 0–44)
AST: 22 U/L (ref 15–41)
Albumin: 3.5 g/dL (ref 3.5–5.0)
Alkaline Phosphatase: 82 U/L (ref 38–126)
Anion gap: 11 (ref 5–15)
BUN: 13 mg/dL (ref 6–20)
CO2: 30 mmol/L (ref 22–32)
Calcium: 9.4 mg/dL (ref 8.9–10.3)
Chloride: 100 mmol/L (ref 98–111)
Creatinine, Ser: 1.51 mg/dL — ABNORMAL HIGH (ref 0.61–1.24)
GFR, Estimated: 57 mL/min — ABNORMAL LOW (ref >60)
Glucose, Bld: 84 mg/dL (ref 70–99)
Potassium: 5.3 mmol/L — ABNORMAL HIGH (ref 3.5–5.1)
Sodium: 141 mmol/L (ref 135–145)
Total Bilirubin: 0.5 mg/dL (ref 0.3–1.2)
Total Protein: 7.3 g/dL (ref 6.5–8.1)

## 2021-02-07 LAB — SURGICAL PCR SCREEN
MRSA, PCR: POSITIVE — AB
Staphylococcus aureus: POSITIVE — AB

## 2021-02-07 LAB — CBC
HCT: 47.7 % (ref 39.0–52.0)
Hemoglobin: 14.8 g/dL (ref 13.0–17.0)
MCH: 27.9 pg (ref 26.0–34.0)
MCHC: 31 g/dL (ref 30.0–36.0)
MCV: 89.8 fL (ref 80.0–100.0)
Platelets: 356 10*3/uL (ref 150–400)
RBC: 5.31 MIL/uL (ref 4.22–5.81)
RDW: 17.1 % — ABNORMAL HIGH (ref 11.5–15.5)
WBC: 8.4 10*3/uL (ref 4.0–10.5)
nRBC: 0 % (ref 0.0–0.2)

## 2021-02-07 LAB — SARS CORONAVIRUS 2 (TAT 6-24 HRS): SARS Coronavirus 2: NEGATIVE

## 2021-02-07 NOTE — Progress Notes (Addendum)
Surgical Instructions    Your procedure is scheduled on Tuesday, February 15th.  Report to [facility] Main Entrance "A" at 10:30 A.M., then check in with the Admitting office.  Call this number if you have problems the morning of surgery:  [phone]   If you have any questions prior to your surgery date call [phone]: Open Monday-Friday 8am-4pm    Remember:  Do not eat after midnight the night before your surgery  You may drink clear liquids until 9:30 AM the morning of your surgery.   Clear liquids allowed are: Water, Non-Citrus Juices (without pulp), Carbonated Beverages, Clear Tea, Black Coffee Only, and Gatorade  Finish your pre-surgery Ensure drink by 9:30 AM, the morning of surgery.  Do not sip. Drink all in one sitting.  Nothing else to drink after you finish the Ensure.    Take these medicines the morning of surgery with A SIP OF WATER   Albuterol inhaler - if needed (bring with you on day of surgery)  Albuterol nebulizer - if needed  Gabapentin (Neurontin) - if needed  Methadone     As of today, STOP taking any Aspirin (unless otherwise instructed by your surgeon) Aleve, Naproxen, Ibuprofen, Motrin, Advil, Goody's, BC's, all herbal medications, fish oil, and all vitamins.             DAY OF SURGERY:            Do not wear jewelry            Do not wear lotions, powders, perfumes/colognes, or deodorant.            Men may shave face and neck.            Do not bring valuables to the hospital.            [facility] is not responsible for any belongings or valuables.                                           [facility]- Preparing for Total Shoulder Arthroplasty   Before surgery, you can play an important role. Because skin is not sterile, your skin needs to be as free of germs as possible. You can reduce the number of germs on your skin by using the following products. . Benzoyl Peroxide Gel o Reduces the number of germs present on the skin o Applied twice a day  to shoulder area starting two days before surgery   . Chlorhexidine Gluconate (CHG) Soap o An antiseptic cleaner that kills germs and bonds with the skin to continue killing germs even after washing o Used for showering the night before surgery and morning of surgery   Oral Hygiene is also important to reduce your risk of infection.                                    Remember - BRUSH YOUR TEETH THE MORNING OF SURGERY WITH YOUR REGULAR TOOTHPASTE  ==================================================================  Please follow these instructions carefully:  BENZOYL PEROXIDE 5% GEL  Please do not use if you have an allergy to benzoyl peroxide.   If your skin becomes reddened/irritated stop using the benzoyl peroxide.  Starting two days before surgery, apply as follows: 1. Apply benzoyl peroxide in the morning and at night. Apply after taking  a shower. If you are not taking a shower clean entire shoulder front, back, and side along with the armpit with a clean wet washcloth.  2. Place a quarter-sized dollop on your shoulder and rub in thoroughly, making sure to cover the front, back, and side of your shoulder, along with the armpit.   2 days before ____ AM   ____ PM              1 day before ____ AM   ____ PM                         3.  Do this twice a day for two days.  (Last application is the night before surgery, AFTER using the CHG soap as described below).  4. Do NOT apply benzoyl peroxide gel on the day of surgery.  CHLORHEXIDINE GLUCONATE (CHG) SOAP  Please do not use if you have an allergy to CHG or antibacterial soaps. If your skin becomes reddened/irritated stop using the CHG.   Do not shave (including legs and underarms) for at least 48 hours prior to first CHG shower. It is OK to shave your face.  Starting the night before surgery, use CHG soap as follows:  1. Shower the NIGHT BEFORE SURGERY and MORNING OF SURGERY with CHG.  2. If you choose to wash your hair, wash  your hair first as usual with your normal shampoo.  3. After shampooing, rinse your hair and body thoroughly to remove the shampoo.  4. Use CHG as you would any other liquid soap.  You can apply CHG directly to the skin and wash gently with a scrungie or a clean washcloth.  5. Apply the CHG soap to your body ONLY FROM THE NECK DOWN.  Do not use on open wounds or open sores.  Avoid contact with your eyes, ears, mouth, and genitals (private parts).  Wash face and genitals (private parts) with your normal soap.  6. Wash thoroughly, paying special attention to the area where your surgery will be performed.  7. Thoroughly rinse your body with warm water from the neck down.  8. DO NOT shower/wash with your normal soap after using and rinsing off the CHG soap.   9. Pat yourself dry with a CLEAN TOWEL.   10.  Apply benzoyl peroxide.   11. Wear CLEAN PAJAMAS to bed the night before surgery; wear comfortable clothes the morning of surgery.  12. Place CLEAN SHEETS on your bed the night of your first shower and DO NOT SLEEP WITH PETS.  Day of Surgery: Shower as above Do not apply any deodorants/lotions.  Please wear clean clothes to the hospital/surgery center.   Remember to brush your teeth WITH YOUR REGULAR TOOTHPASTE.

## 2021-02-07 NOTE — Progress Notes (Signed)
Left a voice message for [name] from Dr. Stann Mainland office with + MRSA/Staph pcr results.

## 2021-02-07 NOTE — Progress Notes (Signed)
PCP - [facility]) Cardiologist - denies  Chest x-ray - n/a EKG - n/a  ERAS Protcol - yes, Ensure given  COVID TEST- 2/11   Anesthesia review: n/a  Patient denies shortness of breath, fever, cough and chest pain at PAT appointment   All instructions explained to the patient, with a verbal understanding of the material. Patient agrees to go over the instructions while at home for a better understanding. Patient also instructed to self quarantine after being tested for COVID-19. The opportunity to ask questions was provided.

## 2021-02-11 ENCOUNTER — Ambulatory Visit (HOSPITAL_COMMUNITY): Payer: Medicare HMO | Admitting: Physician Assistant

## 2021-02-11 ENCOUNTER — Ambulatory Visit (HOSPITAL_COMMUNITY)
Admission: RE | Admit: 2021-02-11 | Discharge: 2021-02-11 | Disposition: A | Discharge status: Home / Self Care | Payer: Medicare HMO | Attending: Orthopedic Surgery | Admitting: Orthopedic Surgery

## 2021-02-11 ENCOUNTER — Other Ambulatory Visit: Payer: Self-pay

## 2021-02-11 ENCOUNTER — Encounter (HOSPITAL_COMMUNITY)
Admission: RE | Disposition: A | Discharge status: Home / Self Care | Payer: Self-pay | Source: Home / Self Care | Attending: Orthopedic Surgery

## 2021-02-11 ENCOUNTER — Ambulatory Visit (HOSPITAL_COMMUNITY): Payer: Medicare HMO

## 2021-02-11 ENCOUNTER — Encounter (HOSPITAL_COMMUNITY): Payer: Self-pay | Admitting: Orthopedic Surgery

## 2021-02-11 ENCOUNTER — Ambulatory Visit (HOSPITAL_COMMUNITY): Payer: Medicare HMO | Admitting: Anesthesiology

## 2021-02-11 DIAGNOSIS — M19011 Primary osteoarthritis, right shoulder: Secondary | ICD-10-CM | POA: Insufficient documentation

## 2021-02-11 DIAGNOSIS — Z79891 Long term (current) use of opiate analgesic: Secondary | ICD-10-CM | POA: Insufficient documentation

## 2021-02-11 DIAGNOSIS — Z96611 Presence of right artificial shoulder joint: Secondary | ICD-10-CM

## 2021-02-11 DIAGNOSIS — F17210 Nicotine dependence, cigarettes, uncomplicated: Secondary | ICD-10-CM | POA: Diagnosis not present

## 2021-02-11 DIAGNOSIS — Z96659 Presence of unspecified artificial knee joint: Secondary | ICD-10-CM | POA: Diagnosis not present

## 2021-02-11 DIAGNOSIS — Z8547 Personal history of malignant neoplasm of testis: Secondary | ICD-10-CM | POA: Diagnosis not present

## 2021-02-11 DIAGNOSIS — Z809 Family history of malignant neoplasm, unspecified: Secondary | ICD-10-CM | POA: Insufficient documentation

## 2021-02-11 DIAGNOSIS — Z79899 Other long term (current) drug therapy: Secondary | ICD-10-CM | POA: Insufficient documentation

## 2021-02-11 DIAGNOSIS — M67211 Synovial hypertrophy, not elsewhere classified, right shoulder: Secondary | ICD-10-CM | POA: Diagnosis not present

## 2021-02-11 DIAGNOSIS — M25711 Osteophyte, right shoulder: Secondary | ICD-10-CM | POA: Insufficient documentation

## 2021-02-11 HISTORY — PX: TOTAL SHOULDER ARTHROPLASTY: SHX126

## 2021-02-11 SURGERY — ARTHROPLASTY, SHOULDER, TOTAL
Anesthesia: General | Site: Shoulder | Laterality: Right

## 2021-02-11 MED ORDER — METHOCARBAMOL 500 MG PO TABS: 500.0000 mg | ORAL_TABLET | Freq: Four times a day (QID) | ORAL | 0 refills | Status: DC | PRN

## 2021-02-11 MED ORDER — IBUPROFEN 800 MG PO TABS: 800.0000 mg | ORAL_TABLET | Freq: Three times a day (TID) | ORAL | 0 refills | Status: DC | PRN

## 2021-02-11 MED ORDER — DEXAMETHASONE SODIUM PHOSPHATE 10 MG/ML IJ SOLN
INTRAMUSCULAR | Status: DC | PRN
  Administered 2021-02-11: 10 mg via INTRAVENOUS

## 2021-02-11 MED ORDER — ROCURONIUM BROMIDE 10 MG/ML (PF) SYRINGE
PREFILLED_SYRINGE | INTRAVENOUS | Status: DC | PRN
  Administered 2021-02-11: 50 mg via INTRAVENOUS
  Administered 2021-02-11: 30 mg via INTRAVENOUS

## 2021-02-11 MED ORDER — BUPIVACAINE-EPINEPHRINE (PF) 0.5% -1:200000 IJ SOLN
INTRAMUSCULAR | Status: DC | PRN
  Administered 2021-02-11: 15 mL via PERINEURAL

## 2021-02-11 MED ORDER — AMISULPRIDE (ANTIEMETIC) 5 MG/2ML IV SOLN: 10.0000 mg | Freq: Once | INTRAVENOUS | Status: DC | PRN

## 2021-02-11 MED ORDER — LACTATED RINGERS IV SOLN: INTRAVENOUS | Status: DC

## 2021-02-11 MED ORDER — LIDOCAINE 2% (20 MG/ML) 5 ML SYRINGE
INTRAMUSCULAR | Status: DC | PRN
  Administered 2021-02-11: 50 mg via INTRAVENOUS

## 2021-02-11 MED ORDER — FENTANYL CITRATE (PF) 250 MCG/5ML IJ SOLN
INTRAMUSCULAR | Status: DC | PRN
  Administered 2021-02-11: 50 ug via INTRAVENOUS

## 2021-02-11 MED ORDER — ORAL CARE MOUTH RINSE: 15.0000 mL | Freq: Once | OROMUCOSAL | Status: AC

## 2021-02-11 MED ORDER — VANCOMYCIN HCL 1000 MG IV SOLR
INTRAVENOUS | Status: AC
  Filled 2021-02-11: qty 1000

## 2021-02-11 MED ORDER — CELECOXIB 200 MG PO CAPS
200.0000 mg | ORAL_CAPSULE | Freq: Once | ORAL | Status: AC
  Administered 2021-02-11: 200 mg via ORAL
  Filled 2021-02-11: qty 1

## 2021-02-11 MED ORDER — IPRATROPIUM-ALBUTEROL 0.5-2.5 (3) MG/3ML IN SOLN
3.0000 mL | RESPIRATORY_TRACT | Status: DC
  Administered 2021-02-11: 3 mL via RESPIRATORY_TRACT

## 2021-02-11 MED ORDER — VANCOMYCIN HCL 1000 MG IV SOLR
INTRAVENOUS | Status: DC | PRN
  Administered 2021-02-11: 1000 mg via INTRAVENOUS

## 2021-02-11 MED ORDER — ACETAMINOPHEN 500 MG PO TABS
1000.0000 mg | ORAL_TABLET | Freq: Once | ORAL | Status: AC
  Administered 2021-02-11: 1000 mg via ORAL
  Filled 2021-02-11: qty 2

## 2021-02-11 MED ORDER — PHENYLEPHRINE HCL (PRESSORS) 10 MG/ML IV SOLN
INTRAVENOUS | Status: AC
  Filled 2021-02-11: qty 1

## 2021-02-11 MED ORDER — ONDANSETRON 4 MG PO TBDP: 4.0000 mg | ORAL_TABLET | Freq: Three times a day (TID) | ORAL | 0 refills | Status: DC | PRN

## 2021-02-11 MED ORDER — PHENYLEPHRINE HCL-NACL 10-0.9 MG/250ML-% IV SOLN
INTRAVENOUS | Status: DC | PRN
  Administered 2021-02-11: 35 ug/min via INTRAVENOUS

## 2021-02-11 MED ORDER — FENTANYL CITRATE (PF) 250 MCG/5ML IJ SOLN
INTRAMUSCULAR | Status: AC
  Filled 2021-02-11: qty 5

## 2021-02-11 MED ORDER — SUGAMMADEX SODIUM 200 MG/2ML IV SOLN
INTRAVENOUS | Status: DC | PRN
  Administered 2021-02-11: 200 mg via INTRAVENOUS

## 2021-02-11 MED ORDER — ALBUTEROL SULFATE HFA 108 (90 BASE) MCG/ACT IN AERS
INHALATION_SPRAY | RESPIRATORY_TRACT | Status: DC | PRN
  Administered 2021-02-11: 3 via RESPIRATORY_TRACT

## 2021-02-11 MED ORDER — IPRATROPIUM-ALBUTEROL 0.5-2.5 (3) MG/3ML IN SOLN
RESPIRATORY_TRACT | Status: AC
  Administered 2021-02-11: 3 mL via RESPIRATORY_TRACT
  Filled 2021-02-11: qty 3

## 2021-02-11 MED ORDER — FENTANYL CITRATE (PF) 100 MCG/2ML IJ SOLN
INTRAMUSCULAR | Status: AC
  Filled 2021-02-11: qty 2

## 2021-02-11 MED ORDER — IPRATROPIUM-ALBUTEROL 0.5-2.5 (3) MG/3ML IN SOLN
3.0000 mL | Freq: Once | RESPIRATORY_TRACT | Status: AC
  Filled 2021-02-11: qty 3

## 2021-02-11 MED ORDER — VANCOMYCIN HCL IN DEXTROSE 1-5 GM/200ML-% IV SOLN
1000.0000 mg | INTRAVENOUS | Status: AC
  Administered 2021-02-11: 1000 mg via INTRAVENOUS
  Filled 2021-02-11: qty 200

## 2021-02-11 MED ORDER — PROPOFOL 10 MG/ML IV BOLUS
INTRAVENOUS | Status: AC
  Filled 2021-02-11: qty 20

## 2021-02-11 MED ORDER — MIDAZOLAM HCL 2 MG/2ML IJ SOLN: 1.0000 mg | Freq: Once | INTRAMUSCULAR | Status: AC

## 2021-02-11 MED ORDER — PHENYLEPHRINE 40 MCG/ML (10ML) SYRINGE FOR IV PUSH (FOR BLOOD PRESSURE SUPPORT)
PREFILLED_SYRINGE | INTRAVENOUS | Status: DC | PRN
  Administered 2021-02-11: 80 ug via INTRAVENOUS

## 2021-02-11 MED ORDER — CEFAZOLIN SODIUM-DEXTROSE 2-4 GM/100ML-% IV SOLN: 2.0000 g | INTRAVENOUS | Status: DC

## 2021-02-11 MED ORDER — BUPIVACAINE LIPOSOME 1.3 % IJ SUSP
INTRAMUSCULAR | Status: DC | PRN
  Administered 2021-02-11: 10 mL via PERINEURAL

## 2021-02-11 MED ORDER — FENTANYL CITRATE (PF) 100 MCG/2ML IJ SOLN: 25.0000 ug | INTRAMUSCULAR | Status: DC | PRN

## 2021-02-11 MED ORDER — 0.9 % SODIUM CHLORIDE (POUR BTL) OPTIME
TOPICAL | Status: DC | PRN
  Administered 2021-02-11: 1000 mL

## 2021-02-11 MED ORDER — PROPOFOL 10 MG/ML IV BOLUS
INTRAVENOUS | Status: DC | PRN
  Administered 2021-02-11: 100 mg via INTRAVENOUS

## 2021-02-11 MED ORDER — TRANEXAMIC ACID-NACL 1000-0.7 MG/100ML-% IV SOLN
1000.0000 mg | INTRAVENOUS | Status: AC
  Administered 2021-02-11: 1000 mg via INTRAVENOUS
  Filled 2021-02-11: qty 100

## 2021-02-11 MED ORDER — MIDAZOLAM HCL 2 MG/2ML IJ SOLN
INTRAMUSCULAR | Status: AC
  Administered 2021-02-11: 1 mg via INTRAVENOUS
  Filled 2021-02-11: qty 2

## 2021-02-11 MED ORDER — CHLORHEXIDINE GLUCONATE 0.12 % MT SOLN
15.0000 mL | Freq: Once | OROMUCOSAL | Status: AC
  Administered 2021-02-11: 15 mL via OROMUCOSAL
  Filled 2021-02-11: qty 15

## 2021-02-11 MED ORDER — ONDANSETRON HCL 4 MG/2ML IJ SOLN
INTRAMUSCULAR | Status: DC | PRN
  Administered 2021-02-11: 4 mg via INTRAVENOUS

## 2021-02-11 MED ORDER — VANCOMYCIN HCL 1000 MG IV SOLR
INTRAVENOUS | Status: DC | PRN
  Administered 2021-02-11: 1000 mg

## 2021-02-11 SURGICAL SUPPLY — 57 items
ADPR HD STD TPR HUM TI RVRS (Orthopedic Implant) ×1 IMPLANT
AID PSTN UNV HD RSTRNT DISP (MISCELLANEOUS) ×1
ALCOHOL 70% 16 OZ (MISCELLANEOUS) ×2 IMPLANT
BLADE SAG 18X100X1.27 (BLADE) ×2 IMPLANT
CLSR STERI-STRIP ANTIMIC 1/2X4 (GAUZE/BANDAGES/DRESSINGS) ×1 IMPLANT
COVER SURGICAL LIGHT HANDLE (MISCELLANEOUS) ×2 IMPLANT
COVER WAND RF STERILE (DRAPES) ×2 IMPLANT
DRAPE INCISE IOBAN 66X45 STRL (DRAPES) ×2 IMPLANT
DRAPE ORTHO SPLIT 77X108 STRL (DRAPES) ×4
DRAPE SURG ORHT 6 SPLT 77X108 (DRAPES) ×2 IMPLANT
DRAPE U-SHAPE 47X51 STRL (DRAPES) ×2 IMPLANT
DRESSING AQUACEL AG SP 3.5X10 (GAUZE/BANDAGES/DRESSINGS) IMPLANT
DRSG ADAPTIC 3X8 NADH LF (GAUZE/BANDAGES/DRESSINGS) ×2 IMPLANT
DRSG AQUACEL AG SP 3.5X10 (GAUZE/BANDAGES/DRESSINGS) ×2
DRSG PAD ABDOMINAL 8X10 ST (GAUZE/BANDAGES/DRESSINGS) ×2 IMPLANT
DURAPREP 26ML APPLICATOR (WOUND CARE) ×2 IMPLANT
ELECT BLADE 4.0 EZ CLEAN MEGAD (MISCELLANEOUS) ×2
ELECT REM PT RETURN 9FT ADLT (ELECTROSURGICAL) ×2
ELECTRODE BLDE 4.0 EZ CLN MEGD (MISCELLANEOUS) ×1 IMPLANT
ELECTRODE REM PT RTRN 9FT ADLT (ELECTROSURGICAL) ×1 IMPLANT
GAUZE SPONGE 4X4 12PLY STRL (GAUZE/BANDAGES/DRESSINGS) ×2 IMPLANT
GLOVE BIO SURGEON STRL SZ7.5 (GLOVE) ×4 IMPLANT
GLOVE SRG 8 PF TXTR STRL LF DI (GLOVE) ×2 IMPLANT
GLOVE SURG UNDER POLY LF SZ8 (GLOVE) ×4
GOWN STRL REUS W/ TWL LRG LVL3 (GOWN DISPOSABLE) ×1 IMPLANT
GOWN STRL REUS W/ TWL XL LVL3 (GOWN DISPOSABLE) ×2 IMPLANT
GOWN STRL REUS W/TWL LRG LVL3 (GOWN DISPOSABLE) ×2
GOWN STRL REUS W/TWL XL LVL3 (GOWN DISPOSABLE) ×4
HEAD HUMERAL BIPOLAR 38X19X39 (Miscellaneous) IMPLANT
HEAD HUMERAL COMP STD (Orthopedic Implant) IMPLANT
HUMERAL HEAD BIPOLAR 38X19X39 (Miscellaneous) ×2 IMPLANT
HUMERAL HEAD COMP STD (Orthopedic Implant) ×2 IMPLANT
KIT BASIN OR (CUSTOM PROCEDURE TRAY) ×2 IMPLANT
KIT TURNOVER KIT B (KITS) ×2 IMPLANT
MANIFOLD NEPTUNE II (INSTRUMENTS) ×2 IMPLANT
NDL TAPERED W/ NITINOL LOOP (MISCELLANEOUS) ×1 IMPLANT
NEEDLE TAPERED W/ NITINOL LOOP (MISCELLANEOUS) ×2 IMPLANT
NS IRRIG 1000ML POUR BTL (IV SOLUTION) ×2 IMPLANT
PACK SHOULDER (CUSTOM PROCEDURE TRAY) ×2 IMPLANT
PAD ARMBOARD 7.5X6 YLW CONV (MISCELLANEOUS) ×4 IMPLANT
RESTRAINT HEAD UNIVERSAL NS (MISCELLANEOUS) ×2 IMPLANT
SLING ARM FOAM STRAP LRG (SOFTGOODS) ×1 IMPLANT
SPONGE LAP 18X18 RF (DISPOSABLE) ×2 IMPLANT
STEM HUMERAL STRL 8MMX83MM (Stem) ×1 IMPLANT
STRIP CLOSURE SKIN 1/2X4 (GAUZE/BANDAGES/DRESSINGS) ×2 IMPLANT
SUCTION FRAZIER HANDLE 10FR (MISCELLANEOUS) ×2
SUCTION TUBE FRAZIER 10FR DISP (MISCELLANEOUS) ×1 IMPLANT
SUT BROADBAND TAPE 2PK 1.5 (SUTURE) ×2 IMPLANT
SUT FIBERWIRE #2 38 T-5 BLUE (SUTURE) ×4
SUT MNCRL AB 4-0 PS2 18 (SUTURE) ×2 IMPLANT
SUT VIC AB 1 CT1 36 (SUTURE) ×1 IMPLANT
SUT VIC AB 2-0 CT1 27 (SUTURE) ×2
SUT VIC AB 2-0 CT1 TAPERPNT 27 (SUTURE) ×1 IMPLANT
SUTURE FIBERWR #2 38 T-5 BLUE (SUTURE) ×2 IMPLANT
TOWEL GREEN STERILE (TOWEL DISPOSABLE) ×2 IMPLANT
WATER STERILE IRR 1000ML POUR (IV SOLUTION) ×2 IMPLANT
YANKAUER SUCT BULB TIP NO VENT (SUCTIONS) ×2 IMPLANT

## 2021-02-11 NOTE — H&P (Signed)
ORTHOPAEDIC H and P  REQUESTING PHYSICIAN: Nicholes Stairs, MD  PCP:  Patient, No Pcp Per  Chief Complaint: Right shoulder pain  HPI: Ernest Reese is a 50 y.o. male who complains of chronic right shoulder pain and dysfunction.  Here today for arthroplasty.  No new complaints.  Past Medical History:  Diagnosis Date  . Emphysema lung ([place])   . Testicular cancer [facility])    Past Surgical History:  Procedure Laterality Date  . HAND SURGERY    . NECK SURGERY    . REPLACEMENT TOTAL KNEE    . testicle removed     Social History   Socioeconomic History  . Marital status: Single    Spouse name: Not on file  . Number of children: Not on file  . Years of education: Not on file  . Highest education level: Not on file  Occupational History  . Not on file  Tobacco Use  . Smoking status: Current Every Day Smoker    Packs/day: 1.00    Types: Cigarettes  . Smokeless tobacco: Never Used  Vaping Use  . Vaping Use: Never used  Substance and Sexual Activity  . Alcohol use: Not Currently  . Drug use: Not Currently    Comment: History of drug use, takes Methadone daily  . Sexual activity: Not on file  Other Topics Concern  . Not on file  Social History Narrative  . Not on file   Social Determinants of Health   Financial Resource Strain: Not on file  Food Insecurity: Not on file  Transportation Needs: Not on file  Physical Activity: Not on file  Stress: Not on file  Social Connections: Not on file   Family History  Problem Relation Age of Onset  . Cancer Father    No Known Allergies Prior to Admission medications   Medication Sig Start Date End Date Taking? Authorizing Provider  albuterol (PROVENTIL HFA;VENTOLIN HFA) 108 (90 Base) MCG/ACT inhaler Inhale into the lungs every 6 (six) hours as needed for wheezing or shortness of breath.   Yes [provider]  albuterol (PROVENTIL) (2.5 MG/3ML) 0.083% nebulizer solution Take 2.5 mg by nebulization every 6 (six)  hours as needed for shortness of breath. 01/30/21  Yes [provider]  albuterol (VENTOLIN HFA) 108 (90 Base) MCG/ACT inhaler Inhale 2 puffs into the lungs every 6 (six) hours as needed for wheezing or shortness of breath.   Yes [provider]  gabapentin (NEURONTIN) 600 MG tablet Take 600 mg by mouth 3 (three) times daily.   Yes [provider]  ibuprofen (ADVIL) 200 MG tablet Take 800 mg by mouth every 6 (six) hours as needed for moderate pain.   Yes [provider]  methadone (DOLOPHINE) 10 MG/5ML solution Take 100 mg by mouth daily.   Yes [provider]   No results found.  Positive ROS: All other systems have been reviewed and were otherwise negative with the exception of those mentioned in the HPI and as above.  Physical Exam: General: Alert, no acute distress Cardiovascular: No pedal edema Respiratory: No cyanosis, no use of accessory musculature GI: No organomegaly, abdomen is soft and non-tender Skin: No lesions in the area of chief complaint Neurologic: Sensation intact distally Psychiatric: Patient is competent for consent with normal mood and affect Lymphatic: No axillary or cervical lymphadenopathy  MUSCULOSKELETAL:  Right upper extremity is atrophied but no open wounds.  No lesions.  Distally neurovascularly intact.  Assessment: Right shoulder osteoarthropathy.  Plan: -  Plans to proceed today with arthroplasty of the right shoulder with hemiarthroplasty versus total arthroplasty depending on the bone stock and the glenoid.  He has quite a bit of atrophic changes of the glenoid and proximal humerus.  -We again reviewed the risk of bleeding, infection, damage to surrounding nerves and vessels, failure of rotator cuff, need for further surgery, need for revision surgery, and the risk of anesthesia.  He has provided informed consent.    Nicholes Stairs, MD Cell [phone]    02/11/2021 12:33 PM

## 2021-02-11 NOTE — Discharge Instructions (Signed)
-  Maintain postoperative bandage to your follow-up appointment.  You may shower with this bandage in place.  If it become saturated you should remove and change with a daily daily dry dressing.  Do not submerge underwater. -No lifting with right arm -Maintain arm in sling at all times.  -You should apply ice to the right shoulder for 20 to 30 minutes out of each hour that you are able around-the-clock.  -Return to see Dr. Stann Mainland in 2 weeks for routine postoperative check.

## 2021-02-11 NOTE — Anesthesia Procedure Notes (Signed)
Anesthesia Regional Block: Interscalene brachial plexus block   Pre-Anesthetic Checklist: ,, timeout performed, Correct Patient, Correct Site, Correct Laterality, Correct Procedure, Correct Position, site marked, Risks and benefits discussed,  Surgical consent,  Pre-op evaluation,  At surgeon's request and post-op pain management  Laterality: Right  Prep: chloraprep       Needles:  Injection technique: Single-shot  Needle Type: Echogenic Stimulator Needle     Needle Length: 9cm  Needle Gauge: 21     Additional Needles:   Procedures:, nerve stimulator,,, ultrasound used (permanent image in chart),,,,   Nerve Stimulator or Paresthesia:  Response: deltoid and bicep,   Additional Responses:   Narrative:  Start time: 02/11/2021 11:57 AM End time: 02/11/2021 12:02 PM Injection made incrementally with aspirations every 5 mL.  Performed by: Personally  Anesthesiologist: Suzette Battiest, MD

## 2021-02-11 NOTE — Op Note (Signed)
02/11/2021  2:43 PM  PATIENT:  Ernest Reese    PRE-OPERATIVE DIAGNOSIS:   1.  Right shoulder osteoarthropathy 2.  Right shoulder proximal humerus collapse with atrophic changes of the glenoid and proximal humerus  POST-OPERATIVE DIAGNOSIS:  Same  PROCEDURE: Right shoulder hemiarthroplasty  SURGEON:  Nicholes Stairs, MD  PHYSICIAN ASSISTANT:  Jonelle Sidle, PA-C.   Assistant attestation: A Mcclung was utilized throughout the procedure for positioning patient, approach to the shoulder, deep retraction, trialing and implantation of final implants.  He was also utilized for complex closure of wound and application of postop dressings and sling.  ANESTHESIA:   General with an interscalene block  ESTIMATED BLOOD LOSS: 150 cc  UNIQUE ASPECTS OF THE CASE:   There were significant adhesions in the subpectoralis region, subdeltoid region, and subconjoined tendon region.  These were assumed to be part of the chronicity of the pathology.  There was some clear fluid in the joint itself but no concerning signs for infectious process.  There was hypertrophic synovium in the glenohumeral joint with essentially no contact of the residual humeral head with a glenoid surface.  This was a pseudoarthrosis.  Furthermore, the humeral head was completely devoid of any articular cartilage, and had significant atrophic changes distal to the native surgical neck.  The subscapularis was intact, the teres minor and infraspinatus were intact.  There was thinning significantly of the leading edge of the supraspinatus but no full-thickness tear.  PREOPERATIVE INDICATIONS:  Ernest Reese is a  50 y.o. male who failed conservative measures and elected for surgical management.    The risks benefits and alternatives were discussed with the patient preoperatively including but not limited to the risks of infection, bleeding, nerve injury, cardiopulmonary complications, the need for revision surgery, dislocation,  loosening, incomplete relief of pain, among others, and the patient was willing to proceed.   OPERATIVE IMPLANTS: Biomet size 8 mini press-fit humeral stem, 38 mm humeral head with eccentric position posterior.   OPERATIVE PROCEDURE: The patient was brought to the operating room and placed in the supine position. General anesthesia was administered. IV antibiotics were given.  The upper extremity was prepped and draped in usual sterile fashion. The patient was in a beachchair position with all bony prominences padded.   Time out was performed and a deltopectoral approach was carried out. The biceps tendon was tenotomized.  the subscapularis was released, tagging it with a #2 FiberWire, leaving a cuff of tendon for repair.   Upon dislocating the glenohumeral joint we noted that there was significant anterior and superior inferior deformity of the proximal humerus.  There was no apparent articular cartilage, and the deformity carried quite a bit distal beyond the calcar.  The articulation with the glenoid was overgrown with hypertrophic synovium in a very proliferative fashion.  There were no signs of frank purulence or pus.  The inferior osteophyte was removed, and release of the capsule off of the humeral side was completed. The head was dislocated, and I reamed sequentially. I placed the humeral cutting guide at 30 of retroversion, and then pinned this into place, and made my humeral neck cut. This was at the appropriate level.   I then placed deep retractors and exposed the glenoid. I excised the labrum circumferentially, taking care to protect the axillary nerve inferiorly.  We did also resect the hypertrophic synovium.  At this time we were able to visualize the glenoid.  This was a quite atrophic glenoid with anterior to posterior  dimension measuring about 15 mm.  Based on preoperative MRI this was a bit smaller than we had anticipated.  At this juncture we elected to not attempt to implant a  glenoid component due to fear for violating the vault.  I sequentially broached, up to the selected size, with the broach set at 30 of retroversion. I placed 3 max braid tapes.  I then placed the real stem. I trialed with multiple heads, and the above-named component was selected. Increased posterior coverage improved the coverage. The soft tissue tension was appropriate.   I then impacted the real humeral head into place, reduced the head, and irrigated copiously. Excellent stability and range of motion was achieved. I repaired the subscapularis with a total of 4 broadband tapes; one for the interval, one for the corner, and then the remaining three from the lesser tuberosity which had already been passed.  Excellent repair achieved and I irrigated copiously once more. The subcutaneous tissue was closed with Vicryl including the deltopectoral fascia.   The skin was closed with Steri-Strips and sterile gauze was applied. He had a preoperative nerve block. He tolerated the procedure well and there were no complications.    Disposition:  Ernest Reese will be in a sling for approximately 6 weeks.  This will allow for subscapularis healing.  He will be on the hemiarthroplasty postoperative protocol.  No lifting for 6 weeks.  He will be discharged home from PACU.  He will follow-up with me in the office in 2 weeks.

## 2021-02-11 NOTE — Anesthesia Procedure Notes (Signed)
Procedure Name: Intubation Date/Time: 02/11/2021 12:56 PM Performed by: Griffin Dakin, CRNA Pre-anesthesia Checklist: Patient identified, Emergency Drugs available, Suction available and Patient being monitored Patient Re-evaluated:Patient Re-evaluated prior to induction Oxygen Delivery Method: Circle system utilized Preoxygenation: Pre-oxygenation with 100% oxygen Induction Type: IV induction Ventilation: Mask ventilation without difficulty Laryngoscope Size: Mac and 4 Grade View: Grade II Tube type: Oral Tube size: 7.5 mm Number of attempts: 1 Airway Equipment and Method: Stylet Placement Confirmation: ETT inserted through vocal cords under direct vision,  positive ETCO2 and breath sounds checked- equal and bilateral Secured at: 23 cm Tube secured with: Tape Dental Injury: Teeth and Oropharynx as per pre-operative assessment

## 2021-02-11 NOTE — Anesthesia Preprocedure Evaluation (Addendum)
Anesthesia Evaluation  Patient identified by MRN, date of birth, ID band Patient awake    Reviewed: Allergy & Precautions, NPO status , Patient's Chart, lab work & pertinent test results  Airway Mallampati: II  TM Distance: >3 FB     Dental  (+) Dental Advisory Given   Pulmonary COPD, Current Smoker,    breath sounds clear to auscultation + wheezing      Cardiovascular negative cardio ROS   Rhythm:Regular Rate:Normal     Neuro/Psych negative neurological ROS     GI/Hepatic negative GI ROS, (+)     substance abuse (Hx of opiate abuse)  , Hepatitis -, C  Endo/Other  negative endocrine ROS  Renal/GU Renal disease     Musculoskeletal   Abdominal   Peds  Hematology  (+) anemia ,   Anesthesia Other Findings   Reproductive/Obstetrics                            Anesthesia Physical Anesthesia Plan  ASA: III  Anesthesia Plan: General   Post-op Pain Management:  Regional for Post-op pain   Induction: Intravenous  PONV Risk Score and Plan: 1 and Dexamethasone, Ondansetron and Treatment may vary due to age or medical condition  Airway Management Planned: Oral ETT  Additional Equipment:   Intra-op Plan:   Post-operative Plan: Extubation in OR  Informed Consent: I have reviewed the patients History and Physical, chart, labs and discussed the procedure including the risks, benefits and alternatives for the proposed anesthesia with the patient or authorized representative who has indicated his/her understanding and acceptance.     Dental advisory given  Plan Discussed with: CRNA  Anesthesia Plan Comments:        Anesthesia Quick Evaluation

## 2021-02-11 NOTE — Transfer of Care (Signed)
Immediate Anesthesia Transfer of Care Note  Patient: Ernest Reese  Procedure(s) Performed: RIGHT SHOULDER HEMI-ARTHROPLASTY   (Right Shoulder)  Patient Location: PACU  Anesthesia Type:General and Regional  Level of Consciousness: awake, alert  and oriented  Airway & Oxygen Therapy: Patient Spontanous Breathing and Patient connected to face mask oxygen  Post-op Assessment: Report given to RN and Post -op Vital signs reviewed and stable  Post vital signs: Reviewed and stable  Last Vitals:  Vitals Value Taken Time  BP 126/81 02/11/21 1515  Temp    Pulse 81 02/11/21 1519  Resp 16 02/11/21 1519  SpO2 97 % 02/11/21 1519  Vitals shown include unvalidated device data.  Last Pain:  Vitals:   02/11/21 1036  TempSrc:   PainSc: 6       Patients Stated Pain Goal: 6 (57/01/77 9390)  Complications: No complications documented.

## 2021-02-11 NOTE — Brief Op Note (Signed)
02/11/2021  2:39 PM  PATIENT:  Lucy Woolever Griebel  50 y.o. male  PRE-OPERATIVE DIAGNOSIS:   1 Right shoulder osteoarthritis   POST-OPERATIVE DIAGNOSIS:  Right shoulder osteoarthritis  PROCEDURE:  Procedure(s) with comments: RIGHT SHOULDER HEMI-ARTHROPLASTY   (Right) - 2.5 HRS  SURGEON:  Surgeon(s) and Role:    * Stann Mainland, Elly Modena, MD - Primary   ASSISTANTS: Jonelle Sidle, PA-C   ANESTHESIA:   regional and general  EBL:  150 cc  BLOOD ADMINISTERED:none  DRAINS: none   LOCAL MEDICATIONS USED:  NONE  SPECIMEN:  No Specimen  DISPOSITION OF SPECIMEN:  N/A  COUNTS:  YES  TOURNIQUET:  * No tourniquets in log *  DICTATION: .Note written in EPIC  PLAN OF CARE: Discharge to home after PACU  PATIENT DISPOSITION:  PACU - hemodynamically stable.   Delay start of Pharmacological VTE agent (>24hrs) due to surgical blood loss or risk of bleeding: not applicable

## 2021-02-12 ENCOUNTER — Encounter (HOSPITAL_COMMUNITY): Payer: Self-pay | Admitting: Orthopedic Surgery

## 2021-02-12 NOTE — Anesthesia Postprocedure Evaluation (Signed)
Anesthesia Post Note  Patient: Ernest Reese  Procedure(s) Performed: RIGHT SHOULDER HEMI-ARTHROPLASTY   (Right Shoulder)     Patient location during evaluation: PACU Anesthesia Type: General Level of consciousness: awake and alert Pain management: pain level controlled Vital Signs Assessment: post-procedure vital signs reviewed and stable Respiratory status: spontaneous breathing, nonlabored ventilation, respiratory function stable and patient connected to nasal cannula oxygen Cardiovascular status: blood pressure returned to baseline and stable Postop Assessment: no apparent nausea or vomiting Anesthetic complications: no   No complications documented.  Last Vitals:  Vitals:   02/11/21 1615 02/11/21 1637  BP: 123/77   Pulse: 86   Resp: 18   Temp:  (!) 36.1 C  SpO2: 91%     Last Pain:  Vitals:   02/11/21 1615  TempSrc:   PainSc: 0-No pain                 Tiajuana Amass

## 2021-02-24 ENCOUNTER — Emergency Department (HOSPITAL_COMMUNITY): Payer: Medicare HMO

## 2021-02-24 ENCOUNTER — Encounter (HOSPITAL_COMMUNITY): Payer: Self-pay | Admitting: Family Medicine

## 2021-02-24 ENCOUNTER — Other Ambulatory Visit: Payer: Self-pay

## 2021-02-24 ENCOUNTER — Inpatient Hospital Stay (HOSPITAL_COMMUNITY)
Admission: EM | Admit: 2021-02-24 | Discharge: 2021-03-03 | DRG: 856 | Disposition: A | Discharge status: Home / Self Care | Payer: Medicare HMO | Attending: Family Medicine | Admitting: Family Medicine

## 2021-02-24 DIAGNOSIS — M542 Cervicalgia: Secondary | ICD-10-CM

## 2021-02-24 DIAGNOSIS — J441 Chronic obstructive pulmonary disease with (acute) exacerbation: Secondary | ICD-10-CM | POA: Diagnosis not present

## 2021-02-24 DIAGNOSIS — T8141XA Infection following a procedure, superficial incisional surgical site, initial encounter: Principal | ICD-10-CM

## 2021-02-24 DIAGNOSIS — R0600 Dyspnea, unspecified: Secondary | ICD-10-CM | POA: Diagnosis present

## 2021-02-24 DIAGNOSIS — J9601 Acute respiratory failure with hypoxia: Secondary | ICD-10-CM | POA: Diagnosis present

## 2021-02-24 DIAGNOSIS — F419 Anxiety disorder, unspecified: Secondary | ICD-10-CM | POA: Diagnosis not present

## 2021-02-24 DIAGNOSIS — R7881 Bacteremia: Secondary | ICD-10-CM

## 2021-02-24 DIAGNOSIS — J439 Emphysema, unspecified: Secondary | ICD-10-CM | POA: Diagnosis present

## 2021-02-24 DIAGNOSIS — Z20822 Contact with and (suspected) exposure to covid-19: Secondary | ICD-10-CM | POA: Diagnosis present

## 2021-02-24 DIAGNOSIS — A419 Sepsis, unspecified organism: Secondary | ICD-10-CM | POA: Diagnosis present

## 2021-02-24 DIAGNOSIS — R918 Other nonspecific abnormal finding of lung field: Secondary | ICD-10-CM

## 2021-02-24 DIAGNOSIS — K5900 Constipation, unspecified: Secondary | ICD-10-CM | POA: Diagnosis present

## 2021-02-24 DIAGNOSIS — I2690 Septic pulmonary embolism without acute cor pulmonale: Secondary | ICD-10-CM | POA: Diagnosis present

## 2021-02-24 DIAGNOSIS — J449 Chronic obstructive pulmonary disease, unspecified: Secondary | ICD-10-CM

## 2021-02-24 DIAGNOSIS — Z8619 Personal history of other infectious and parasitic diseases: Secondary | ICD-10-CM | POA: Diagnosis present

## 2021-02-24 DIAGNOSIS — A4101 Sepsis due to Methicillin susceptible Staphylococcus aureus: Secondary | ICD-10-CM | POA: Diagnosis present

## 2021-02-24 DIAGNOSIS — N289 Disorder of kidney and ureter, unspecified: Secondary | ICD-10-CM

## 2021-02-24 DIAGNOSIS — M009 Pyogenic arthritis, unspecified: Secondary | ICD-10-CM | POA: Diagnosis present

## 2021-02-24 DIAGNOSIS — L02413 Cutaneous abscess of right upper limb: Secondary | ICD-10-CM | POA: Diagnosis present

## 2021-02-24 DIAGNOSIS — T8149XA Infection following a procedure, other surgical site, initial encounter: Secondary | ICD-10-CM

## 2021-02-24 DIAGNOSIS — Z5329 Procedure and treatment not carried out because of patient's decision for other reasons: Secondary | ICD-10-CM

## 2021-02-24 DIAGNOSIS — Z96611 Presence of right artificial shoulder joint: Secondary | ICD-10-CM

## 2021-02-24 DIAGNOSIS — Z720 Tobacco use: Secondary | ICD-10-CM | POA: Diagnosis present

## 2021-02-24 DIAGNOSIS — I38 Endocarditis, valve unspecified: Secondary | ICD-10-CM | POA: Diagnosis present

## 2021-02-24 DIAGNOSIS — I700 Atherosclerosis of aorta: Secondary | ICD-10-CM | POA: Diagnosis present

## 2021-02-24 DIAGNOSIS — Z79891 Long term (current) use of opiate analgesic: Secondary | ICD-10-CM

## 2021-02-24 DIAGNOSIS — J984 Other disorders of lung: Secondary | ICD-10-CM

## 2021-02-24 DIAGNOSIS — J15211 Pneumonia due to Methicillin susceptible Staphylococcus aureus: Secondary | ICD-10-CM | POA: Diagnosis present

## 2021-02-24 DIAGNOSIS — L03113 Cellulitis of right upper limb: Secondary | ICD-10-CM | POA: Diagnosis present

## 2021-02-24 DIAGNOSIS — G4700 Insomnia, unspecified: Secondary | ICD-10-CM | POA: Diagnosis not present

## 2021-02-24 DIAGNOSIS — B1920 Unspecified viral hepatitis C without hepatic coma: Secondary | ICD-10-CM | POA: Diagnosis present

## 2021-02-24 DIAGNOSIS — Z8547 Personal history of malignant neoplasm of testis: Secondary | ICD-10-CM

## 2021-02-24 DIAGNOSIS — B9561 Methicillin susceptible Staphylococcus aureus infection as the cause of diseases classified elsewhere: Secondary | ICD-10-CM

## 2021-02-24 DIAGNOSIS — Z8701 Personal history of pneumonia (recurrent): Secondary | ICD-10-CM

## 2021-02-24 DIAGNOSIS — N179 Acute kidney failure, unspecified: Secondary | ICD-10-CM | POA: Diagnosis present

## 2021-02-24 DIAGNOSIS — G8929 Other chronic pain: Secondary | ICD-10-CM | POA: Diagnosis present

## 2021-02-24 DIAGNOSIS — J949 Pleural condition, unspecified: Secondary | ICD-10-CM | POA: Diagnosis present

## 2021-02-24 DIAGNOSIS — F17210 Nicotine dependence, cigarettes, uncomplicated: Secondary | ICD-10-CM | POA: Diagnosis present

## 2021-02-24 DIAGNOSIS — E876 Hypokalemia: Secondary | ICD-10-CM | POA: Diagnosis not present

## 2021-02-24 DIAGNOSIS — J869 Pyothorax without fistula: Secondary | ICD-10-CM

## 2021-02-24 DIAGNOSIS — F1910 Other psychoactive substance abuse, uncomplicated: Secondary | ICD-10-CM | POA: Diagnosis present

## 2021-02-24 DIAGNOSIS — D500 Iron deficiency anemia secondary to blood loss (chronic): Secondary | ICD-10-CM | POA: Diagnosis present

## 2021-02-24 DIAGNOSIS — Z79899 Other long term (current) drug therapy: Secondary | ICD-10-CM

## 2021-02-24 HISTORY — DX: Multiple fractures of ribs, unspecified side, initial encounter for closed fracture: S22.49XA

## 2021-02-24 HISTORY — DX: Infection following a procedure, other surgical site, initial encounter: T81.49XA

## 2021-02-24 LAB — BASIC METABOLIC PANEL
Anion gap: 14 (ref 5–15)
BUN: 30 mg/dL — ABNORMAL HIGH (ref 6–20)
CO2: 27 mmol/L (ref 22–32)
Calcium: 8.6 mg/dL — ABNORMAL LOW (ref 8.9–10.3)
Chloride: 99 mmol/L (ref 98–111)
Creatinine, Ser: 1.52 mg/dL — ABNORMAL HIGH (ref 0.61–1.24)
GFR, Estimated: 56 mL/min — ABNORMAL LOW (ref >60)
Glucose, Bld: 128 mg/dL — ABNORMAL HIGH (ref 70–99)
Potassium: 3.6 mmol/L (ref 3.5–5.1)
Sodium: 140 mmol/L (ref 135–145)

## 2021-02-24 LAB — CBC WITH DIFFERENTIAL/PLATELET
Abs Immature Granulocytes: 0.11 10*3/uL — ABNORMAL HIGH (ref 0.00–0.07)
Basophils Absolute: 0 10*3/uL (ref 0.0–0.1)
Basophils Relative: 0 %
Eosinophils Absolute: 0.1 10*3/uL (ref 0.0–0.5)
Eosinophils Relative: 1 %
HCT: 35.8 % — ABNORMAL LOW (ref 39.0–52.0)
Hemoglobin: 11.3 g/dL — ABNORMAL LOW (ref 13.0–17.0)
Immature Granulocytes: 1 %
Lymphocytes Relative: 3 %
Lymphs Abs: 0.6 10*3/uL — ABNORMAL LOW (ref 0.7–4.0)
MCH: 27.8 pg (ref 26.0–34.0)
MCHC: 31.6 g/dL (ref 30.0–36.0)
MCV: 88 fL (ref 80.0–100.0)
Monocytes Absolute: 1.5 10*3/uL — ABNORMAL HIGH (ref 0.1–1.0)
Monocytes Relative: 8 %
Neutro Abs: 16.1 10*3/uL — ABNORMAL HIGH (ref 1.7–7.7)
Neutrophils Relative %: 87 %
Platelets: 243 10*3/uL (ref 150–400)
RBC: 4.07 MIL/uL — ABNORMAL LOW (ref 4.22–5.81)
RDW: 17.2 % — ABNORMAL HIGH (ref 11.5–15.5)
WBC: 18.5 10*3/uL — ABNORMAL HIGH (ref 4.0–10.5)
nRBC: 0 % (ref 0.0–0.2)

## 2021-02-24 LAB — BRAIN NATRIURETIC PEPTIDE: B Natriuretic Peptide: 305.6 pg/mL — ABNORMAL HIGH (ref 0.0–100.0)

## 2021-02-24 LAB — RESP PANEL BY RT-PCR (FLU A&B, COVID) ARPGX2
Influenza A by PCR: NEGATIVE
Influenza B by PCR: NEGATIVE
SARS Coronavirus 2 by RT PCR: NEGATIVE

## 2021-02-24 MED ORDER — SODIUM CHLORIDE 0.9 % IV BOLUS
500.0000 mL | Freq: Once | INTRAVENOUS | Status: AC
  Administered 2021-02-24: 500 mL via INTRAVENOUS

## 2021-02-24 MED ORDER — CLINDAMYCIN PHOSPHATE 600 MG/50ML IV SOLN
600.0000 mg | Freq: Once | INTRAVENOUS | Status: AC
  Administered 2021-02-24: 600 mg via INTRAVENOUS
  Filled 2021-02-24: qty 50

## 2021-02-24 MED ORDER — IPRATROPIUM-ALBUTEROL 0.5-2.5 (3) MG/3ML IN SOLN
3.0000 mL | RESPIRATORY_TRACT | Status: DC
  Administered 2021-02-24 – 2021-02-26 (×9): 3 mL via RESPIRATORY_TRACT
  Filled 2021-02-24 (×10): qty 3

## 2021-02-24 MED ORDER — METHADONE HCL 10 MG PO TABS
100.0000 mg | ORAL_TABLET | Freq: Every day | ORAL | Status: DC
  Administered 2021-02-25 – 2021-03-03 (×7): 100 mg via ORAL
  Filled 2021-02-24 (×8): qty 10

## 2021-02-24 MED ORDER — ACETAMINOPHEN 325 MG PO TABS
650.0000 mg | ORAL_TABLET | Freq: Four times a day (QID) | ORAL | Status: DC | PRN
  Administered 2021-02-25 (×2): 650 mg via ORAL
  Filled 2021-02-24 (×2): qty 2

## 2021-02-24 MED ORDER — IPRATROPIUM-ALBUTEROL 0.5-2.5 (3) MG/3ML IN SOLN: 3.0000 mL | Freq: Four times a day (QID) | RESPIRATORY_TRACT | Status: DC

## 2021-02-24 MED ORDER — METHYLPREDNISOLONE SODIUM SUCC 125 MG IJ SOLR
125.0000 mg | Freq: Once | INTRAMUSCULAR | Status: AC
  Administered 2021-02-24: 125 mg via INTRAVENOUS
  Filled 2021-02-24: qty 2

## 2021-02-24 MED ORDER — PIPERACILLIN-TAZOBACTAM 3.375 G IVPB
3.3750 g | Freq: Three times a day (TID) | INTRAVENOUS | Status: DC
  Administered 2021-02-24 – 2021-02-26 (×5): 3.375 g via INTRAVENOUS
  Filled 2021-02-24 (×6): qty 50

## 2021-02-24 MED ORDER — NICOTINE 21 MG/24HR TD PT24
21.0000 mg | MEDICATED_PATCH | Freq: Every day | TRANSDERMAL | Status: DC
  Administered 2021-02-24 – 2021-03-03 (×8): 21 mg via TRANSDERMAL
  Filled 2021-02-24 (×8): qty 1

## 2021-02-24 MED ORDER — VANCOMYCIN HCL 1750 MG/350ML IV SOLN
1750.0000 mg | INTRAVENOUS | Status: DC
  Administered 2021-02-24 – 2021-02-25 (×2): 1750 mg via INTRAVENOUS
  Filled 2021-02-24 (×2): qty 350

## 2021-02-24 MED ORDER — POLYETHYLENE GLYCOL 3350 17 G PO PACK: 17.0000 g | PACK | Freq: Every day | ORAL | Status: DC | PRN

## 2021-02-24 MED ORDER — SODIUM CHLORIDE 0.9 % IV SOLN
INTRAVENOUS | Status: DC
  Administered 2021-02-28: 100 mL via INTRAVENOUS

## 2021-02-24 MED ORDER — ENOXAPARIN SODIUM 40 MG/0.4ML ~~LOC~~ SOLN
40.0000 mg | SUBCUTANEOUS | Status: DC
  Administered 2021-02-24 – 2021-03-01 (×6): 40 mg via SUBCUTANEOUS
  Filled 2021-02-24 (×6): qty 0.4

## 2021-02-24 MED ORDER — ORAL CARE MOUTH RINSE
15.0000 mL | Freq: Two times a day (BID) | OROMUCOSAL | Status: DC
  Administered 2021-02-25 – 2021-03-02 (×8): 15 mL via OROMUCOSAL

## 2021-02-24 MED ORDER — ALBUTEROL (5 MG/ML) CONTINUOUS INHALATION SOLN
20.0000 mg/h | INHALATION_SOLUTION | Freq: Once | RESPIRATORY_TRACT | Status: AC
  Administered 2021-02-24: 20 mg/h via RESPIRATORY_TRACT
  Filled 2021-02-24: qty 20

## 2021-02-24 MED ORDER — ONDANSETRON HCL 4 MG/2ML IJ SOLN: 4.0000 mg | Freq: Once | INTRAMUSCULAR | Status: AC

## 2021-02-24 MED ORDER — ACETAMINOPHEN 650 MG RE SUPP: 650.0000 mg | Freq: Four times a day (QID) | RECTAL | Status: DC | PRN

## 2021-02-24 MED ORDER — ALBUTEROL SULFATE (2.5 MG/3ML) 0.083% IN NEBU: 2.5000 mg | INHALATION_SOLUTION | RESPIRATORY_TRACT | Status: DC | PRN

## 2021-02-24 MED ORDER — ALBUTEROL SULFATE (2.5 MG/3ML) 0.083% IN NEBU
2.5000 mg | INHALATION_SOLUTION | Freq: Once | RESPIRATORY_TRACT | Status: AC
  Administered 2021-02-24: 2.5 mg via RESPIRATORY_TRACT
  Filled 2021-02-24: qty 3

## 2021-02-24 MED ORDER — ONDANSETRON HCL 4 MG/2ML IJ SOLN
INTRAMUSCULAR | Status: AC
  Administered 2021-02-24: 4 mg via INTRAVENOUS
  Filled 2021-02-24: qty 2

## 2021-02-24 MED ORDER — IOHEXOL 350 MG/ML SOLN
100.0000 mL | Freq: Once | INTRAVENOUS | Status: AC | PRN
  Administered 2021-02-24: 100 mL via INTRAVENOUS

## 2021-02-24 NOTE — Progress Notes (Signed)
   02/24/21 1837  Assess: MEWS Score  Level of Consciousness Alert  Assess: MEWS Score  MEWS Temp 0  MEWS Systolic 0  MEWS Pulse 1  MEWS RR 1  MEWS LOC 0  MEWS Score 2  MEWS Score Color Yellow  Assess: if the MEWS score is Yellow or Red  Were vital signs taken at a resting state? Yes  Focused Assessment No change from prior assessment  Early Detection of Sepsis Score *See Row Information* Low  MEWS guidelines implemented *See Row Information* Yes  Take Vital Signs  Increase Vital Sign Frequency  Yellow: Q 2hr X 2 then Q 4hr X 2, if remains yellow, continue Q 4hrs  Escalate  MEWS: Escalate Yellow: discuss with charge nurse/RN and consider discussing with provider and RRT  Notify: Charge Nurse/RN  Name of Charge Nurse/RN Notified Erin, RN  Date Charge Nurse/RN Notified 02/24/21  Time Charge Nurse/RN Notified 1839  Notify: Provider  Provider Name/Title Dr McDiarmid  Date Provider Notified 02/24/21  Time Provider Notified [phone]  Notification Type Page  Document  Patient Outcome Other (Comment) (Stable - continue to monitor)

## 2021-02-24 NOTE — ED Triage Notes (Signed)
Pt recently had shoulder surgery. Here today for SOB and rash to arm. States his breathing is way worse today. Pt wheezing upon assessment.

## 2021-02-24 NOTE — ED Notes (Signed)
Teletracking (Transporter)Ordered @ 1754-per Jarrett Soho, Therapist, sports Ordered by [facility]

## 2021-02-24 NOTE — Progress Notes (Signed)
Due to clinical presentation, pt is unable to wait for covid results before receiving continuous nebulizer. MD agrees. RN aware.

## 2021-02-24 NOTE — H&P (Cosign Needed Addendum)
[facility] Admission History and Physical Service Pager: [phone]  Patient name: Ernest Reese Medical record number: [id number] Date of birth: Mar 13, 1971 Age: 50 y.o. Gender: male  Primary Care Provider: Patient, No Pcp Per Consultants: Ortho  Code Status: Full  Preferred Emergency Contact: Ernest Reese (daughter) [phone]  Chief Complaint: SOB and shoulder pain   Assessment and Plan: Ernest Reese is a 50 y.o. male presenting with SOB and shoulder pain, s/p arthroplasty 2 weeks ago. PMH is significant for COPD, testicular cancer, anemia, tobacco use, [name]   SOB likely multifactorial 2/2 multifocal pneumonia, COPD exacerbation Patient presents with shortness of breath for the past week. Reports using  his albuterol daily for the past week.  In the ED was hypoxic to 88%. Was placed on 2 L nasal cannula. Received continuous nebulizer, Clindamycin 600mg , methylprednisolone, 1L NS bolus. Labs significant for WBC 18.8 with left shift. BNP elevated at 305.6, BUN 30, Cr 1.52, Hgb 11.3. CXR shows increased interstitial markings within the right perihilar and right basilar regions, and ovoid opacity within the peripheral aspect of the right mid lung, potentially representing pleural fluid within the minor fissure. Air projects within the soft tissues overlying the right shoulder, which may be related to recent postsurgical change. CTA negative for pulmonary emboli and showed multifocal areas of fluid within the right pleural space and fissures with some associated areas of cavitation as well as multifocal parenchymal inflammatory change. Changes were not present in January of 2022 and given their abrupt onset are consistent with multifocal infection/pneumonia. Recent MRSA swab positive on 2/11.  On exam, patient with extensive wheezing in all lung fields. Patient tachycardic to 109 (max 150) and hypoxic. Differentials include MRSA pneumonia s/p R shoulder arthroplasty  and recent surgical infection, as well as septic emboli given his history of IVDU. PE less likely due to CTA negative and Wells score 1.5. Orthopedics consulted and evaluated in ED. Admitting patient for IV antibiotics and close monitoring of his respiratory status.  - admit to [place], attending Dr. McDiarmid - ortho consulted, appreciate recommendations  - albuterol inhaler as needed - duonebs q 4 - f/u blood culture - Switch to Vanc and Zosyn. De-escalate as appropriate  - echo  - consider steroids in am  - am BMP, CBC  Arthroplasty of left shoulder  Shoulder surgical site infection  Surgery 2 weeks ago.  Patient presents with cellulitis of postop wound.  He has not been seen by Ortho after his surgery.  Consulted and recommended Clindamycin 600mg  TID. Ortho following, appreciate continued involvement and recommended IV Clindamycin.  After reviewing chart, patient was noted to have positive MRSA screen prior to surgery.  Given new onset hypoxia, tachypnea and tachycardia, concern for MRSA PNA is high on differential.  Will discontinue Clindamycin and start Vancomycin and Zosyn.  - f/u ortho recs, sent secure message to PA to notify of changes to antibiotics - f/u with Dr. Stann Mainland outpatient   Renal Insufficiency Cr 1.52 on admission. Previous admissions have ranged from 1.14-8.59  - am BMP - avoid nephro toxic agents - N/S at 100/hr  Normocytic Anemia  Hgb 11.3. Baseline appears to be about 12-14.  No overt signs of bleeding. Normotensive. - consider iron studies  - am CBC -monitor for signs of bleeding  Tobacco Use  Reports 1 pack/day for his "whole life"  - Nicotine patch  - encourage smoking cessation  Previous heroin use  Endorses being off of heroin for the past year.  Reports taking methadone 100 mg every morning at 6 AM [facility].  States if he does not have this that he has pretty significant withdrawal - PDMP reviewed - Methadone 100 mg daily  FEN/GI: Regular  diet Prophylaxis: Lovenox  Disposition: Med-surg. Dispo pending Ortho  History of Present Illness:  Ernest Reese is a 50 y.o. male presenting with SOB and R shoulder pain   Patient states his increased shortness of breath started about 1 week ago.  Productive cough.  No hematemesis. No fevers or chills. Worse on exertion can walk about 20 feet.  Denies oxygen use at home.  States his right shoulder has been hurting since his shoulder surgery 2 weeks ago for a complicated fracture. Denies CP, palpitations, headache, change in vision, dysuria. Denies hx of blood clots   Reports has not been looking after himself. Lives with roommate who is often not in the house. .    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 Reports withdrawals if he does not receive it.    Review Of Systems: Per HPI with the following additions:  Review of Systems  Constitutional: Negative for chills, fatigue and fever.  Respiratory: Positive for cough and wheezing.   Gastrointestinal: Negative for abdominal pain.     Patient Active Problem List   Diagnosis Date Noted  . COPD exacerbation ([place]) 02/24/2021  . Surgical site infection 02/24/2021  . Dyspnea 02/24/2021  . Hx of hepatitis C 12/11/2018  . Injury of right rotator cuff 12/11/2018  . Renal insufficiency 12/09/2018  . COPD with acute exacerbation ([place]) 12/09/2018  . Acute kidney failure ([place]) 12/09/2018  . Anemia 12/09/2018  . ARF (acute renal failure) ([place]) 12/09/2018  . Malnutrition of moderate degree 12/09/2018  . Emphysema lung ([place])     Past Medical History: Past Medical History:  Diagnosis Date  . Emphysema lung ([place])   . Surgical site infection 02/24/2021  . Testicular cancer [facility])     Past Surgical History: Past Surgical History:  Procedure Laterality Date  . HAND SURGERY    . NECK  SURGERY    . REPLACEMENT TOTAL KNEE    . testicle removed    . TOTAL SHOULDER ARTHROPLASTY Right 02/11/2021   Procedure: RIGHT SHOULDER HEMI-ARTHROPLASTY  ;  Surgeon: Nicholes Stairs, MD;  Location: [facility];  Service: Orthopedics;  Laterality: Right;  2.5 HRS    Social History: Social History   Tobacco Use  . Smoking status: Current Every Day Smoker    Packs/day: 1.00    Types: Cigarettes  . Smokeless tobacco: Never Used  Vaping Use  . Vaping Use: Never used  Substance Use Topics  . Alcohol use: Not Currently  . Drug use: Not Currently    Comment: History of drug use, takes Methadone daily   Additional social history: Please also refer to relevant sections of EMR.  Family History: Family History  Problem Relation Age of Onset  . Cancer Father     Allergies and Medications: No Known Allergies No current facility-administered medications on file prior to encounter.   Current Outpatient Medications on File Prior to Encounter  Medication Sig Dispense Refill  . albuterol (PROVENTIL HFA;VENTOLIN HFA) 108 (90 Base) MCG/ACT inhaler Inhale into the lungs every 6 (six) hours as needed for wheezing or shortness of breath.    Marland Kitchen albuterol (PROVENTIL) (2.5  MG/3ML) 0.083% nebulizer solution Take 2.5 mg by nebulization every 6 (six) hours as needed for shortness of breath.    . gabapentin (NEURONTIN) 600 MG tablet Take 600 mg by mouth 3 (three) times daily.    Marland Kitchen ibuprofen (ADVIL) 200 MG tablet Take 800 mg by mouth every 6 (six) hours as needed for moderate pain.    . methadone (DOLOPHINE) 10 MG/5ML solution Take 100 mg by mouth daily.      Objective: BP 128/79   Pulse (!) 109   Temp 98.8 F (37.1 C) (Oral)   Resp 13   SpO2 90%  Exam: General:Patient alert, jittery, NAD Eyes: conjunctiva normal, EOMI Neck: supple, normal ROM Cardiovascular: Tachycardic. Regular rhythm  Respiratory: extensive wheezing in all fields bilaterally Gastrointestinal: abdomen soft, non tender, non  distended MSK: moving extremities spontaneously, L arm shaking, no edema  Derm: skin warm, dry. R shoulder with scar from surgery and warm and erythematous Neuro: alert and oriented  Psych:mood is anxious  Labs and Imaging: CBC BMET  Recent Labs  Lab 02/24/21 1035  WBC 18.5*  HGB 11.3*  HCT 35.8*  PLT 243   Recent Labs  Lab 02/24/21 1035  NA 140  K 3.6  CL 99  CO2 27  BUN 30*  CREATININE 1.52*  GLUCOSE 128*  CALCIUM 8.6*     EKG: Sinus tachycardia   CT Angio Chest PE W and/or Wo Contrast  Result Date: 02/24/2021 CLINICAL DATA:  Shortness of breath, history of recent right shoulder surgery EXAM: CT ANGIOGRAPHY CHEST WITH CONTRAST TECHNIQUE: Multidetector CT imaging of the chest was performed using the standard protocol during bolus administration of intravenous contrast. Multiplanar CT image reconstructions and MIPs were obtained to evaluate the vascular anatomy. CONTRAST:  157mL OMNIPAQUE IOHEXOL 350 MG/ML SOLN COMPARISON:  Chest x-ray from earlier in the same day, 01/06/2021. FINDINGS: Cardiovascular: Thoracic aorta demonstrates mild atherosclerotic calcifications without aneurysmal dilatation or dissection. Mild coronary calcifications are seen. No cardiac enlargement is noted. The pulmonary artery is well visualized and shows a normal branching pattern bilaterally. No filling defects to suggest pulmonary emboli are noted. Mediastinum/Nodes: Thoracic inlet is within normal limits. No sizable hilar or mediastinal adenopathy is noted. The esophagus as visualized is within normal limits. Lungs/Pleura: Left lung is well aerated with some patchy ground-glass opacities in the upper lobe which may represent atypical pneumonia. The right lung also demonstrates some patchy ground-glass opacities as well as multifocal areas of pleural based fluid density this specially with the major fissure. Some associated air is noted suggestive of focal cavitary lesions within the superior segment of the  right lower lobe best seen on image number 54 of series 7 as well as more inferiorly and laterally on image number 75 of series 7. These changes were not present on the prior plain film examination from January 2022. Upper Abdomen: Visualized upper abdomen shows no acute abnormality. Musculoskeletal: Postsurgical changes are noted in the proximal right humerus consistent with the known history of shoulder replacement. Air-fluid collection is noted upper right arm extending into the chest wall along the pectoralis muscle suggestive of postoperative abscess. The air-fluid collection measures approximately 9.7 x 4.7 cm Review of the MIP images confirms the above findings. IMPRESSION: No evidence of pulmonary emboli. Multifocal areas of fluid within the right pleural space and fissures with some associated areas of cavitation as well as multifocal parenchymal inflammatory change. Changes were not present in January of 2022 and given their abrupt onset are consistent with multifocal infection/pneumonia.  Postsurgical changes in the proximal right humerus with associated subcutaneous air fluid collection highly suspicious for postoperative abscess along the anterior aspect of the upper right arm and right pectoralis muscles. Aortic Atherosclerosis (ICD10-I70.0). Electronically Signed   By: Inez Catalina M.D.   On: 02/24/2021 13:08   DG Chest Portable 1 View  Result Date: 02/24/2021 CLINICAL DATA:  Shortness of breath. Right shoulder swelling. Leukocytosis. EXAM: PORTABLE CHEST 1 VIEW COMPARISON:  X-ray 01/06/2021 FINDINGS: Stable cardiomediastinal contours. Increased interstitial markings within the right perihilar and right basilar regions. Ovoid opacity within the peripheral aspect of the right mid lung, potentially representing pleural fluid within the minor fissure. Linear right basilar opacities. Interval postsurgical changes from right shoulder arthroplasty. There is air projecting within the soft tissues  overlying the right shoulder, which may be postsurgical given recent surgery. IMPRESSION: 1. Increased interstitial markings within the right perihilar and right basilar regions, could represent asymmetric edema versus atypical/viral infection. 2. Ovoid opacity within the peripheral aspect of the right mid lung, potentially representing pleural fluid within the minor fissure. 3. Postsurgical changes from right shoulder arthroplasty. Air projects within the soft tissues overlying the right shoulder, which may be related to recent postsurgical change. Superimposed infection would be difficult to exclude radiographically. Electronically Signed   By: Davina Poke D.O.   On: 02/24/2021 11:27    Shary Key, DO 02/24/2021, 6:31 PM PGY-1, [place] [facility] pager: [phone], text pages welcome  FPTS Upper-Level Resident Addendum   I have independently interviewed and examined the patient. I have discussed the above with the original author and agree with their documentation. Please see also any attending notes.    Carollee Leitz MD PGY-2, [facility] 02/24/2021 9:01 PM  FPTS Service pager: [phone] (text pages welcome through [facility])

## 2021-02-24 NOTE — Consult Note (Signed)
Reason for Consult:s/p TSA Referring Physician: Rosalyn Gess Time called: 2119 Time at bedside: Ernest Reese is an 50 y.o. male.  HPI: Ernest Reese comes to the ED mainly for SOB but has also been having significant right shoulder pain. He underwent reverse total shoulder by Dr. Stann Mainland on 2/15 and noticed increased pain Thursday and Friday of last week. He denies fevers, chills, sweats, N/V or discharge from the wound.  Past Medical History:  Diagnosis Date  . Emphysema lung ([place])   . Testicular cancer [facility])     Past Surgical History:  Procedure Laterality Date  . HAND SURGERY    . NECK SURGERY    . REPLACEMENT TOTAL KNEE    . testicle removed    . TOTAL SHOULDER ARTHROPLASTY Right 02/11/2021   Procedure: RIGHT SHOULDER HEMI-ARTHROPLASTY  ;  Surgeon: Nicholes Stairs, MD;  Location: [facility];  Service: Orthopedics;  Laterality: Right;  2.5 HRS    Family History  Problem Relation Age of Onset  . Cancer Father     Social History:  reports that he has been smoking cigarettes. He has been smoking about 1.00 pack per day. He has never used smokeless tobacco. He reports previous alcohol use. He reports previous drug use.  Allergies: No Known Allergies  Medications: I have reviewed the patient's current medications.  No results found for this or any previous visit (from the past 48 hour(s)).  No results found.  Review of Systems  Constitutional: Negative for chills, diaphoresis and fever.  HENT: Negative for ear discharge, ear pain, hearing loss and tinnitus.   Eyes: Negative for photophobia and pain.  Respiratory: Positive for shortness of breath. Negative for cough.   Cardiovascular: Negative for chest pain.  Gastrointestinal: Negative for abdominal pain, nausea and vomiting.  Genitourinary: Negative for dysuria, flank pain, frequency and urgency.  Musculoskeletal: Positive for arthralgias (Right shoulder). Negative for back pain, myalgias and neck pain.  Neurological:  Negative for dizziness and headaches.  Hematological: Does not bruise/bleed easily.  Psychiatric/Behavioral: The patient is not nervous/anxious.    Blood pressure 133/70, pulse (!) 150, temperature 98.2 F (36.8 C), temperature source Oral, resp. rate (!) 22, SpO2 91 %. Physical Exam Constitutional:      General: He is not in acute distress.    Appearance: He is well-developed and well-nourished. He is not diaphoretic.  HENT:     Head: Normocephalic and atraumatic.  Eyes:     General: No scleral icterus.       Right eye: No discharge.        Left eye: No discharge.     Conjunctiva/sclera: Conjunctivae normal.  Cardiovascular:     Rate and Rhythm: Normal rate and regular rhythm.  Pulmonary:     Effort: Pulmonary effort is normal. No respiratory distress.  Musculoskeletal:     Cervical back: Normal range of motion.     Comments: Right shoulder, elbow, wrist, digits- Shoulder incision C/D/I but surrounding erythema and some induration inferiorly, mod TTP, no instability, no blocks to motion  Sens  Ax/U intact, R/M paresthetic  Mot   Ax/ R/ PIN/ M/ AIN/ U intact  Rad 2+  Skin:    General: Skin is warm and dry.  Neurological:     Mental Status: He is alert.  Psychiatric:        Mood and Affect: Mood and affect normal.        Behavior: Behavior normal.     Assessment/Plan: Right shoulder surgical site  infection -- Recommend IV clindamycin. F/u with Dr. Stann Mainland on discharge. Sling, NWB RUE.    Lisette Abu, PA-C Orthopedic Surgery [phone] 02/24/2021, 11:00 AM

## 2021-02-24 NOTE — Progress Notes (Signed)
Pharmacy Antibiotic Note  Ernest Reese is a 50 y.o. male admitted on 02/24/2021 with shortness of breath and R shoulder pain. Hx R shoulder surgery on 02/11/21.  Pharmacy has been consulted for Vancomycin and Zosyn dosing for pneumonia. Also covering for R shoulder surgical site infection.   Clindamycin 600 mg IV x 1 given in ED.   Plan: Vancomycin 1750 mg IV Q 24 hrs. Goal AUC 400-550. Expected AUC: 508 SCr used: 1.52 Zosyn 3.375 gm IV q8hrs (each over 4 hours) Follow renal function, culture data, clinical progress and antibiotic plans. Vancomycin levels if indicated.  Height: 6' (182.9 cm) Weight: 81.7 kg (180 lb 1.9 oz) IBW/kg (Calculated) : 77.6  Temp (24hrs), Avg:98.6 F (37 C), Min:98.2 F (36.8 C), Max:98.8 F (37.1 C)  Recent Labs  Lab 02/24/21 1035  WBC 18.5*  CREATININE 1.52*    Estimated Creatinine Clearance: 65.2 mL/min (A) (by C-G formula based on SCr of 1.52 mg/dL (H)).    No Known Allergies  Antimicrobials this admission:  Clindamycin 600 IV x 1 on 2/28  Vancomycin 2/28 >>  Zosyn 2/28 >>  Dose adjustments this admission:  n/a  Microbiology results:  2/28 blood x 2: ng < 12 hrs to date  2/28 COVID and flu: negative   -  2/11 MRSA PCR: positive  Thank you for allowing pharmacy to be a part of this patient's care.  Arty Baumgartner, [place] 02/24/2021 7:37 PM

## 2021-02-24 NOTE — ED Provider Notes (Signed)
[facility] EMERGENCY DEPARTMENT Provider Note   CSN: [id number] Arrival date & time: 02/24/21  0941     History Chief Complaint  Patient presents with  . Shortness of Breath  . Rash    Ernest Reese is a 50 y.o. male.  Patient with COPD history, no home oxygen, surgery proximally 2 weeks ago for fracture right shoulder presents with worsening pain and redness around surgical site and worsening shortness of breath the past few days.  Patient has not seen orthopedics and surgery.  No history of blood clots.  Patient does have shortness of breath, cough and wheezing.  Patient says this feels similar COPD however more severe.  No leg swelling.  No fever chills or vomiting.        Past Medical History:  Diagnosis Date  . Emphysema lung ([place])   . Testicular cancer [facility])     Patient Active Problem List   Diagnosis Date Noted  . Hx of hepatitis C 12/11/2018  . Injury of right rotator cuff 12/11/2018  . Renal insufficiency 12/09/2018  . COPD with acute exacerbation ([place]) 12/09/2018  . Acute kidney failure ([place]) 12/09/2018  . Anemia 12/09/2018  . ARF (acute renal failure) ([place]) 12/09/2018  . Malnutrition of moderate degree 12/09/2018  . Emphysema lung ([place])     Past Surgical History:  Procedure Laterality Date  . HAND SURGERY    . NECK SURGERY    . REPLACEMENT TOTAL KNEE    . testicle removed    . TOTAL SHOULDER ARTHROPLASTY Right 02/11/2021   Procedure: RIGHT SHOULDER HEMI-ARTHROPLASTY  ;  Surgeon: Nicholes Stairs, MD;  Location: [place];  Service: Orthopedics;  Laterality: Right;  2.5 HRS       Family History  Problem Relation Age of Onset  . Cancer Father     Social History   Tobacco Use  . Smoking status: Current Every Day Smoker    Packs/day: 1.00    Types: Cigarettes  . Smokeless tobacco: Never Used  Vaping Use  . Vaping Use: Never used  Substance Use Topics  . Alcohol use: Not Currently  . Drug use: Not Currently    Comment:  History of drug use, takes Methadone daily    Home Medications Prior to Admission medications   Medication Sig Start Date End Date Taking? Authorizing Provider  albuterol (PROVENTIL HFA;VENTOLIN HFA) 108 (90 Base) MCG/ACT inhaler Inhale into the lungs every 6 (six) hours as needed for wheezing or shortness of breath.    [provider]  albuterol (PROVENTIL) (2.5 MG/3ML) 0.083% nebulizer solution Take 2.5 mg by nebulization every 6 (six) hours as needed for shortness of breath. 01/30/21   [provider]  albuterol (VENTOLIN HFA) 108 (90 Base) MCG/ACT inhaler Inhale 2 puffs into the lungs every 6 (six) hours as needed for wheezing or shortness of breath.    [provider]  gabapentin (NEURONTIN) 600 MG tablet Take 600 mg by mouth 3 (three) times daily.    [provider]  ibuprofen (ADVIL) 200 MG tablet Take 800 mg by mouth every 6 (six) hours as needed for moderate pain.    [provider]  ibuprofen (ADVIL) 800 MG tablet Take 1 tablet (800 mg total) by mouth every 8 (eight) hours as needed for mild pain or moderate pain. 02/11/21   Nicholes Stairs, MD  methadone (DOLOPHINE) 10 MG/5ML solution Take 100 mg by mouth daily.    [provider]  methocarbamol (ROBAXIN) 500 MG  tablet Take 1 tablet (500 mg total) by mouth every 6 (six) hours as needed for muscle spasms. 02/11/21   Nicholes Stairs, MD  ondansetron (ZOFRAN ODT) 4 MG disintegrating tablet Take 1 tablet (4 mg total) by mouth every 8 (eight) hours as needed. 02/11/21   Nicholes Stairs, MD    Allergies    Patient has no known allergies.  Review of Systems   Review of Systems  Constitutional: Positive for fatigue. Negative for chills and fever.  HENT: Positive for congestion.   Eyes: Negative for visual disturbance.  Respiratory: Positive for cough and shortness of breath.   Cardiovascular: Negative for chest pain.  Gastrointestinal: Negative for abdominal pain and  vomiting.  Genitourinary: Negative for dysuria and flank pain.  Musculoskeletal: Negative for back pain, neck pain and neck stiffness.  Skin: Positive for rash.  Neurological: Negative for light-headedness and headaches.    Physical Exam Updated Vital Signs BP 120/81   Pulse (!) 101   Temp 98.2 F (36.8 C) (Oral)   Resp (!) 7   SpO2 93%   Physical Exam Vitals and nursing note reviewed.  Constitutional:      Appearance: He is well-developed and well-nourished.  HENT:     Head: Normocephalic and atraumatic.  Eyes:     General:        Right eye: No discharge.        Left eye: No discharge.     Conjunctiva/sclera: Conjunctivae normal.  Neck:     Trachea: No tracheal deviation.  Cardiovascular:     Rate and Rhythm: Normal rate and regular rhythm.  Pulmonary:     Effort: Tachypnea present.     Breath sounds: Examination of the right-middle field reveals wheezing. Examination of the left-middle field reveals wheezing. Examination of the right-lower field reveals wheezing. Examination of the left-lower field reveals wheezing. Decreased breath sounds and wheezing present.  Abdominal:     General: There is no distension.     Palpations: Abdomen is soft.     Tenderness: There is no abdominal tenderness. There is no guarding.  Musculoskeletal:        General: No edema.     Cervical back: Normal range of motion and neck supple.  Skin:    General: Skin is warm.     Findings: No rash.  Neurological:     Mental Status: He is alert and oriented to person, place, and time.  Psychiatric:        Mood and Affect: Mood is anxious.     ED Results / Procedures / Treatments   Labs (all labs ordered are listed, but only abnormal results are displayed) Labs Reviewed  BASIC METABOLIC PANEL - Abnormal; Notable for the following components:      Result Value   Glucose, Bld 128 (*)    BUN 30 (*)    Creatinine, Ser 1.52 (*)    Calcium 8.6 (*)    GFR, Estimated 56 (*)    All other  components within normal limits  CBC WITH DIFFERENTIAL/PLATELET - Abnormal; Notable for the following components:   WBC 18.5 (*)    RBC 4.07 (*)    Hemoglobin 11.3 (*)    HCT 35.8 (*)    RDW 17.2 (*)    Neutro Abs 16.1 (*)    Lymphs Abs 0.6 (*)    Monocytes Absolute 1.5 (*)    Abs Immature Granulocytes 0.11 (*)    All other components within normal limits  BRAIN NATRIURETIC  PEPTIDE - Abnormal; Notable for the following components:   B Natriuretic Peptide 305.6 (*)    All other components within normal limits  RESP PANEL BY RT-PCR (FLU A&B, COVID) ARPGX2  CULTURE, BLOOD (ROUTINE X 2)  CULTURE, BLOOD (ROUTINE X 2)  POC SARS CORONAVIRUS 2 AG -  ED    EKG EKG Interpretation  Date/Time:  Monday February 24 2021 09:48:19 EST Ventricular Rate:  114 PR Interval:  148 QRS Duration: 72 QT Interval:  364 QTC Calculation: 501 R Axis:   77 Text Interpretation: Sinus tachycardia Otherwise normal ECG Confirmed by Elnora Morrison [phone]) on 02/24/2021 9:57:18 AM   Radiology CT Angio Chest PE W and/or Wo Contrast  Result Date: 02/24/2021 CLINICAL DATA:  Shortness of breath, history of recent right shoulder surgery EXAM: CT ANGIOGRAPHY CHEST WITH CONTRAST TECHNIQUE: Multidetector CT imaging of the chest was performed using the standard protocol during bolus administration of intravenous contrast. Multiplanar CT image reconstructions and MIPs were obtained to evaluate the vascular anatomy. CONTRAST:  163mL OMNIPAQUE IOHEXOL 350 MG/ML SOLN COMPARISON:  Chest x-ray from earlier in the same day, 01/06/2021. FINDINGS: Cardiovascular: Thoracic aorta demonstrates mild atherosclerotic calcifications without aneurysmal dilatation or dissection. Mild coronary calcifications are seen. No cardiac enlargement is noted. The pulmonary artery is well visualized and shows a normal branching pattern bilaterally. No filling defects to suggest pulmonary emboli are noted. Mediastinum/Nodes: Thoracic inlet is within  normal limits. No sizable hilar or mediastinal adenopathy is noted. The esophagus as visualized is within normal limits. Lungs/Pleura: Left lung is well aerated with some patchy ground-glass opacities in the upper lobe which may represent atypical pneumonia. The right lung also demonstrates some patchy ground-glass opacities as well as multifocal areas of pleural based fluid density this specially with the major fissure. Some associated air is noted suggestive of focal cavitary lesions within the superior segment of the right lower lobe best seen on image number 54 of series 7 as well as more inferiorly and laterally on image number 75 of series 7. These changes were not present on the prior plain film examination from January 2022. Upper Abdomen: Visualized upper abdomen shows no acute abnormality. Musculoskeletal: Postsurgical changes are noted in the proximal right humerus consistent with the known history of shoulder replacement. Air-fluid collection is noted upper right arm extending into the chest wall along the pectoralis muscle suggestive of postoperative abscess. The air-fluid collection measures approximately 9.7 x 4.7 cm Review of the MIP images confirms the above findings. IMPRESSION: No evidence of pulmonary emboli. Multifocal areas of fluid within the right pleural space and fissures with some associated areas of cavitation as well as multifocal parenchymal inflammatory change. Changes were not present in January of 2022 and given their abrupt onset are consistent with multifocal infection/pneumonia. Postsurgical changes in the proximal right humerus with associated subcutaneous air fluid collection highly suspicious for postoperative abscess along the anterior aspect of the upper right arm and right pectoralis muscles. Aortic Atherosclerosis (ICD10-I70.0). Electronically Signed   By: Inez Catalina M.D.   On: 02/24/2021 13:08   DG Chest Portable 1 View  Result Date: 02/24/2021 CLINICAL DATA:   Shortness of breath. Right shoulder swelling. Leukocytosis. EXAM: PORTABLE CHEST 1 VIEW COMPARISON:  X-ray 01/06/2021 FINDINGS: Stable cardiomediastinal contours. Increased interstitial markings within the right perihilar and right basilar regions. Ovoid opacity within the peripheral aspect of the right mid lung, potentially representing pleural fluid within the minor fissure. Linear right basilar opacities. Interval postsurgical changes from right shoulder arthroplasty. There  is air projecting within the soft tissues overlying the right shoulder, which may be postsurgical given recent surgery. IMPRESSION: 1. Increased interstitial markings within the right perihilar and right basilar regions, could represent asymmetric edema versus atypical/viral infection. 2. Ovoid opacity within the peripheral aspect of the right mid lung, potentially representing pleural fluid within the minor fissure. 3. Postsurgical changes from right shoulder arthroplasty. Air projects within the soft tissues overlying the right shoulder, which may be related to recent postsurgical change. Superimposed infection would be difficult to exclude radiographically. Electronically Signed   By: Davina Poke D.O.   On: 02/24/2021 11:27    Procedures .Critical Care Performed by: Elnora Morrison, MD Authorized by: Elnora Morrison, MD   Critical care provider statement:    Critical care time (minutes):  40   Critical care start time:  02/24/2021 2:00 PM   Critical care end time:  02/24/2021 2:40 PM   Critical care time was exclusive of:  Separately billable procedures and treating other patients and teaching time   Critical care was necessary to treat or prevent imminent or life-threatening deterioration of the following conditions:  Respiratory failure   Critical care was time spent personally by me on the following activities:  Discussions with consultants, evaluation of patient's response to treatment, examination of patient, ordering  and performing treatments and interventions, ordering and review of laboratory studies, ordering and review of radiographic studies, pulse oximetry, re-evaluation of patient's condition, obtaining history from patient or surrogate and review of old charts     Medications Ordered in ED Medications  clindamycin (CLEOCIN) IVPB 600 mg (has no administration in time range)  albuterol (PROVENTIL,VENTOLIN) solution continuous neb (20 mg/hr Nebulization Given 02/24/21 1102)  methylPREDNISolone sodium succinate (SOLU-MEDROL) 125 mg/2 mL injection 125 mg (125 mg Intravenous Given 02/24/21 1039)  sodium chloride 0.9 % bolus 500 mL (0 mLs Intravenous Stopped 02/24/21 1618)  ondansetron (ZOFRAN) injection 4 mg (4 mg Intravenous Given 02/24/21 1212)  iohexol (OMNIPAQUE) 350 MG/ML injection 100 mL (100 mLs Intravenous Contrast Given 02/24/21 1243)    ED Course  I have reviewed the triage vital signs and the nursing notes.  Pertinent labs & imaging results that were available during my care of the patient were reviewed by me and considered in my medical decision making (see chart for details).    MDM Rules/Calculators/A&P                          Patient presents with clinical concern for acute COPD exacerbation versus pneumonia versus blood clot with hypoxia 80% requiring 1 to 2 L nasal cannula.  With recent surgery CT angiogram ordered and reviewed no pulmonary emboli seen, inflammation reported by radiology and discussed this with admitting team to ensure good follow-up.  Clinical concern for postop infection right upper arm, discussed with orthopedics who evaluated in the ED and recommended Cipro, blood cultures and admission.  They will consult.  Blood work ordered and reviewed showing white blood cell count 18.5 with a shift, hemoglobin 1.8.  Patient received continuous nebulizer and steroids for COPD component.  Discussed with admitting team for admission.  Ernest Reese was evaluated in Emergency  Department on 02/24/2021 for the symptoms described in the history of present illness. He was evaluated in the context of the global COVID-19 pandemic, which necessitated consideration that the patient might be at risk for infection with the SARS-CoV-2 virus that causes COVID-19. Institutional protocols and algorithms that pertain to the  evaluation of patients at risk for COVID-19 are in a state of rapid change based on information released by regulatory bodies including the CDC and federal and state organizations. These policies and algorithms were followed during the patient's care in the ED.  Final Clinical Impression(s) / ED Diagnoses Final diagnoses:  Acute exacerbation of chronic obstructive pulmonary disease (COPD) ([place])  Right arm cellulitis  Infection of superficial incisional surgical site after procedure, initial encounter    Rx / DC Orders ED Discharge Orders    None       Elnora Morrison, MD 02/24/21 1623

## 2021-02-24 NOTE — H&P (View-Only) (Signed)
Reason for Consult:s/p TSA Referring Physician: Rosalyn Gess Time called: 2536 Time at bedside: [place] is an 50 y.o. male.  HPI: Ernest Reese comes to the ED mainly for SOB but has also been having significant right shoulder pain. He underwent reverse total shoulder by Dr. Stann Mainland on 2/15 and noticed increased pain Thursday and Friday of last week. He denies fevers, chills, sweats, N/V or discharge from the wound.  Past Medical History:  Diagnosis Date  . Emphysema lung ([place])   . Testicular cancer [facility])     Past Surgical History:  Procedure Laterality Date  . HAND SURGERY    . NECK SURGERY    . REPLACEMENT TOTAL KNEE    . testicle removed    . TOTAL SHOULDER ARTHROPLASTY Right 02/11/2021   Procedure: RIGHT SHOULDER HEMI-ARTHROPLASTY  ;  Surgeon: Nicholes Stairs, MD;  Location: [place];  Service: Orthopedics;  Laterality: Right;  2.5 HRS    Family History  Problem Relation Age of Onset  . Cancer Father     Social History:  reports that he has been smoking cigarettes. He has been smoking about 1.00 pack per day. He has never used smokeless tobacco. He reports previous alcohol use. He reports previous drug use.  Allergies: No Known Allergies  Medications: I have reviewed the patient's current medications.  No results found for this or any previous visit (from the past 48 hour(s)).  No results found.  Review of Systems  Constitutional: Negative for chills, diaphoresis and fever.  HENT: Negative for ear discharge, ear pain, hearing loss and tinnitus.   Eyes: Negative for photophobia and pain.  Respiratory: Positive for shortness of breath. Negative for cough.   Cardiovascular: Negative for chest pain.  Gastrointestinal: Negative for abdominal pain, nausea and vomiting.  Genitourinary: Negative for dysuria, flank pain, frequency and urgency.  Musculoskeletal: Positive for arthralgias (Right shoulder). Negative for back pain, myalgias and neck pain.  Neurological:  Negative for dizziness and headaches.  Hematological: Does not bruise/bleed easily.  Psychiatric/Behavioral: The patient is not nervous/anxious.    Blood pressure 133/70, pulse (!) 150, temperature 98.2 F (36.8 C), temperature source Oral, resp. rate (!) 22, SpO2 91 %. Physical Exam Constitutional:      General: He is not in acute distress.    Appearance: He is well-developed and well-nourished. He is not diaphoretic.  HENT:     Head: Normocephalic and atraumatic.  Eyes:     General: No scleral icterus.       Right eye: No discharge.        Left eye: No discharge.     Conjunctiva/sclera: Conjunctivae normal.  Cardiovascular:     Rate and Rhythm: Normal rate and regular rhythm.  Pulmonary:     Effort: Pulmonary effort is normal. No respiratory distress.  Musculoskeletal:     Cervical back: Normal range of motion.     Comments: Right shoulder, elbow, wrist, digits- Shoulder incision C/D/I but surrounding erythema and some induration inferiorly, mod TTP, no instability, no blocks to motion  Sens  Ax/U intact, R/M paresthetic  Mot   Ax/ R/ PIN/ M/ AIN/ U intact  Rad 2+  Skin:    General: Skin is warm and dry.  Neurological:     Mental Status: He is alert.  Psychiatric:        Mood and Affect: Mood and affect normal.        Behavior: Behavior normal.     Assessment/Plan: Right shoulder surgical site  infection -- Recommend IV clindamycin. F/u with Dr. Stann Mainland on discharge. Sling, NWB RUE.    Lisette Abu, PA-C Orthopedic Surgery [phone] 02/24/2021, 11:00 AM

## 2021-02-24 NOTE — ED Notes (Signed)
Dinner Tray Ordered @ [phone].

## 2021-02-24 NOTE — Plan of Care (Signed)
  Problem: Clinical Measurements: Goal: Will remain free from infection Outcome: Progressing   Problem: Pain Managment: Goal: General experience of comfort will improve Outcome: Progressing   

## 2021-02-25 ENCOUNTER — Observation Stay (HOSPITAL_COMMUNITY): Payer: Medicare HMO

## 2021-02-25 ENCOUNTER — Encounter (HOSPITAL_COMMUNITY): Payer: Self-pay | Admitting: Family Medicine

## 2021-02-25 DIAGNOSIS — Z96611 Presence of right artificial shoulder joint: Secondary | ICD-10-CM | POA: Diagnosis not present

## 2021-02-25 DIAGNOSIS — Z5329 Procedure and treatment not carried out because of patient's decision for other reasons: Secondary | ICD-10-CM

## 2021-02-25 DIAGNOSIS — I38 Endocarditis, valve unspecified: Secondary | ICD-10-CM | POA: Diagnosis present

## 2021-02-25 DIAGNOSIS — Z720 Tobacco use: Secondary | ICD-10-CM | POA: Diagnosis present

## 2021-02-25 DIAGNOSIS — J984 Other disorders of lung: Secondary | ICD-10-CM

## 2021-02-25 DIAGNOSIS — B9561 Methicillin susceptible Staphylococcus aureus infection as the cause of diseases classified elsewhere: Secondary | ICD-10-CM | POA: Diagnosis not present

## 2021-02-25 DIAGNOSIS — M00011 Staphylococcal arthritis, right shoulder: Secondary | ICD-10-CM | POA: Diagnosis not present

## 2021-02-25 DIAGNOSIS — R918 Other nonspecific abnormal finding of lung field: Secondary | ICD-10-CM | POA: Diagnosis not present

## 2021-02-25 DIAGNOSIS — J439 Emphysema, unspecified: Secondary | ICD-10-CM | POA: Diagnosis present

## 2021-02-25 DIAGNOSIS — I351 Nonrheumatic aortic (valve) insufficiency: Secondary | ICD-10-CM

## 2021-02-25 DIAGNOSIS — J441 Chronic obstructive pulmonary disease with (acute) exacerbation: Secondary | ICD-10-CM | POA: Diagnosis not present

## 2021-02-25 DIAGNOSIS — B1920 Unspecified viral hepatitis C without hepatic coma: Secondary | ICD-10-CM | POA: Diagnosis present

## 2021-02-25 DIAGNOSIS — G4700 Insomnia, unspecified: Secondary | ICD-10-CM | POA: Diagnosis not present

## 2021-02-25 DIAGNOSIS — N179 Acute kidney failure, unspecified: Secondary | ICD-10-CM | POA: Diagnosis present

## 2021-02-25 DIAGNOSIS — R7881 Bacteremia: Secondary | ICD-10-CM | POA: Diagnosis not present

## 2021-02-25 DIAGNOSIS — Z8619 Personal history of other infectious and parasitic diseases: Secondary | ICD-10-CM | POA: Diagnosis not present

## 2021-02-25 DIAGNOSIS — N289 Disorder of kidney and ureter, unspecified: Secondary | ICD-10-CM | POA: Diagnosis not present

## 2021-02-25 DIAGNOSIS — J949 Pleural condition, unspecified: Secondary | ICD-10-CM | POA: Diagnosis present

## 2021-02-25 DIAGNOSIS — J869 Pyothorax without fistula: Secondary | ICD-10-CM | POA: Diagnosis present

## 2021-02-25 DIAGNOSIS — M009 Pyogenic arthritis, unspecified: Secondary | ICD-10-CM | POA: Diagnosis present

## 2021-02-25 DIAGNOSIS — Z20822 Contact with and (suspected) exposure to covid-19: Secondary | ICD-10-CM | POA: Diagnosis present

## 2021-02-25 DIAGNOSIS — L02413 Cutaneous abscess of right upper limb: Secondary | ICD-10-CM | POA: Diagnosis present

## 2021-02-25 DIAGNOSIS — A419 Sepsis, unspecified organism: Secondary | ICD-10-CM | POA: Diagnosis present

## 2021-02-25 DIAGNOSIS — I2690 Septic pulmonary embolism without acute cor pulmonale: Secondary | ICD-10-CM | POA: Diagnosis present

## 2021-02-25 DIAGNOSIS — A4101 Sepsis due to Methicillin susceptible Staphylococcus aureus: Secondary | ICD-10-CM | POA: Diagnosis not present

## 2021-02-25 DIAGNOSIS — M542 Cervicalgia: Secondary | ICD-10-CM | POA: Diagnosis present

## 2021-02-25 DIAGNOSIS — I700 Atherosclerosis of aorta: Secondary | ICD-10-CM | POA: Diagnosis present

## 2021-02-25 DIAGNOSIS — J15211 Pneumonia due to Methicillin susceptible Staphylococcus aureus: Secondary | ICD-10-CM | POA: Diagnosis present

## 2021-02-25 DIAGNOSIS — L03113 Cellulitis of right upper limb: Secondary | ICD-10-CM | POA: Diagnosis present

## 2021-02-25 DIAGNOSIS — B9562 Methicillin resistant Staphylococcus aureus infection as the cause of diseases classified elsewhere: Secondary | ICD-10-CM | POA: Diagnosis not present

## 2021-02-25 DIAGNOSIS — T8149XA Infection following a procedure, other surgical site, initial encounter: Secondary | ICD-10-CM

## 2021-02-25 DIAGNOSIS — T8141XA Infection following a procedure, superficial incisional surgical site, initial encounter: Secondary | ICD-10-CM | POA: Diagnosis present

## 2021-02-25 DIAGNOSIS — R0600 Dyspnea, unspecified: Secondary | ICD-10-CM | POA: Diagnosis not present

## 2021-02-25 DIAGNOSIS — G8929 Other chronic pain: Secondary | ICD-10-CM | POA: Diagnosis present

## 2021-02-25 DIAGNOSIS — J9601 Acute respiratory failure with hypoxia: Secondary | ICD-10-CM | POA: Diagnosis present

## 2021-02-25 DIAGNOSIS — F1910 Other psychoactive substance abuse, uncomplicated: Secondary | ICD-10-CM | POA: Diagnosis present

## 2021-02-25 DIAGNOSIS — K5900 Constipation, unspecified: Secondary | ICD-10-CM | POA: Diagnosis present

## 2021-02-25 DIAGNOSIS — E876 Hypokalemia: Secondary | ICD-10-CM | POA: Diagnosis not present

## 2021-02-25 DIAGNOSIS — D500 Iron deficiency anemia secondary to blood loss (chronic): Secondary | ICD-10-CM | POA: Diagnosis present

## 2021-02-25 DIAGNOSIS — T8141XD Infection following a procedure, superficial incisional surgical site, subsequent encounter: Secondary | ICD-10-CM | POA: Diagnosis not present

## 2021-02-25 DIAGNOSIS — F419 Anxiety disorder, unspecified: Secondary | ICD-10-CM | POA: Diagnosis not present

## 2021-02-25 HISTORY — DX: Atherosclerosis of aorta: I70.0

## 2021-02-25 LAB — BASIC METABOLIC PANEL
Anion gap: 11 (ref 5–15)
BUN: 30 mg/dL — ABNORMAL HIGH (ref 6–20)
CO2: 30 mmol/L (ref 22–32)
Calcium: 8.8 mg/dL — ABNORMAL LOW (ref 8.9–10.3)
Chloride: 102 mmol/L (ref 98–111)
Creatinine, Ser: 1.57 mg/dL — ABNORMAL HIGH (ref 0.61–1.24)
GFR, Estimated: 54 mL/min — ABNORMAL LOW (ref >60)
Glucose, Bld: 230 mg/dL — ABNORMAL HIGH (ref 70–99)
Potassium: 3.8 mmol/L (ref 3.5–5.1)
Sodium: 143 mmol/L (ref 135–145)

## 2021-02-25 LAB — CBC
HCT: 31.7 % — ABNORMAL LOW (ref 39.0–52.0)
Hemoglobin: 10.2 g/dL — ABNORMAL LOW (ref 13.0–17.0)
MCH: 28.2 pg (ref 26.0–34.0)
MCHC: 32.2 g/dL (ref 30.0–36.0)
MCV: 87.6 fL (ref 80.0–100.0)
Platelets: 215 10*3/uL (ref 150–400)
RBC: 3.62 MIL/uL — ABNORMAL LOW (ref 4.22–5.81)
RDW: 17.5 % — ABNORMAL HIGH (ref 11.5–15.5)
WBC: 11.8 10*3/uL — ABNORMAL HIGH (ref 4.0–10.5)
nRBC: 0 % (ref 0.0–0.2)

## 2021-02-25 LAB — ECHOCARDIOGRAM COMPLETE
AR max vel: 3.49 cm2
AV Area VTI: 3.19 cm2
AV Area mean vel: 3.03 cm2
AV Mean grad: 5 mmHg
AV Peak grad: 11.4 mmHg
Ao pk vel: 1.69 m/s
Area-P 1/2: 3.91 cm2
Height: 72 in
MV VTI: 2.99 cm2
P 1/2 time: 399 msec
S' Lateral: 3.3 cm
Weight: 2881.85 oz

## 2021-02-25 LAB — RAPID URINE DRUG SCREEN, HOSP PERFORMED
Amphetamines: NOT DETECTED
Barbiturates: NOT DETECTED
Benzodiazepines: NOT DETECTED
Cocaine: NOT DETECTED
Opiates: NOT DETECTED
Tetrahydrocannabinol: NOT DETECTED

## 2021-02-25 LAB — HIV ANTIBODY (ROUTINE TESTING W REFLEX): HIV Screen 4th Generation wRfx: NONREACTIVE

## 2021-02-25 MED ORDER — GUAIFENESIN ER 600 MG PO TB12
1200.0000 mg | ORAL_TABLET | Freq: Every day | ORAL | Status: DC
  Administered 2021-02-25 – 2021-02-26 (×2): 1200 mg via ORAL
  Filled 2021-02-25 (×2): qty 2

## 2021-02-25 MED ORDER — FLUTICASONE FUROATE-VILANTEROL 100-25 MCG/INH IN AEPB
1.0000 | INHALATION_SPRAY | Freq: Every day | RESPIRATORY_TRACT | Status: DC
  Administered 2021-02-26 – 2021-02-28 (×3): 1 via RESPIRATORY_TRACT
  Filled 2021-02-25: qty 28

## 2021-02-25 MED ORDER — ACETAMINOPHEN 500 MG PO TABS
1000.0000 mg | ORAL_TABLET | Freq: Four times a day (QID) | ORAL | Status: DC
  Administered 2021-02-25: 1000 mg via ORAL
  Filled 2021-02-25: qty 2

## 2021-02-25 MED ORDER — ACETAMINOPHEN 500 MG PO TABS
1000.0000 mg | ORAL_TABLET | Freq: Four times a day (QID) | ORAL | Status: DC
Start: 2021-02-25 — End: 2021-03-03
  Administered 2021-02-25 – 2021-03-02 (×16): 1000 mg via ORAL
  Filled 2021-02-25 (×21): qty 2

## 2021-02-25 MED ORDER — GUAIFENESIN-DM 100-10 MG/5ML PO SYRP
5.0000 mL | ORAL_SOLUTION | ORAL | Status: DC | PRN
  Administered 2021-02-25: 02:00:00 5 mL via ORAL
  Filled 2021-02-25: qty 5

## 2021-02-25 MED ORDER — IBUPROFEN 400 MG PO TABS
400.0000 mg | ORAL_TABLET | Freq: Once | ORAL | Status: AC
  Administered 2021-02-25: 400 mg via ORAL
  Filled 2021-02-25: qty 1

## 2021-02-25 MED ORDER — CETAPHIL MOISTURIZING EX LOTN
TOPICAL_LOTION | CUTANEOUS | Status: DC | PRN
  Filled 2021-02-25: qty 473
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
## 2021-02-25 NOTE — Progress Notes (Signed)
Occupational Therapy Evaluation Patient Details Name: Ernest Reese MRN: [id number] DOB: [birth year] Today's Date: 02/25/2021    History of Present Illness Pt is a 50 y.o. male admitted 02/24/21 with SOB and shoulder pain; pt s/p R shoulder hemi-arthroplasty (02/11/21 with Dr. Stann Mainland), now with surgical site cellulitis. PMH includes COPD, anemia, testicular CA, tobacco use.   Clinical Impression   At baseline pt independent. S/p R hemiarthroplasty and does not appear to be following precautions as pt had sling off, leaning forward on operated arm on entry. Will need clarification orders regarding allowable ROM for R shoulder - PA messaged. At this time requires min A for UB ADL tasks. Pt asking this therapist why "cow parts" were put in his shoulder. Pt was shown his x-ray s/p surgery to educate pt on the placement of the prosthetic in his humerus for a "hemiarthroplasty". Will follow acutely to facilitate safe DC home. Girlfriend present and states she can assist as needed after DC.     Follow Up Recommendations  Supervision - Intermittent;Other (comment);Follow surgeon's recommendation for DC plan and follow-up therapies (outpt therapy when appropriate)    Equipment Recommendations  None recommended by OT    Recommendations for Other Services       Precautions / Restrictions Precautions Precautions: Fall;Other (comment) Precaution Comments: Watch SpO2- needing 6L O2 (R hemishoulder arthroplasty) Required Braces or Orthoses: Sling Restrictions Weight Bearing Restrictions: Yes RUE Weight Bearing: Non weight bearing      Mobility Bed Mobility Overal bed mobility: Modified Independent                  Transfers Overall transfer level: Modified independent                    Balance Overall balance assessment: No apparent balance deficits (not formally assessed)                                         ADL either performed or assessed with clinical  judgement   ADL Overall ADL's : Needs assistance/impaired Eating/Feeding: Set up   Grooming: Set up;Supervision/safety   Upper Body Bathing: Minimal assistance;Sitting   Lower Body Bathing: Set up;Supervison/ safety;Sit to/from stand   Upper Body Dressing : Minimal assistance;Sitting   Lower Body Dressing: Set up;Supervision/safety;Sit to/from stand   Toilet Transfer: Modified Independent   Toileting- Clothing Manipulation and Hygiene: Modified independent       Functional mobility during ADLs: Modified independent General ADL Comments: Pt using RUE however advised to not use RUE for ADL tasks until clarification recieved. Pt not wearing sling on arm, only circled around his neck; educated on need to keep pillow behind shoulder     Vision         Perception     Praxis      Pertinent Vitals/Pain Pain Assessment: 0-10 Pain Score: 7  Pain Location: R shoulder incision Pain Descriptors / Indicators: Grimacing;Guarding Pain Intervention(s): Limited activity within patient's tolerance;Repositioned (placed in sling)     Hand Dominance Right (also L)   Extremity/Trunk Assessment Upper Extremity Assessment Upper Extremity Assessment: RUE deficits/detail RUE Deficits / Details: elbow/wrist/hand AROM WFL; unsure of allowable shoulder ROM - messaged PA for clarification RUE Coordination: decreased gross motor   Lower Extremity Assessment Lower Extremity Assessment: Defer to PT evaluation   Cervical / Trunk Assessment Cervical / Trunk Assessment: Normal   Communication Communication Communication:  No difficulties   Cognition Arousal/Alertness: Awake/alert Behavior During Therapy: Impulsive Overall Cognitive Status:  (most likely at baseline with poor safety awareness/judegement/reasoning/insight)                                     General Comments       Exercises     Shoulder Instructions      Home Living Family/patient expects to be  discharged to:: Private residence Living Arrangements: Other (Comment) Available Help at Discharge: Friend(s) Type of Home: House Home Access: Stairs to enter Technical brewer of Steps: 1+1 Entrance Stairs-Rails: Right Home Layout: One level     Bathroom Shower/Tub: Teacher, early years/pre: Standard Bathroom Accessibility: Yes How Accessible: Accessible via walker Home Equipment: None          Prior Functioning/Environment Level of Independence: Independent        Comments: Pt has been using RUE since sx, sometimes using sling, reports he was not aware of any NWB precautions/ROM restrictions/therex. Sits for bathing then stands to shower off ("has not been getting incision wet"). Driving and using RUE to change gears in truck        OT Problem List: Decreased strength;Decreased range of motion;Decreased activity tolerance;Decreased coordination;Decreased safety awareness;Decreased knowledge of use of DME or AE;Decreased knowledge of precautions;Impaired UE functional use;Pain;Increased edema      OT Treatment/Interventions: Self-care/ADL training;Therapeutic exercise;Neuromuscular education;DME and/or AE instruction;Therapeutic activities;Patient/family education    OT Goals(Current goals can be found in the care plan section) Acute Rehab OT Goals Patient Stated Goal: to find out how I got this infection OT Goal Formulation: With patient Time For Goal Achievement: 03/11/21 Potential to Achieve Goals: Good  OT Frequency: Min 2X/week   Barriers to D/C:            Co-evaluation              AM-PAC OT "6 Clicks" Daily Activity     Outcome Measure Help from another person eating meals?: A Little Help from another person taking care of personal grooming?: A Little Help from another person toileting, which includes using toliet, bedpan, or urinal?: A Little Help from another person bathing (including washing, rinsing, drying)?: A Little Help from  another person to put on and taking off regular upper body clothing?: A Little Help from another person to put on and taking off regular lower body clothing?: A Little 6 Click Score: 18   End of Session Equipment Utilized During Treatment: Oxygen Nurse Communication: Mobility status  Activity Tolerance: Patient tolerated treatment well Patient left: in bed;with call bell/phone within reach;with family/visitor present  OT Visit Diagnosis: Other abnormalities of gait and mobility (R26.89);Muscle weakness (generalized) (M62.81);Pain Pain - Right/Left: Right Pain - part of body: Shoulder                Time: 1027-2536 OT Time Calculation (min): 24 min Charges:  OT General Charges $OT Visit: 1 Visit OT Evaluation $OT Eval Moderate Complexity: 1 Mod OT Treatments $Self Care/Home Management : 8-22 mins  Maurie Boettcher, OT/L   Acute OT Clinical Specialist [place] Pager [phone] Office [phone]   [facility] 02/25/2021, 4:07 PM

## 2021-02-25 NOTE — Progress Notes (Signed)
  Echocardiogram 2D Echocardiogram has been performed.  Ernest Reese 02/25/2021, 3:59 PM

## 2021-02-25 NOTE — Progress Notes (Signed)
SATURATION QUALIFICATIONS: (This note is used to comply with regulatory documentation for home oxygen)  Patient Saturations on Room Air at Rest = 83%  Patient Saturations on Room Air while Ambulating = NT due to drop at rest  Patient Saturations on 6 Liters of oxygen while Ambulating = 87%  Please briefly explain why patient needs home oxygen: Patient requires supplemental oxygen to maintain saturations at rest and with activity.  Mabeline Caras, PT, DPT Acute Rehabilitation Services  Pager [phone] Office [phone]

## 2021-02-25 NOTE — Progress Notes (Signed)
Family Medicine Teaching Service Daily Progress Note Intern Pager: [phone]  Patient name: Ernest Reese Medical record number: [id number] Date of birth: 06-05-1971 Age: 50 y.o. Gender: male  Primary Care Provider: Patient, No Pcp Per Consultants: Ortho, pulm Code Status: Full  Pt Overview and Major Events to Date:  2/28 Admitted   Assessment and Plan: Ernest Reese is a 50 y.o. male presenting with SOB and shoulder pain, s/p arthroplasty 2 weeks ago. PMH is significant for COPD, testicular cancer, anemia, tobacco use, [name]   SOB likely multifactorial 2/2 multifocal pneumonia, COPD exacerbation Patient presents with shortness of breath for the past week. Reports using  his albuterol daily for the past week. Currently on 6L [place]. CXR showed increased interstitial markings within the right perihilar and right basilar regions, and ovoid opacity within the peripheral aspect of the right mid lung, potentially representing pleural fluid within the minor fissure. Air projects within the soft tissues overlying the right shoulder, which may be related to recent postsurgical change. CTA negative for pulmonary emboli and showed multifocal areas of fluid within the right pleural space and fissures with some associated areas of cavitation as well as multifocal parenchymal inflammatory change. Changes were not present in January of 2022 and given their abrupt onset are consistent with multifocal infection/pneumonia. Recent MRSA swab positive on 2/11.  On exam, patient with extensive wheezing in all lung fields.  Differentials include MRSA pneumonia s/p R shoulder arthroplasty and recent surgical infection, as well as septic emboli given his history of IVDU. - ortho consulted, appreciate recommendations  - pulm consulted, appreciate recommendations  - albuterol inhaler as needed - Breo ellipta 1 puff daily  - f/u blood culture - Con't Vanc and Zosyn. De-escalate as appropriate  - echo  - consider  steroids in am  - am BMP, CBC  Arthroplasty of left shoulder  Shoulder surgical site infection  Surgery 2 weeks ago.  Patient presents with cellulitis of postop wound. Ortho following, appreciate continued involvement.  Given new onset hypoxia, tachypnea and tachycardia, concern for MRSA PNA is high on differential.  - Cont Vanc and Zosyn  - f/u with Dr. Stann Mainland outpatient   Renal Insufficiency Cr 1.52 on admission. Previous admissions have ranged from 1.14-8.59  - am BMP - avoid nephro toxic agents - N/S at 100/hr  Normocytic Anemia  Hgb 10.2 down from 11.3. Baseline appears to be about 12-14.  No overt signs of bleeding. Likely due to infection. Normotensive. - consider iron studies  - am CBC -monitor for signs of bleeding  Tobacco Use  Reports 1 pack/day for his "whole life"  - Nicotine patch  - encourage smoking cessation  Previous heroin use  Endorses being off of heroin for the past year.  Reports taking methadone 100 mg every morning at 6 AM from the [facility].  States if he does not have this that he has pretty significant withdrawal - PDMP reviewed - Methadone 100 mg daily  FEN/GI: Regular diet Prophylaxis: Lovenox  Disposition: Med-tele   Subjective:  No acute events overnight. Patient frustrated that he could not walk around. Also endorses dry and itchy skin of his lower right leg   Objective: Temp:  [97.8 F (36.6 C)-98.8 F (37.1 C)] 98 F (36.7 C) (03/01 0440) Pulse Rate:  [88-150] 88 (03/01 0440) Resp:  [6-22] 14 (03/01 0440) BP: (111-147)/(60-95) 115/72 (03/01 0440) SpO2:  [88 %-95 %] 94 % (03/01 0440) Weight:  [81.7 kg] 81.7 kg (02/28 1836) Physical Exam:  General: alert, anxious, NAD Cardiovascular: RRR no murmurs Respiratory: wheezing in al lung fields. On 6L [place] Abdomen: soft, non distended non tender  Extremities: warm, dry. No LE edema   Laboratory: Recent Labs  Lab 02/24/21 1035 02/25/21 0041  WBC 18.5* 11.8*  HGB 11.3* 10.2*   HCT 35.8* 31.7*  PLT 243 215   Recent Labs  Lab 02/24/21 1035 02/25/21 0041  NA 140 143  K 3.6 3.8  CL 99 102  CO2 27 30  BUN 30* 30*  CREATININE 1.52* 1.57*  CALCIUM 8.6* 8.8*  GLUCOSE 128* 230*     Imaging/Diagnostic Tests: No results found.  Shary Key, DO 02/25/2021, 8:19 AM PGY-1, [place] Intern pager: [phone], text pages welcome

## 2021-02-25 NOTE — Procedures (Signed)
Echo attempted. Patient care in progress at time of attempt. Will attempt again later as time permits.

## 2021-02-25 NOTE — Progress Notes (Incomplete)
Pharmacy Student Note: For Learning Purposes ONLY. Not an active part of the chart.  Chief Complaint:  R shoulder pain and SOB of which had been increasing in past week.  PMH: COPD, tobacco abuse, previous IVDU, anemia, testicular cancer  PSH: shoulder arthroplasty 02/11/2021  Recommendations:  -Anoro -f/u TOC  Admit Complaint: shoulder pain and increased work of breathing  Problems: 1) cavitary multifocal PNA with abscess post surgery 01/2021.  A: abscess with fluid in joint. Pulm consulted who determined the shoulder had spread to the lung.     -blood cultures positive for Staphylococcus species.     -positive MRSA PCR, though dated in February, therefore likely decolonized       -ECHO normal.      -declined TEE  PTA meds:  P: Continuing cefazolin 2 g IV Q8 -Monitoring: WBC's worsening (16.1), afebrile     Worsening resp status 3/3, on 2L and more breathless.  -oritavancin x 1 today --> 4 weeks linezolid starting 3/14.  inpt meds: Discharge meds:  2) COPD and tobacco abuse.  A: COPD only on albuterol. Worsening SOB at home preceding dx. 45 pack-year smoking hx. Records indicate [facility] 10/2020. Dry mouth with Breo, patient wishes to avoid. P:  -Nicoderm patch while inpt -switch to Anoro. Rinse mouth and drink plenty of water to minimize dry mouth.  3) pain -A: Hx IVDU. Pain levels stable. P:  increased acetaminophen to 1 g Q6 (4g/24HRs). Monitoring: normal LFTs 02/07/2021 -Continue methadone and APAP. F/u MAT. Gabapentin  -Monitor QTc. Monitor for new QTc prolonging meds.  Holding meds:  Dimple Nanas PharmD Student

## 2021-02-25 NOTE — Evaluation (Signed)
Physical Therapy Evaluation Patient Details Name: Ernest Reese MRN: [id number] DOB: [birth year] Today's Date: 02/25/2021   History of Present Illness  Pt is a 50 y.o. male admitted 02/24/21 with SOB and shoulder pain; pt s/p R shoulder hemi-arthroplasty (02/11/21 with Dr. Stann Mainland), now with surgical site cellulitis. PMH includes COPD, anemia, testicular CA, tobacco use.    Clinical Impression  Pt presents with an overall decrease in functional mobility secondary to above. PTA, pt independent, drives, lives with roommate; pt has been functioning independently since recent R shoulder sx, but reports he has been using arm regularly (I.e. driving, ADLs) and was not educated on WB/ROM precautions or restrictions. Educ on RUE NWB precautions, sling wear, positioning. Today, pt able to perform ambulation and higher level balance tasks without instability. Pt required 6L O2 to maintain SpO2 >/87% with activity (see saturations qualifications note). Pt would benefit from continued acute PT services to maximize functional mobility and independence prior to d/c with outpatient ortho PT for post-op shoulder treatment.     Follow Up Recommendations Outpatient PT (OP ortho for post-op TSA)    Equipment Recommendations  None recommended by PT    Recommendations for Other Services       Precautions / Restrictions Precautions Precautions: Fall;Other (comment) Precaution Comments: Watch SpO2- needing 6L O2 Required Braces or Orthoses: Sling Restrictions Weight Bearing Restrictions: Yes RUE Weight Bearing: Non weight bearing      Mobility  Bed Mobility Overal bed mobility: Modified Independent             General bed mobility comments: Received sitting EOB    Transfers Overall transfer level: Needs assistance Equipment used: None Transfers: Sit to/from Stand Sit to Stand: Supervision         General transfer comment: Supervision for safety with lines  Ambulation/Gait Ambulation/Gait  assistance: Supervision Gait Distance (Feet): 240 Feet Assistive device: IV Pole;None Gait Pattern/deviations: Step-through pattern;Decreased stride length   Gait velocity interpretation: >2.62 ft/sec, indicative of community ambulatory General Gait Details: Fast, steady gait with and without pushing IV pole; pt stopping to talk to others often requiring direction to stay on task; no overt instability or LOB noted with higher level balance tasks during ambulation (bringing foot up to adjust sock multiple times, sudden stops, turning, head turns, backwards/sideways walking). Cues to relax RUE into sling  Stairs            Wheelchair Mobility    Modified Rankin (Stroke Patients Only)       Balance Overall balance assessment: Needs assistance Sitting-balance support: No upper extremity supported;Feet supported Sitting balance-Leahy Scale: Good     Standing balance support: No upper extremity supported;During functional activity Standing balance-Leahy Scale: Good               High level balance activites: Side stepping;Backward walking;Direction changes;Turns;Sudden stops;Head turns High Level Balance Comments: No instability with higher level balance tasks             Pertinent Vitals/Pain Pain Assessment: Faces Faces Pain Scale: Hurts little more Pain Location: R shoulder incision Pain Descriptors / Indicators: Grimacing;Guarding Pain Intervention(s): Limited activity within patient's tolerance;Monitored during session;Other (comment) (donned sling)    Home Living Family/patient expects to be discharged to:: Private residence Living Arrangements: Other (Comment) (roommate) Available Help at Discharge: Friend(s) Type of Home: House Home Access: Stairs to enter   Technical brewer of Steps: 1+1 Home Layout: One level Home Equipment: None Additional Comments: Has a roommate who's house he lives in, but  older man is not consistently there and not reliable  for help    Prior Function Level of Independence: Independent         Comments: Pt has been using RUE since sx, sometimes using sling, reports he was not aware of any NWB precautions/ROM restrictions/therex. Sits for bathing then stands to shower off ("has not been getting incision wet"). Driving and using RUE to change gears in truck     Hand Dominance   Dominant Hand: Right    Extremity/Trunk Assessment   Upper Extremity Assessment Upper Extremity Assessment: RUE deficits/detail RUE Deficits / Details: R shoulder incision swollen/red; observed wrist/elbow AROM WFL; pt encouraged to minimize movement of R shoulder as much as possible, donned sling for immobilization; pt required cues throughout session to relax/minimize scapular elevation    Lower Extremity Assessment Lower Extremity Assessment: Overall WFL for tasks assessed       Communication   Communication: No difficulties  Cognition Arousal/Alertness: Awake/alert Behavior During Therapy: WFL for tasks assessed/performed Overall Cognitive Status: No family/caregiver present to determine baseline cognitive functioning Area of Impairment: Attention;Following commands;Safety/judgement;Awareness                   Current Attention Level: Selective   Following Commands: Follows multi-step commands inconsistently Safety/Judgement: Decreased awareness of safety;Decreased awareness of deficits Awareness: Emergent   General Comments: Pt recognizes he has been agitated during hospital stay; pleasant and agreeable during our session, although easily distracted with decreased insight into deficits and safety awareness. Educ on NWB precautions, pt adamant he will continue driving and using his RUE for what he needs to      General Comments General comments (skin integrity, edema, etc.): Reviewed importance of RUE NWB precautions and wearing sling to encourage this and decrease shoulder AROM - pt reports understanding, but  still intends to use his R arm - instructed pt he should not be driving while recovering with precautions in place. SpO2 down to 83% on RA (replaced O2 [place] before allowing to drop more); maintaining >/87% on 6L O2 [place]    Exercises     Assessment/Plan    PT Assessment Patient needs continued PT services  PT Problem List Decreased strength;Decreased range of motion;Decreased balance;Decreased mobility;Decreased cognition;Decreased knowledge of precautions;Pain       PT Treatment Interventions DME instruction;Gait training;Stair training;Functional mobility training;Therapeutic activities;Therapeutic exercise;Balance training;Patient/family education    PT Goals (Current goals can be found in the Care Plan section)  Acute Rehab PT Goals Patient Stated Goal: I want to get out of here, but that's too much oxygen PT Goal Formulation: With patient Time For Goal Achievement: 03/11/21 Potential to Achieve Goals: Good    Frequency Min 3X/week   Barriers to discharge        Co-evaluation               AM-PAC PT "6 Clicks" Mobility  Outcome Measure Help needed turning from your back to your side while in a flat bed without using bedrails?: None Help needed moving from lying on your back to sitting on the side of a flat bed without using bedrails?: None Help needed moving to and from a bed to a chair (including a wheelchair)?: None Help needed standing up from a chair using your arms (e.g., wheelchair or bedside chair)?: A Little Help needed to walk in hospital room?: A Little Help needed climbing 3-5 steps with a railing? : A Little 6 Click Score: 21    End of Session  Activity Tolerance: Patient tolerated treatment well Patient left: in chair;with call bell/phone within reach;with chair alarm set Nurse Communication: Mobility status PT Visit Diagnosis: Other abnormalities of gait and mobility (R26.89);Pain Pain - Right/Left: Right Pain - part of body: Shoulder    Time:  8916-9450 PT Time Calculation (min) (ACUTE ONLY): 39 min   Charges:   PT Evaluation $PT Eval Moderate Complexity: 1 Mod PT Treatments $Gait Training: 8-22 mins $Self Care/Home Management: 8-22   Mabeline Caras, PT, DPT Acute Rehabilitation Services  Pager [phone] Office [place] 02/25/2021, 11:59 AM

## 2021-02-25 NOTE — Hospital Course (Addendum)
Ernest Reese is a 50 y.o. male presenting with SOB 2/2 multifocal PNA and shoulder pain, s/p arthroplasty 2 weeks ago. PMH is significant for COPD, testicular cancer, anemia, tobacco use, [name]   Multifocal pneumonia  COPD exacerbation  Pulmonary Septic Emboli In ED, hypoxic to 88%, placed on 2L [place]. Received continuous nebulizer, Clinda 600 mg, methylprednisolone and 1L NS bolus. Labs significant for WBC 18.8 with left shift, BNP elevated at 305.6, BUN 30, Cr 1.52 and Hgb 11.3. CXR with increased interstitial markings and ovoid opacity potentially representing pleural fluid within the minor fissure. CTA negative for PE but showed multifocal areas of fluid within right pleural space and fissures with associated areas of cavitation. Recent MRSA swab + on 2/11. Exam notable for extensive wheezing, tachycardia and hyopxemia. Patient was started on Duonebs q4h, continued on steroids, abx switched to vanc and zosyn until cultures returned as MSSA and patient de-escalated to Ancef. Repeat blood cultures showed no growth in 5 days. Sputum with yeast observed. Echo with LVEF 60-65%. Pulmonology and ID consulted and recommended repeat CT after 4-6 weeks of abx. Patient was recommended to undergo TEE given his septic emboli but he refused. He was ultimately treated as endocarditis with 6 weeks of abx. He received 1200mg   Orbactiv on day of discharge (3/7) and was instructed to take Linezolid starting on 3/14 for 30 days.   Surgical site infection s/p left shoulder Arthroplasty/ Sepsis ruled out. Ortho consulted, recommended Clindamycin 600 mg TID. Given concern for MRSA PNA, patient was instead started on Vanc and Zosyn. Antibiotics switched once again to Ancef once culture showed MSSA.  I&D performed on 3/3 without complication and Hemovac drain was kept in place until discharge on 3/7 . Pain control post I&D with scheduled Acetaminophen, Ibuprofen and Tizanidine PRN (though did not require).   Given  history IVDU and neck tenderness as well, an MRI of c-spine was performed and showed no acute findings or evidence of discitis or osteomyelitis.   Previous Heroin Use Patient was continued on 100 mg of Methadone qd. He was instructed to return to MAT clinic following discharge for further medication.   Issues for follow-up: Ortho follow-up on the outpatient setting: Follow up with [facility] 1-2  weeks s/p I&D (week of March 14th or 21st) for wound check and re-evaluation with Dr. Stann Mainland.  Pulmonary team recommended 4-6 weeks of antibiotics for cavitary lung lesions  He will need repeat imaging after abx are complete to ensure resolution of cavitary lesions.  Patient to start zyvox pill on 3/14 for 4 weeks

## 2021-02-25 NOTE — Progress Notes (Signed)
   02/25/21 0224  Assess: MEWS Score  Temp 97.8 F (36.6 C)  BP 111/71  Pulse Rate 96  Resp 20  Level of Consciousness Alert  SpO2 92 %  O2 Device HFNC  O2 Flow Rate (L/min) 7 L/min  Assess: MEWS Score  MEWS Temp 0  MEWS Systolic 0  MEWS Pulse 0  MEWS RR 0  MEWS LOC 0  MEWS Score 0  MEWS Score Color Green  Assess: if the MEWS score is Yellow or Red  Were vital signs taken at a resting state? Yes  Focused Assessment No change from prior assessment  Early Detection of Sepsis Score *See Row Information* Medium  MEWS guidelines implemented *See Row Information* No, previously yellow, continue vital signs every 4 hours  Treat  MEWS Interventions Administered prn meds/treatments  Pain Scale 0-10  Pain Score 8  Pain Type Acute pain  Pain Location Shoulder  Pain Orientation Right  Escalate  MEWS: Escalate Yellow: discuss with charge nurse/RN and consider discussing with provider and RRT  Notify: Charge Nurse/RN  Name of Charge Nurse/RN Notified [place], RN  Date Charge Nurse/RN Notified 02/25/21  Time Charge Nurse/RN Notified 0230  Document  Patient Outcome Stabilized after interventions  Progress note created (see row info) Yes  Patient no longer Yellow MEWS due to two consecutive Green MEWS vital signs during Yellow MEWS protocol.

## 2021-02-25 NOTE — Procedures (Signed)
Echo attempted. Patient with RT, then physicians. Will attempt again later as time permits.

## 2021-02-26 ENCOUNTER — Other Ambulatory Visit (HOSPITAL_COMMUNITY): Payer: Medicare HMO

## 2021-02-26 ENCOUNTER — Inpatient Hospital Stay (HOSPITAL_COMMUNITY): Payer: Medicare HMO

## 2021-02-26 DIAGNOSIS — N289 Disorder of kidney and ureter, unspecified: Secondary | ICD-10-CM

## 2021-02-26 DIAGNOSIS — Z96611 Presence of right artificial shoulder joint: Secondary | ICD-10-CM

## 2021-02-26 DIAGNOSIS — J984 Other disorders of lung: Secondary | ICD-10-CM | POA: Diagnosis not present

## 2021-02-26 DIAGNOSIS — R918 Other nonspecific abnormal finding of lung field: Secondary | ICD-10-CM | POA: Diagnosis not present

## 2021-02-26 DIAGNOSIS — T8149XA Infection following a procedure, other surgical site, initial encounter: Secondary | ICD-10-CM | POA: Diagnosis not present

## 2021-02-26 DIAGNOSIS — J869 Pyothorax without fistula: Secondary | ICD-10-CM

## 2021-02-26 DIAGNOSIS — B9562 Methicillin resistant Staphylococcus aureus infection as the cause of diseases classified elsewhere: Secondary | ICD-10-CM

## 2021-02-26 DIAGNOSIS — R7881 Bacteremia: Secondary | ICD-10-CM

## 2021-02-26 DIAGNOSIS — A4101 Sepsis due to Methicillin susceptible Staphylococcus aureus: Secondary | ICD-10-CM | POA: Diagnosis present

## 2021-02-26 DIAGNOSIS — B9561 Methicillin susceptible Staphylococcus aureus infection as the cause of diseases classified elsewhere: Secondary | ICD-10-CM

## 2021-02-26 DIAGNOSIS — M00011 Staphylococcal arthritis, right shoulder: Secondary | ICD-10-CM

## 2021-02-26 DIAGNOSIS — T8141XA Infection following a procedure, superficial incisional surgical site, initial encounter: Secondary | ICD-10-CM

## 2021-02-26 DIAGNOSIS — M542 Cervicalgia: Secondary | ICD-10-CM

## 2021-02-26 DIAGNOSIS — J441 Chronic obstructive pulmonary disease with (acute) exacerbation: Secondary | ICD-10-CM | POA: Diagnosis not present

## 2021-02-26 LAB — CBC
HCT: 30.5 % — ABNORMAL LOW (ref 39.0–52.0)
HCT: 31.2 % — ABNORMAL LOW (ref 39.0–52.0)
Hemoglobin: 9.7 g/dL — ABNORMAL LOW (ref 13.0–17.0)
Hemoglobin: 9.8 g/dL — ABNORMAL LOW (ref 13.0–17.0)
MCH: 27.9 pg (ref 26.0–34.0)
MCH: 27.8 pg (ref 26.0–34.0)
MCHC: 31.8 g/dL (ref 30.0–36.0)
MCHC: 31.4 g/dL (ref 30.0–36.0)
MCV: 87.6 fL (ref 80.0–100.0)
MCV: 88.6 fL (ref 80.0–100.0)
Platelets: 218 10*3/uL (ref 150–400)
Platelets: 224 10*3/uL (ref 150–400)
RBC: 3.48 MIL/uL — ABNORMAL LOW (ref 4.22–5.81)
RBC: 3.52 MIL/uL — ABNORMAL LOW (ref 4.22–5.81)
RDW: 17.9 % — ABNORMAL HIGH (ref 11.5–15.5)
RDW: 18.3 % — ABNORMAL HIGH (ref 11.5–15.5)
WBC: 12.8 10*3/uL — ABNORMAL HIGH (ref 4.0–10.5)
WBC: 10 10*3/uL (ref 4.0–10.5)
nRBC: 0.2 % (ref 0.0–0.2)
nRBC: 0.2 % (ref 0.0–0.2)

## 2021-02-26 LAB — BLOOD CULTURE ID PANEL (REFLEXED) - BCID2

## 2021-02-26 LAB — EXPECTORATED SPUTUM ASSESSMENT W GRAM STAIN, RFLX TO RESP C

## 2021-02-26 LAB — BASIC METABOLIC PANEL
Anion gap: 10 (ref 5–15)
BUN: 33 mg/dL — ABNORMAL HIGH (ref 6–20)
CO2: 29 mmol/L (ref 22–32)
Calcium: 8.8 mg/dL — ABNORMAL LOW (ref 8.9–10.3)
Chloride: 106 mmol/L (ref 98–111)
Creatinine, Ser: 1.49 mg/dL — ABNORMAL HIGH (ref 0.61–1.24)
GFR, Estimated: 58 mL/min — ABNORMAL LOW (ref >60)
Glucose, Bld: 94 mg/dL (ref 70–99)
Potassium: 3.4 mmol/L — ABNORMAL LOW (ref 3.5–5.1)
Sodium: 145 mmol/L (ref 135–145)

## 2021-02-26 MED ORDER — POVIDONE-IODINE 10 % EX SWAB: 2.0000 "application " | Freq: Once | CUTANEOUS | Status: DC

## 2021-02-26 MED ORDER — VANCOMYCIN HCL 1000 MG/200ML IV SOLN
1000.0000 mg | INTRAVENOUS | Status: DC
  Filled 2021-02-26: qty 200

## 2021-02-26 MED ORDER — ENSURE PRE-SURGERY PO LIQD
296.0000 mL | Freq: Once | ORAL | Status: AC
  Administered 2021-02-26: 296 mL via ORAL
  Filled 2021-02-26: qty 296

## 2021-02-26 MED ORDER — IPRATROPIUM-ALBUTEROL 0.5-2.5 (3) MG/3ML IN SOLN
3.0000 mL | RESPIRATORY_TRACT | Status: DC | PRN
  Administered 2021-02-27 (×2): 3 mL via RESPIRATORY_TRACT
  Filled 2021-02-26 (×3): qty 3

## 2021-02-26 MED ORDER — CHLORHEXIDINE GLUCONATE 4 % EX LIQD: 60.0000 mL | Freq: Once | CUTANEOUS | Status: DC

## 2021-02-26 MED ORDER — CEFAZOLIN SODIUM-DEXTROSE 2-4 GM/100ML-% IV SOLN
2.0000 g | Freq: Three times a day (TID) | INTRAVENOUS | Status: DC
  Administered 2021-02-26 – 2021-03-03 (×12): 2 g via INTRAVENOUS
  Filled 2021-02-26 (×16): qty 100

## 2021-02-26 MED ORDER — CHLORHEXIDINE GLUCONATE 4 % EX LIQD
60.0000 mL | Freq: Once | CUTANEOUS | Status: DC
  Filled 2021-02-26: qty 60

## 2021-02-26 MED ORDER — CEFAZOLIN SODIUM-DEXTROSE 2-4 GM/100ML-% IV SOLN
2.0000 g | INTRAVENOUS | Status: DC
  Filled 2021-02-26 (×2): qty 100

## 2021-02-26 NOTE — Progress Notes (Signed)
PHARMACY - PHYSICIAN COMMUNICATION CRITICAL VALUE ALERT - BLOOD CULTURE IDENTIFICATION (BCID)  Ernest Reese is an 50 y.o. male who presented to [facility] on 02/24/2021 with a chief complaint of cellulitis/PNA  Assessment:  WBC mildly elevated, last temp 97.5  Name of physician (or Provider) Contacted: Dr. Nita Sells   Current antibiotics: Vancomycin/Zosyn  Changes to prescribed antibiotics recommended:  Ok to continue vanco/zosyn for now with multi-focal PNA Consider de-escalation to Ancef soon  Results for orders placed or performed during the hospital encounter of 02/24/21  Blood Culture ID Panel (Reflexed) (Collected: 02/24/2021 10:27 AM)  Result Value Ref Range   Enterococcus faecalis NOT DETECTED NOT DETECTED   Enterococcus Faecium NOT DETECTED NOT DETECTED   Listeria monocytogenes NOT DETECTED NOT DETECTED   Staphylococcus species DETECTED (A) NOT DETECTED   Staphylococcus aureus (BCID) DETECTED (A) NOT DETECTED   Staphylococcus epidermidis NOT DETECTED NOT DETECTED   Staphylococcus lugdunensis NOT DETECTED NOT DETECTED   Streptococcus species NOT DETECTED NOT DETECTED   Streptococcus agalactiae NOT DETECTED NOT DETECTED   Streptococcus pneumoniae NOT DETECTED NOT DETECTED   Streptococcus pyogenes NOT DETECTED NOT DETECTED   A.calcoaceticus-baumannii NOT DETECTED NOT DETECTED   Bacteroides fragilis NOT DETECTED NOT DETECTED   Enterobacterales NOT DETECTED NOT DETECTED   Enterobacter cloacae complex NOT DETECTED NOT DETECTED   Escherichia coli NOT DETECTED NOT DETECTED   Klebsiella aerogenes NOT DETECTED NOT DETECTED   Klebsiella oxytoca NOT DETECTED NOT DETECTED   Klebsiella pneumoniae NOT DETECTED NOT DETECTED   Proteus species NOT DETECTED NOT DETECTED   Salmonella species NOT DETECTED NOT DETECTED   Serratia marcescens NOT DETECTED NOT DETECTED   Haemophilus influenzae NOT DETECTED NOT DETECTED   Neisseria meningitidis NOT DETECTED NOT DETECTED   Pseudomonas  aeruginosa NOT DETECTED NOT DETECTED   Stenotrophomonas maltophilia NOT DETECTED NOT DETECTED   Candida albicans NOT DETECTED NOT DETECTED   Candida auris NOT DETECTED NOT DETECTED   Candida glabrata NOT DETECTED NOT DETECTED   Candida krusei NOT DETECTED NOT DETECTED   Candida parapsilosis NOT DETECTED NOT DETECTED   Candida tropicalis NOT DETECTED NOT DETECTED   Cryptococcus neoformans/gattii NOT DETECTED NOT DETECTED   Meth resistant mecA/C and MREJ NOT DETECTED NOT DETECTED    Ernest Reese 02/26/2021  3:52 AM

## 2021-02-26 NOTE — Progress Notes (Signed)
Passed onto night shift t notify AM MDs to fax verification to patient's parole officer that the patient is in the hospital since 2/28 and the projected discharge date. Fax number: [phone] c/o <Malaysha Medearis.

## 2021-02-26 NOTE — Progress Notes (Signed)
Pharmacy Antibiotic Note  Ernest Reese is a 50 y.o. male admitted on 02/24/2021 with bacteremia and pneumonia. Presented with complaint of worsening shoulder pain, new abscess, and increased work of breathing. Recently underwent arthroplasty of the shoulder on 2/15 and has PMH of COPD. Blood cultures positive for GPC's in clusters. BCID positive for MSSA. Pharmacy has been consulted for cefazolin dosing.  Renal function stable. Afebrile and WBCs trending down.   Plan: D/C vanc and zosyn. Start cefazolin IV 2 g Q8HR. Monitor renal fx, WBCs, and blood culture results. Follow LOT. ID following.   Height: 6' (182.9 cm) Weight: 81.7 kg (180 lb 1.9 oz) IBW/kg (Calculated) : 77.6  Temp (24hrs), Avg:97.6 F (36.4 C), Min:97.5 F (36.4 C), Max:97.6 F (36.4 C)  Recent Labs  Lab 02/24/21 1035 02/25/21 0041 02/26/21 0150  WBC 18.5* 11.8* 12.8*  CREATININE 1.52* 1.57*  --     Estimated Creatinine Clearance: 63.2 mL/min (A) (by C-G formula based on SCr of 1.57 mg/dL (H)).    No Known Allergies  Antimicrobials this admission: Vancomycin 2/28 >> 3/2 Zosyn 2/28 >> 3/2  Dose adjustments this admission: N/A  Microbiology results: 2/28 BCx: gram positive cocci in clusters 2/28 BCID: Staphylococcus aureus, mecA negative 3/1 Fungal cx: sent 3/1 sputum: pending 2/15 MRSA PCR: positive 3/2 F/u blood cx : sent  Thank you for allowing pharmacy to be a part of this patient's care.  Mickeal Skinner, PharmD Student 02/26/2021 7:22 AM

## 2021-02-26 NOTE — Progress Notes (Signed)
Subjective:  Patient reports pain as moderate to severe.  He reports pain of the right shoulder, denies numbness or tingling . Reports swollen, feels like it has been swollen since initial surgery. Denies fever or chills.     02/11/2021  2:43 PM  PATIENT:  Ernest Reese    PRE-OPERATIVE DIAGNOSIS:   1.  Right shoulder osteoarthropathy 2.  Right shoulder proximal humerus collapse with atrophic changes of the glenoid and proximal humerus  POST-OPERATIVE DIAGNOSIS:  Same  PROCEDURE: Right shoulder hemiarthroplasty  SURGEON:  Nicholes Stairs, MD Objective:   VITALS:   Vitals:   02/26/21 0502 02/26/21 0817 02/26/21 0833 02/26/21 1348  BP: 117/68 132/84  118/80  Pulse: 88 81  81  Resp: 16 18  18   Temp: 97.6 F (36.4 C) 98.1 F (36.7 C)  98 F (36.7 C)  TempSrc: Axillary Oral  Oral  SpO2: 98% 96% 94% (!) 86%  Weight:      Height:         Right Upper Extremity:     INSPECTION & PALPATION: Healed deltopectoral incision with mild erythema, palpable fluid at the anterior shoulder.    SENSORY: sensation is intact to light touch in:  superficial radial nerve distribution (dorsal first web space) median nerve distribution (tip of index finger)   ulnar nerve distribution (tip of small finger)    Axillary nerve distribution (lateral shoulder)   ROM: painless active range of motion of the elbow and wrist.   MOTOR:  + motor posterior interosseous nerve (thumb IP extension) + anterior interosseous nerve (thumb IP flexion, index finger DIP flexion) + radial nerve (wrist extension) + median nerve (palpable firing thenar mass) + ulnar nerve (palpable firing of first dorsal interosseous muscle) + axiallry nerve (palpable firing of deltoid)   VASCULAR: 2+ radial pulse, brisk capillary refill < 2 sec, fingers warm and well-perfused     Lab Results  Component Value Date   WBC 10.0 02/26/2021   HGB 9.8 (L) 02/26/2021   HCT 31.2 (L) 02/26/2021   MCV 88.6  02/26/2021   PLT 224 02/26/2021   BMET    Component Value Date/Time   NA 145 02/26/2021 1210   K 3.4 (L) 02/26/2021 1210   CL 106 02/26/2021 1210   CO2 29 02/26/2021 1210   GLUCOSE 94 02/26/2021 1210   BUN 33 (H) 02/26/2021 1210   CREATININE 1.49 (H) 02/26/2021 1210   CALCIUM 8.8 (L) 02/26/2021 1210   GFRNONAA 58 (L) 02/26/2021 1210   GFRAA >60 08/21/2020 1312     Assessment/Plan:     Principal Problem:   Surgical site infection Active Problems:   Renal insufficiency   Acute exacerbation of chronic obstructive airways disease (HCC)   Hx of hepatitis C, reportedly treated   Dyspnea   Tobacco abuse   Polysubstance abuse (HCC)   Recurrent history of leaving AMA   Atherosclerosis of aorta (HCC)   Right lung Multifocal, major fissure, pleural-based fluid areas   Cavitary lesion of right lower lung, superior segment   Ground glass opacity present on imaging of lung   COPD exacerbation (HCC)   Staphylococcus aureus bacteremia   S/P reverse total shoulder arthroplasty, right   Neck pain   Methicillin susceptible S. 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 CT revealed air fluid collection which could  represent infection vs. Hematoma.   Weightbearing Status: NWB Right Upper Extremity, no external rotation past 0 should be in sling majority of the time except to move elbow, wrist, digits.     Faythe Casa 02/26/2021, 3:53 PM  Jonelle Sidle PA-C  Physician Assistant with Dr. Lillia Abed [place]
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
## 2021-02-26 NOTE — Progress Notes (Signed)
Family Medicine Teaching Service Daily Progress Note Intern Pager: [phone]  Patient name: Ernest Reese Medical record number: [id number] Date of birth: 04/13/1971 Age: 50 y.o. Gender: male  Primary Care Provider: Patient, No Pcp Per Consultants: Ortho, pulmo Code Status: Full  Pt Overview and Major Events to Date:  2/28 Admitted   Assessment and Plan:  Ernest Reese a 50 y.o.malepresenting with SOB and shoulder pain, s/p arthroplasty 2 weeks ago. PMH is significant forCOPD, testicular cancer,anemia, tobacco use, [name]  SOBlikely multifactorial 2/2 multifocal pneumonia,COPD exacerbation Patient presents with shortness of breathfor the past week.  Currently on 3L [place]. CXR showed increased interstitial markings within the right perihilar and right basilar regions,and ovoid opacity within the peripheral aspect of the right mid lung, potentially representing pleural fluid within the minor fissure. Air projects within the soft tissues overlying the right shoulder, which may be related to recent postsurgical change.CTAnegative for pulmonaryemboliand showed multifocal areas of fluid within the right pleural space and fissures with some associated areas of cavitation as well as multifocal parenchymal inflammatory change.Blood cultures MSSA- de-escalate to ancef.  Echo with LVEF 60-65% - pulm consulted, stated no intervention is necessary at this time. Recommends repeating CT after 4-6 weeks of abx -albuterol inhaler as needed - Breo ellipta 1 puff daily  -duonebs prn  - wean O2 as tolerated  -Con'tVanc and Zosyn. De-escalate as appropriate - echo  - am BMP, CBC  Arthroplasty of left shoulder Shoulder surgical site infection Surgery 2 weeks ago. Patient presents with cellulitis of postop wound.Ortho following, appreciate continued involvement. Given new onset hypoxia, tachypnea and tachycardia, concern for MRSA PNA is high on differential. Per ortho, will  proceed with surgical drainage and debridement tomorrow, 3/3. CTA chest shows air fluid collection on upper R arm extendig into chest wall along pectoralis musclce suggestive of post op abscess - Cont Vanc and Zosyn, de escalate to ancef  - f/u with Ernest Reese outpatient   Renal Insufficiency Cr 1.52 on admission. 1.49 today. Previous admissions have ranged from 1.14-8.59 - am BMP - avoid nephro toxic agents - N/S at 100/hr  NormocyticAnemia  Hgb 10.2 > 9.7 down from 11.3. Baseline appears to beabout 12-14. No overt signs of bleeding. Likely due to infection. Normotensive. - consider iron studies - am CBC -monitor for signs of bleeding  Tobacco Use Reports 1 pack/dayfor his "whole life"  - Nicotine patch - encourage smoking cessation  Previous heroin use Endorses being off of heroin for the past year. Reports taking methadone 100 mg every morning at 6 AM from [facility]. States if he does not have this that he has pretty significant withdrawal - PDMP reviewed -Methadone 100 mg daily  FEN/GI:Regular diet Prophylaxis:Lovenox  Disposition: Med-tele   Subjective:  No acute events overnight. Patient appearing more tired this morning. Patient denies any complaints, just stating he is wanting to go home.   Objective: Temp:  [97.5 F (36.4 C)-97.6 F (36.4 C)] 97.6 F (36.4 C) (03/02 0502) Pulse Rate:  [88-99] 88 (03/02 0502) Resp:  [15-16] 16 (03/02 0502) BP: (108-117)/(68) 117/68 (03/02 0502) SpO2:  [82 %-98 %] 98 % (03/02 0502) Physical Exam: General: alert, NAD Cardiovascular: RRR no murmurs Respiratory: wheezing in all lung fields. On 3L   Abdomen: soft, non distended, non tender  Extremities: warm, dry   Laboratory: Recent Labs  Lab 02/24/21 1035 02/25/21 0041 02/26/21 0150  WBC 18.5* 11.8* 12.8*  HGB 11.3* 10.2* 9.7*  HCT 35.8* 31.7* 30.5*  PLT 243 215 218   Recent Labs  Lab 02/24/21 1035 02/25/21 0041  NA 140 143  K 3.6 3.8   CL 99 102  CO2 27 30  BUN 30* 30*  CREATININE 1.52* 1.57*  CALCIUM 8.6* 8.8*  GLUCOSE 128* 230*    Imaging/Diagnostic Tests:  ECHOCARDIOGRAM COMPLETE  Result Date: 02/25/2021    ECHOCARDIOGRAM REPORT   Patient Name:   Ernest Reese Date of Exam: 02/25/2021 Medical Rec #:  [id number]     Height:       72.0 in Accession #:    [id number]    Weight:       180.1 lb Date of Birth:  1971-11-30    BSA:          2.038 m Patient Age:    23 years      BP:           115/72 mmHg Patient Gender: M             HR:           81 bpm. Exam Location:  Inpatient Procedure: 2D Echo, Cardiac Doppler and Color Doppler Indications:    Dyspnea  History:        Patient has no prior history of Echocardiogram examinations.                 COPD; Signs/Symptoms:Shortness of Breath.  Sonographer:    Ernest Reese RDCS (AE) Referring Phys: [place]  1. Left ventricular ejection fraction, by estimation, is 60 to 65%. The left ventricle has normal function. The left ventricle has no regional wall motion abnormalities. Left ventricular diastolic parameters were normal.  2. Right ventricular systolic function is normal. The right ventricular size is normal.  3. The mitral valve is normal in structure. No evidence of mitral valve regurgitation. No evidence of mitral stenosis.  4. The aortic valve is normal in structure. There is mild calcification of the aortic valve. Aortic valve regurgitation is mild. Mild aortic valve sclerosis is present, with no evidence of aortic valve stenosis.  5. Aortic dilatation noted. There is moderate dilatation of the aortic root, measuring 41 mm.  6. The inferior vena cava is normal in size with greater than 50% respiratory variability, suggesting right atrial pressure of 3 mmHg. FINDINGS  Left Ventricle: Left ventricular ejection fraction, by estimation, is 60 to 65%. The left ventricle has normal function. The left ventricle has no regional wall motion abnormalities. The left  ventricular internal cavity size was normal in size. There is  no left ventricular hypertrophy. Left ventricular diastolic parameters were normal. Right Ventricle: The right ventricular size is normal. No increase in right ventricular wall thickness. Right ventricular systolic function is normal. Left Atrium: Left atrial size was normal in size. Right Atrium: Right atrial size was normal in size. Pericardium: There is no evidence of pericardial effusion. Mitral Valve: The mitral valve is normal in structure. No evidence of mitral valve regurgitation. No evidence of mitral valve stenosis. MV peak gradient, 4.1 mmHg. The mean mitral valve gradient is 2.0 mmHg. Tricuspid Valve: The tricuspid valve is normal in structure. Tricuspid valve regurgitation is not demonstrated. No evidence of tricuspid stenosis. Aortic Valve: The aortic valve is normal in structure. There is mild calcification of the aortic valve. Aortic valve regurgitation is mild. Aortic regurgitation PHT measures 399 msec. Mild aortic valve sclerosis is present, with no evidence of aortic valve stenosis. Aortic valve mean gradient measures 5.0 mmHg. Aortic  valve peak gradient measures 11.4 mmHg. Aortic valve area, by VTI measures 3.19 cm. Pulmonic Valve: The pulmonic valve was normal in structure. Pulmonic valve regurgitation is not visualized. No evidence of pulmonic stenosis. Aorta: The aortic root is normal in size and structure and aortic dilatation noted. There is moderate dilatation of the aortic root, measuring 41 mm. Venous: The inferior vena cava is normal in size with greater than 50% respiratory variability, suggesting right atrial pressure of 3 mmHg. IAS/Shunts: No atrial level shunt detected by color flow Doppler.   Shary Key, DO 02/26/2021, 8:05 AM PGY-1, [place] Intern pager: [phone], text pages welcome
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
## 2021-02-26 NOTE — Progress Notes (Signed)
Ok to give lovenox today per ortho.

## 2021-02-27 ENCOUNTER — Encounter (HOSPITAL_COMMUNITY): Payer: Self-pay | Admitting: Family Medicine

## 2021-02-27 ENCOUNTER — Inpatient Hospital Stay (HOSPITAL_COMMUNITY): Payer: Medicare HMO | Admitting: Certified Registered Nurse Anesthetist

## 2021-02-27 ENCOUNTER — Inpatient Hospital Stay (HOSPITAL_COMMUNITY): Payer: Medicare HMO

## 2021-02-27 ENCOUNTER — Encounter (HOSPITAL_COMMUNITY)
Admission: EM | Disposition: A | Discharge status: Home / Self Care | Payer: Self-pay | Source: Home / Self Care | Attending: Family Medicine

## 2021-02-27 DIAGNOSIS — J441 Chronic obstructive pulmonary disease with (acute) exacerbation: Principal | ICD-10-CM

## 2021-02-27 DIAGNOSIS — T8149XA Infection following a procedure, other surgical site, initial encounter: Secondary | ICD-10-CM | POA: Diagnosis not present

## 2021-02-27 DIAGNOSIS — R7881 Bacteremia: Secondary | ICD-10-CM | POA: Diagnosis not present

## 2021-02-27 DIAGNOSIS — Z96611 Presence of right artificial shoulder joint: Secondary | ICD-10-CM | POA: Diagnosis not present

## 2021-02-27 DIAGNOSIS — J984 Other disorders of lung: Secondary | ICD-10-CM | POA: Diagnosis not present

## 2021-02-27 DIAGNOSIS — T8141XD Infection following a procedure, superficial incisional surgical site, subsequent encounter: Secondary | ICD-10-CM | POA: Diagnosis not present

## 2021-02-27 DIAGNOSIS — J449 Chronic obstructive pulmonary disease, unspecified: Secondary | ICD-10-CM

## 2021-02-27 DIAGNOSIS — R918 Other nonspecific abnormal finding of lung field: Secondary | ICD-10-CM | POA: Diagnosis not present

## 2021-02-27 HISTORY — PX: I & D EXTREMITY: SHX5045

## 2021-02-27 LAB — BLOOD GAS, ARTERIAL
Acid-Base Excess: 5.9 mmol/L — ABNORMAL HIGH (ref 0.0–2.0)
Bicarbonate: 30.8 mmol/L — ABNORMAL HIGH (ref 20.0–28.0)
Drawn by: 59156
FIO2: 12
O2 Saturation: 88 %
Patient temperature: 37.5
pCO2 arterial: 53.6 mmHg — ABNORMAL HIGH (ref 32.0–48.0)
pH, Arterial: 7.38 (ref 7.350–7.450)
pO2, Arterial: 57.4 mmHg — ABNORMAL LOW (ref 83.0–108.0)

## 2021-02-27 LAB — CBC
HCT: 34 % — ABNORMAL LOW (ref 39.0–52.0)
Hemoglobin: 10.9 g/dL — ABNORMAL LOW (ref 13.0–17.0)
MCH: 27.7 pg (ref 26.0–34.0)
MCHC: 32.1 g/dL (ref 30.0–36.0)
MCV: 86.3 fL (ref 80.0–100.0)
Platelets: 290 10*3/uL (ref 150–400)
RBC: 3.94 MIL/uL — ABNORMAL LOW (ref 4.22–5.81)
RDW: 17.7 % — ABNORMAL HIGH (ref 11.5–15.5)
WBC: 16.1 10*3/uL — ABNORMAL HIGH (ref 4.0–10.5)
nRBC: 0.1 % (ref 0.0–0.2)

## 2021-02-27 LAB — HEPATITIS C ANTIBODY: HCV Ab: REACTIVE — AB

## 2021-02-27 LAB — BASIC METABOLIC PANEL
Anion gap: 15 (ref 5–15)
BUN: 19 mg/dL (ref 6–20)
CO2: 25 mmol/L (ref 22–32)
Calcium: 8.5 mg/dL — ABNORMAL LOW (ref 8.9–10.3)
Chloride: 101 mmol/L (ref 98–111)
Creatinine, Ser: 1.1 mg/dL (ref 0.61–1.24)
GFR, Estimated: >60 mL/min (ref >60)
Glucose, Bld: 81 mg/dL (ref 70–99)
Potassium: 3.3 mmol/L — ABNORMAL LOW (ref 3.5–5.1)
Sodium: 141 mmol/L (ref 135–145)

## 2021-02-27 LAB — ACID FAST SMEAR (AFB, MYCOBACTERIA): Acid Fast Smear: NEGATIVE

## 2021-02-27 LAB — MAGNESIUM: Magnesium: 1.4 mg/dL — ABNORMAL LOW (ref 1.7–2.4)

## 2021-02-27 SURGERY — IRRIGATION AND DEBRIDEMENT EXTREMITY
Anesthesia: General | Site: Shoulder | Laterality: Right

## 2021-02-27 MED ORDER — METOCLOPRAMIDE HCL 5 MG/ML IJ SOLN
5.0000 mg | Freq: Three times a day (TID) | INTRAMUSCULAR | Status: DC | PRN
  Administered 2021-03-01: 5 mg via INTRAVENOUS
  Filled 2021-02-27: qty 2

## 2021-02-27 MED ORDER — SUCCINYLCHOLINE CHLORIDE 200 MG/10ML IV SOSY
PREFILLED_SYRINGE | INTRAVENOUS | Status: DC | PRN
  Administered 2021-02-27: 140 mg via INTRAVENOUS

## 2021-02-27 MED ORDER — ALBUTEROL SULFATE (2.5 MG/3ML) 0.083% IN NEBU: 2.5000 mg | INHALATION_SOLUTION | RESPIRATORY_TRACT | Status: DC | PRN

## 2021-02-27 MED ORDER — PROPOFOL 10 MG/ML IV BOLUS
INTRAVENOUS | Status: DC | PRN
  Administered 2021-02-27: 160 mg via INTRAVENOUS

## 2021-02-27 MED ORDER — CHLORHEXIDINE GLUCONATE 0.12 % MT SOLN
15.0000 mL | Freq: Once | OROMUCOSAL | Status: AC
  Administered 2021-02-27: 15 mL via OROMUCOSAL
  Filled 2021-02-27: qty 15

## 2021-02-27 MED ORDER — FENTANYL CITRATE (PF) 250 MCG/5ML IJ SOLN
INTRAMUSCULAR | Status: AC
  Filled 2021-02-27: qty 5

## 2021-02-27 MED ORDER — TIZANIDINE HCL 4 MG PO TABS: 4.0000 mg | ORAL_TABLET | Freq: Four times a day (QID) | ORAL | Status: DC | PRN

## 2021-02-27 MED ORDER — METHYLPREDNISOLONE SODIUM SUCC 40 MG IJ SOLR
40.0000 mg | Freq: Once | INTRAMUSCULAR | Status: AC
  Administered 2021-02-27: 40 mg via INTRAVENOUS
  Filled 2021-02-27: qty 1

## 2021-02-27 MED ORDER — IBUPROFEN 400 MG PO TABS
800.0000 mg | ORAL_TABLET | Freq: Three times a day (TID) | ORAL | Status: DC
  Administered 2021-02-27 – 2021-03-02 (×9): 800 mg via ORAL
  Filled 2021-02-27 (×9): qty 2

## 2021-02-27 MED ORDER — VANCOMYCIN HCL 1000 MG IV SOLR
INTRAVENOUS | Status: DC | PRN
  Administered 2021-02-27: 1 g via TOPICAL

## 2021-02-27 MED ORDER — ONDANSETRON HCL 4 MG/2ML IJ SOLN
INTRAMUSCULAR | Status: DC | PRN
  Administered 2021-02-27: 4 mg via INTRAVENOUS

## 2021-02-27 MED ORDER — TRAZODONE HCL 50 MG PO TABS
100.0000 mg | ORAL_TABLET | Freq: Every day | ORAL | Status: DC
  Administered 2021-02-27 – 2021-03-02 (×4): 100 mg via ORAL
  Filled 2021-02-27 (×4): qty 2

## 2021-02-27 MED ORDER — PROPOFOL 10 MG/ML IV BOLUS
INTRAVENOUS | Status: AC
  Filled 2021-02-27: qty 20

## 2021-02-27 MED ORDER — VANCOMYCIN HCL 1000 MG IV SOLR
INTRAVENOUS | Status: DC | PRN
  Administered 2021-02-27: 1000 mg via INTRAVENOUS

## 2021-02-27 MED ORDER — METOCLOPRAMIDE HCL 10 MG PO TABS: 5.0000 mg | ORAL_TABLET | Freq: Three times a day (TID) | ORAL | Status: DC | PRN

## 2021-02-27 MED ORDER — METHYLPREDNISOLONE SODIUM SUCC 40 MG IJ SOLR: 40.0000 mg | Freq: Two times a day (BID) | INTRAMUSCULAR | Status: DC

## 2021-02-27 MED ORDER — LIDOCAINE 2% (20 MG/ML) 5 ML SYRINGE
INTRAMUSCULAR | Status: DC | PRN
  Administered 2021-02-27: 100 mg via INTRAVENOUS

## 2021-02-27 MED ORDER — FENTANYL CITRATE (PF) 250 MCG/5ML IJ SOLN
INTRAMUSCULAR | Status: DC | PRN
  Administered 2021-02-27: 25 ug via INTRAVENOUS
  Administered 2021-02-27: 100 ug via INTRAVENOUS

## 2021-02-27 MED ORDER — VANCOMYCIN HCL IN DEXTROSE 1-5 GM/200ML-% IV SOLN: 1000.0000 mg | INTRAVENOUS | Status: DC

## 2021-02-27 MED ORDER — POTASSIUM CHLORIDE 20 MEQ PO PACK: 40.0000 meq | PACK | Freq: Once | ORAL | Status: DC

## 2021-02-27 MED ORDER — PHENYLEPHRINE HCL-NACL 10-0.9 MG/250ML-% IV SOLN
INTRAVENOUS | Status: DC | PRN
  Administered 2021-02-27: 30 ug/min via INTRAVENOUS

## 2021-02-27 MED ORDER — VANCOMYCIN HCL 1000 MG IV SOLR
INTRAVENOUS | Status: AC
  Filled 2021-02-27: qty 1000

## 2021-02-27 MED ORDER — MAGNESIUM SULFATE 2 GM/50ML IV SOLN
2.0000 g | Freq: Once | INTRAVENOUS | Status: AC
  Administered 2021-02-27: 2 g via INTRAVENOUS
  Filled 2021-02-27: qty 50

## 2021-02-27 MED ORDER — FENTANYL CITRATE (PF) 100 MCG/2ML IJ SOLN: 25.0000 ug | INTRAMUSCULAR | Status: DC | PRN

## 2021-02-27 MED ORDER — METHYLPREDNISOLONE SODIUM SUCC 40 MG IJ SOLR
40.0000 mg | Freq: Every day | INTRAMUSCULAR | Status: AC
  Administered 2021-02-28 – 2021-03-01 (×2): 40 mg via INTRAVENOUS
  Filled 2021-02-27 (×2): qty 1

## 2021-02-27 MED ORDER — DEXAMETHASONE SODIUM PHOSPHATE 10 MG/ML IJ SOLN
INTRAMUSCULAR | Status: DC | PRN
  Administered 2021-02-27: 5 mg via INTRAVENOUS

## 2021-02-27 MED ORDER — ONDANSETRON HCL 4 MG/2ML IJ SOLN
4.0000 mg | Freq: Four times a day (QID) | INTRAMUSCULAR | Status: DC | PRN
  Administered 2021-02-28: 4 mg via INTRAVENOUS
  Filled 2021-02-27 (×2): qty 2

## 2021-02-27 MED ORDER — ONDANSETRON HCL 4 MG PO TABS
4.0000 mg | ORAL_TABLET | Freq: Four times a day (QID) | ORAL | Status: DC | PRN
  Administered 2021-03-01 – 2021-03-03 (×4): 4 mg via ORAL
  Filled 2021-02-27 (×4): qty 1

## 2021-02-27 MED ORDER — SODIUM CHLORIDE 0.9 % IR SOLN
Status: DC | PRN
  Administered 2021-02-27: 1000 mL

## 2021-02-27 MED ORDER — LACTATED RINGERS IV SOLN: INTRAVENOUS | Status: DC | PRN

## 2021-02-27 MED ORDER — LACTATED RINGERS IV SOLN: INTRAVENOUS | Status: DC

## 2021-02-27 MED ORDER — IPRATROPIUM-ALBUTEROL 0.5-2.5 (3) MG/3ML IN SOLN
3.0000 mL | RESPIRATORY_TRACT | Status: DC
  Administered 2021-02-27 – 2021-02-28 (×5): 3 mL via RESPIRATORY_TRACT
  Filled 2021-02-27 (×6): qty 3

## 2021-02-27 MED ORDER — SODIUM CHLORIDE 0.9 % IR SOLN
Status: DC | PRN
  Administered 2021-02-27: 3000 mL

## 2021-02-27 MED ORDER — MIDAZOLAM HCL 2 MG/2ML IJ SOLN
INTRAMUSCULAR | Status: AC
  Filled 2021-02-27: qty 2

## 2021-02-27 MED ORDER — PHENYLEPHRINE 40 MCG/ML (10ML) SYRINGE FOR IV PUSH (FOR BLOOD PRESSURE SUPPORT)
PREFILLED_SYRINGE | INTRAVENOUS | Status: DC | PRN
  Administered 2021-02-27 (×2): 120 ug via INTRAVENOUS

## 2021-02-27 MED ORDER — DOCUSATE SODIUM 100 MG PO CAPS
100.0000 mg | ORAL_CAPSULE | Freq: Two times a day (BID) | ORAL | Status: DC
  Administered 2021-02-27 – 2021-03-02 (×3): 100 mg via ORAL
  Filled 2021-02-27 (×6): qty 1

## 2021-02-27 SURGICAL SUPPLY — 71 items
ALCOHOL 70% 16 OZ (MISCELLANEOUS) ×2 IMPLANT
BLADE SURG 10 STRL SS (BLADE) ×2 IMPLANT
BNDG COHESIVE 1X5 TAN STRL LF (GAUZE/BANDAGES/DRESSINGS) IMPLANT
BNDG COHESIVE 4X5 TAN STRL (GAUZE/BANDAGES/DRESSINGS) ×2 IMPLANT
BNDG COHESIVE 6X5 TAN STRL LF (GAUZE/BANDAGES/DRESSINGS) ×4 IMPLANT
BNDG CONFORM 3 STRL LF (GAUZE/BANDAGES/DRESSINGS) IMPLANT
BNDG ELASTIC 3X5.8 VLCR STR LF (GAUZE/BANDAGES/DRESSINGS) IMPLANT
BNDG GAUZE ELAST 4 BULKY (GAUZE/BANDAGES/DRESSINGS) ×6 IMPLANT
CORD BIPOLAR FORCEPS 12FT (ELECTRODE) IMPLANT
COVER SURGICAL LIGHT HANDLE (MISCELLANEOUS) ×2 IMPLANT
COVER WAND RF STERILE (DRAPES) ×2 IMPLANT
CUFF TOURN SGL QUICK 24 (TOURNIQUET CUFF)
CUFF TOURN SGL QUICK 42 (TOURNIQUET CUFF) IMPLANT
CUFF TRNQT CYL 24X4X16.5-23 (TOURNIQUET CUFF) IMPLANT
DRAPE EXTREMITY BILATERAL (DRAPES) IMPLANT
DRAPE IMP U-DRAPE 54X76 (DRAPES) IMPLANT
DRAPE INCISE IOBAN 66X45 STRL (DRAPES) ×8 IMPLANT
DRAPE SURG 17X23 STRL (DRAPES) IMPLANT
DRAPE U-SHAPE 47X51 STRL (DRAPES) ×2 IMPLANT
DRSG ADAPTIC 3X8 NADH LF (GAUZE/BANDAGES/DRESSINGS) ×1 IMPLANT
DRSG PAD ABDOMINAL 8X10 ST (GAUZE/BANDAGES/DRESSINGS) ×2 IMPLANT
DURAPREP 26ML APPLICATOR (WOUND CARE) ×2 IMPLANT
ELECT CAUTERY BLADE 6.4 (BLADE) ×2 IMPLANT
ELECT REM PT RETURN 9FT ADLT (ELECTROSURGICAL)
ELECTRODE REM PT RTRN 9FT ADLT (ELECTROSURGICAL) IMPLANT
EVACUATOR 1/8 PVC DRAIN (DRAIN) ×1 IMPLANT
FACESHIELD WRAPAROUND (MASK) IMPLANT
FACESHIELD WRAPAROUND OR TEAM (MASK) IMPLANT
GAUZE SPONGE 4X4 12PLY STRL (GAUZE/BANDAGES/DRESSINGS) ×4 IMPLANT
GAUZE SPONGE 4X4 12PLY STRL LF (GAUZE/BANDAGES/DRESSINGS) ×1 IMPLANT
GAUZE XEROFORM 1X8 LF (GAUZE/BANDAGES/DRESSINGS) ×2 IMPLANT
GAUZE XEROFORM 5X9 LF (GAUZE/BANDAGES/DRESSINGS) ×2 IMPLANT
GLOVE BIO SURGEON STRL SZ7.5 (GLOVE) ×4 IMPLANT
GLOVE SRG 8 PF TXTR STRL LF DI (GLOVE) ×2 IMPLANT
GLOVE SURG UNDER POLY LF SZ8 (GLOVE) ×4
GOWN STRL REUS W/ TWL LRG LVL3 (GOWN DISPOSABLE) ×2 IMPLANT
GOWN STRL REUS W/ TWL XL LVL3 (GOWN DISPOSABLE) ×2 IMPLANT
GOWN STRL REUS W/TWL LRG LVL3 (GOWN DISPOSABLE) ×4
GOWN STRL REUS W/TWL XL LVL3 (GOWN DISPOSABLE) ×4
HANDPIECE INTERPULSE COAX TIP (DISPOSABLE)
KIT BASIN OR (CUSTOM PROCEDURE TRAY) ×2 IMPLANT
KIT TURNOVER KIT B (KITS) ×2 IMPLANT
MANIFOLD NEPTUNE II (INSTRUMENTS) ×2 IMPLANT
NS IRRIG 1000ML POUR BTL (IV SOLUTION) ×4 IMPLANT
PACK ORTHO EXTREMITY (CUSTOM PROCEDURE TRAY) ×2 IMPLANT
PAD ABD 8X10 STRL (GAUZE/BANDAGES/DRESSINGS) ×1 IMPLANT
PAD ARMBOARD 7.5X6 YLW CONV (MISCELLANEOUS) ×4 IMPLANT
PADDING CAST ABS 4INX4YD NS (CAST SUPPLIES) ×2
PADDING CAST ABS COTTON 4X4 ST (CAST SUPPLIES) ×2 IMPLANT
PADDING CAST COTTON 6X4 STRL (CAST SUPPLIES) ×2 IMPLANT
SET CYSTO W/LG BORE CLAMP LF (SET/KITS/TRAYS/PACK) ×2 IMPLANT
SET HNDPC FAN SPRY TIP SCT (DISPOSABLE) IMPLANT
SPONGE LAP 18X18 RF (DISPOSABLE) ×4 IMPLANT
STAPLER VISISTAT 35W (STAPLE) ×1 IMPLANT
STOCKINETTE IMPERVIOUS 9X36 MD (GAUZE/BANDAGES/DRESSINGS) ×2 IMPLANT
SUT ETHILON 2 0 FS 18 (SUTURE) IMPLANT
SUT ETHILON 2 0 PSLX (SUTURE) IMPLANT
SUT ETHILON 3 0 PS 1 (SUTURE) ×1 IMPLANT
SUT PDS AB 2-0 CT1 27 (SUTURE) ×1 IMPLANT
SUT VIC AB 2-0 CT1 36 (SUTURE) IMPLANT
SUT VIC AB 2-0 FS1 27 (SUTURE) IMPLANT
SWAB CULTURE ESWAB REG 1ML (MISCELLANEOUS) IMPLANT
SYR CONTROL 10ML LL (SYRINGE) IMPLANT
TAPE CLOTH SURG 4X10 WHT LF (GAUZE/BANDAGES/DRESSINGS) ×1 IMPLANT
TOWEL GREEN STERILE (TOWEL DISPOSABLE) ×2 IMPLANT
TOWEL GREEN STERILE FF (TOWEL DISPOSABLE) ×2 IMPLANT
TUBE CONNECTING 12X1/4 (SUCTIONS) ×2 IMPLANT
TUBE FEEDING ENTERAL 5FR 16IN (TUBING) IMPLANT
UNDERPAD 30X36 HEAVY ABSORB (UNDERPADS AND DIAPERS) ×4 IMPLANT
WATER STERILE IRR 1000ML POUR (IV SOLUTION) ×2 IMPLANT
YANKAUER SUCT BULB TIP NO VENT (SUCTIONS) ×2 IMPLANT

## 2021-02-27 NOTE — Transfer of Care (Signed)
Immediate Anesthesia Transfer of Care Note  Patient: Ernest Reese  Procedure(s) Performed: IRRIGATION AND DEBRIDEMENT EXTREMITY (Right Shoulder)  Patient Location: PACU  Anesthesia Type:General  Level of Consciousness: awake, alert  and patient cooperative  Airway & Oxygen Therapy: Patient Spontanous Breathing and Patient connected to face mask oxygen  Post-op Assessment: Report given to RN and Post -op Vital signs reviewed and stable  Post vital signs: Reviewed and stable  Last Vitals:  Vitals Value Taken Time  BP 140/86 02/27/21 1402  Temp    Pulse 99 02/27/21 1405  Resp 18 02/27/21 1405  SpO2 96 % 02/27/21 1405  Vitals shown include unvalidated device data.  Last Pain:  Vitals:   02/27/21 0800  TempSrc:   PainSc: 8       Patients Stated Pain Goal: 6 (50/38/88 2800)  Complications: No complications documented.

## 2021-02-27 NOTE — Progress Notes (Addendum)
Family Medicine Teaching Service Daily Progress Note Intern Pager: [phone]   Patient name: Ernest Reese Medical record number: [id number] Date of birth: [birth year] Age: 50 y.o. Gender: male  Primary Care Provider: Patient, No Pcp Per Consultants: Ortho, pulmo  Code Status: Full   Pt Overview and Major Events to Date:  2/28-Admitted  3/3-I&D left shoulder  Assessment and Plan:  Ernest Reese a 50 y.o.malepresenting with acute hypoxic respiratory failure, [name], [name], testicular cancer,anemia, tobacco use, [name]  Acute hypoxic respiratory failure Cavitary lung lesions in setting of MRSA bacteremia pneumonia COPD exacerbation Pt reports feeling more breathless this morning compared to usual. Sats currently 91% on 2 L. RR 18, HR 95, BP 158/85, T 99.6. His last oxygen requirement was over 24 hours ago per RN where he needed 3L. On examination: in acute respiratory distress, diffuse wheeze throughout lung fields. WBC 16 from 10 yesterday.  S/p vancomycin (2/28-3/2), Zosyn (2/28-3/2), Clinda (2/28) -Pulmonology following, appreciate recommendations-ortho evaluation for source of infection. Bronchoscopy not currently indicated.  -ID following, appreciate recommendations:TEE, repeat blood cultures, MRI c spine on 3/2 no evidence of discitis or osteomyelitis -Stat ABG -CXR -methylprednisolone 40mg  IV -Respiratory therapy -Albuterol neb Q4H PRN -DuoNebs Q4H -Breo Ellipta 1 puff daily -Robitussin Q4PRN  -Continue Ancef (3/2-), consider broadening abx back to vancomycin, zossyn given decline in patient   Shoulder abscess versus hematoma  s/p right shoulder arthroplasty  Pt reports on going left shoulder pain overnight. Did not have it in the sling this morning RN placed sling on him. On exam: left shoulder wound healing well, no bleeding or discharge.  Erythema surrounding wound extending over right UE. S/p arthroplasty 2 weeks ago.  CTPA on 2/28- concern for abscess versus hematoma of right shoulder -N.p.o. currently -Ortho following, appreciate recommendations -I&D today per orthopedics -Continue Ancef (3/2-) -Analgesia-Tylenol 1000 mg every 6 hourly, methadone 100mg  daily   Hypomagnesemia Mg 1.4 this morning -IV 2g Magnesium sulphate -Continue to monitor   Hypokalemia Pending K this morning -Replete as necessary -Monitor with BMP  Renal insufficiency Cr 1.49 yesterday. Pending BMP this morning.  Baseline appears to be 1.1 over the last 6 months -Continue N.saline 139ml/hr  -Monitor with BMP -Avoid nephrotoxic agents  Normocytic anemia Pending CBC this morning. Hb 9.8 yesterday, baseline appears to be 12-14. Likely due to chronic disease -Pending CBC am -Continue to monitor   History of heroin use chronic pain  -Methadone 100 mg daily  Tobacco misuse disorder -Nicotine patch 21 mg daily -Encourage smoking cessation   Constipation  -Miralax PRN   FEN/GI: NPO  PPx: Lovenox   Remains inpatient appropriate because:Persistent severe electrolyte disturbances, Ongoing active pain requiring inpatient pain management and Ongoing diagnostic testing needed not appropriate for outpatient work up   Dispo: The patient is from: Home              Anticipated d/c is to: Home              Patient currently is not medically stable to d/c.   Difficult to place patient No   Subjective:  Pt reports he could not get comfortable all night due to right shoulder pain. He has felt more breathless this morning. Has not yet received breathing treatment. Rn reports she has called RT to give breathing treatments.   Objective: Temp:  [97.9 F (36.6 C)-99.6 F (37.6 C)] 99.6 F (37.6 C) (  03/03 0440) Pulse Rate:  [80-81] 80 (03/03 0440) Resp:  [18-19] 18 (03/03 0440) BP: (118-142)/(80-87) 135/85 (03/03 0440) SpO2:  [86 %-90 %] 90 %  (03/03 0800)  Physical Exam: General: unwell appearing 50 year old male, appears older than stated age, in acute distress due to pain  Cardiovascular: RRR, no murmurs Respiratory: diffuse wheeze throughout lung fields Abdomen: non distended, soft non tender, bowel sounds present  Extremities: no peripheral edema  Shoulder:  left shoulder wound healing well, no bleeding or discharge. Erythema surrounding wound extending over right UE medially.  Laboratory: Recent Labs  Lab 02/26/21 0150 02/26/21 1210 02/27/21 0754  WBC 12.8* 10.0 16.1*  HGB 9.7* 9.8* 10.9*  HCT 30.5* 31.2* 34.0*  PLT 218 224 290   Recent Labs  Lab 02/24/21 1035 02/25/21 0041 02/26/21 1210  NA 140 143 145  K 3.6 3.8 3.4*  CL 99 102 106  CO2 27 30 29   BUN 30* 30* 33*  CREATININE 1.52* 1.57* 1.49*  CALCIUM 8.6* 8.8* 8.8*  GLUCOSE 128* 230* 94     Imaging/Diagnostic Tests: MR CERVICAL SPINE WO CONTRAST  Result Date: 02/26/2021 CLINICAL DATA:  Neck and right shoulder tenderness. MSSA sepsis. Suspected infection. History of IV drug abuse in remission. EXAM: MRI CERVICAL SPINE WITHOUT CONTRAST TECHNIQUE: Multiplanar, multisequence MR imaging of the cervical spine was performed. No intravenous contrast was administered. Study was ordered without and with contrast. Patient refused contrast administration. COMPARISON:  Limited correlation made with chest CTA 02/24/2021. No previous relevant spinal radiographs. FINDINGS: Despite efforts by the technologist and patient, mild motion artifact is present on today's exam and could not be eliminated. This reduces exam sensitivity and specificity. Alignment: Physiologic. Vertebrae: No acute or suspicious osseous findings. Status post C5-6 ACDF with expected hardware artifact. No signs of discitis or osteomyelitis. Cord: Normal in signal and caliber. Posterior Fossa, vertebral arteries, paraspinal tissues: Visualized portions of the posterior fossa appear unremarkable. No  significant paraspinal findings. Bilateral vertebral artery flow voids. Disc levels: Detail degraded by motion artifact. C2-3: Mild asymmetric uncinate spurring on the right. Mild right foraminal narrowing. The spinal canal is widely patent. C3-4: Loss of disc height with bilateral uncinate spurring and facet hypertrophy. Mild foraminal narrowing bilaterally. No cord deformity. C4-5: Spondylosis with a right paracentral disc protrusion and bilateral facet hypertrophy. No cord deformity. Mild right foraminal narrowing. C5-6: Post ACDF. Probable mild right-greater-than-left foraminal narrowing. No cord deformity. C6-7: Mild disc bulging with mild mass effect on the thecal sac. No cord deformity. Mild foraminal narrowing bilaterally. C7-T1: Normal interspace. IMPRESSION: 1. No acute findings or evidence of discitis or osteomyelitis. 2. Status post C5-6 ACDF. Although there is motion artifact on today's exam, no gross abnormality in the adjacent soft tissues or cord. 3. Mild multilevel spondylosis as described without cord deformity or abnormal cord signal. Right paracentral disc protrusion at C4-5 with resulting mild right foraminal narrowing. Electronically Signed   By: Richardean Sale M.D.   On: 02/26/2021 14:19   ECHOCARDIOGRAM COMPLETE  Result Date: 02/25/2021    ECHOCARDIOGRAM REPORT   Patient Name:   JEDI CATALFAMO Date of Exam: 02/25/2021 Medical Rec #:  [id number]     Height:       72.0 in Accession #:    [id number]    Weight:       180.1 lb Date of Birth:  1971-02-23    BSA:          2.038 m Patient Age:    63 years  BP:           115/72 mmHg Patient Gender: M             HR:           81 bpm. Exam Location:  Inpatient Procedure: 2D Echo, Cardiac Doppler and Color Doppler Indications:    Dyspnea  History:        Patient has no prior history of Echocardiogram examinations.                 COPD; Signs/Symptoms:Shortness of Breath.  Sonographer:    Clayton Lefort RDCS (AE) Referring Phys: [place]  1. Left ventricular ejection fraction, by estimation, is 60 to 65%. The left ventricle has normal function. The left ventricle has no regional wall motion abnormalities. Left ventricular diastolic parameters were normal.  2. Right ventricular systolic function is normal. The right ventricular size is normal.  3. The mitral valve is normal in structure. No evidence of mitral valve regurgitation. No evidence of mitral stenosis.  4. The aortic valve is normal in structure. There is mild calcification of the aortic valve. Aortic valve regurgitation is mild. Mild aortic valve sclerosis is present, with no evidence of aortic valve stenosis.  5. Aortic dilatation noted. There is moderate dilatation of the aortic root, measuring 41 mm.  6. The inferior vena cava is normal in size with greater than 50% respiratory variability, suggesting right atrial pressure of 3 mmHg. FINDINGS  Left Ventricle: Left ventricular ejection fraction, by estimation, is 60 to 65%. The left ventricle has normal function. The left ventricle has no regional wall motion abnormalities. The left ventricular internal cavity size was normal in size. There is  no left ventricular hypertrophy. Left ventricular diastolic parameters were normal. Right Ventricle: The right ventricular size is normal. No increase in right ventricular wall thickness. Right ventricular systolic function is normal. Left Atrium: Left atrial size was normal in size. Right Atrium: Right atrial size was normal in size. Pericardium: There is no evidence of pericardial effusion. Mitral Valve: The mitral valve is normal in structure. No evidence of mitral valve regurgitation. No evidence of mitral valve stenosis. MV peak gradient, 4.1 mmHg. The mean mitral valve gradient is 2.0 mmHg. Tricuspid Valve: The tricuspid valve is normal in structure. Tricuspid valve regurgitation is not demonstrated. No evidence of tricuspid stenosis. Aortic Valve: The aortic valve is normal in  structure. There is mild calcification of the aortic valve. Aortic valve regurgitation is mild. Aortic regurgitation PHT measures 399 msec. Mild aortic valve sclerosis is present, with no evidence of aortic valve stenosis. Aortic valve mean gradient measures 5.0 mmHg. Aortic valve peak gradient measures 11.4 mmHg. Aortic valve area, by VTI measures 3.19 cm. Pulmonic Valve: The pulmonic valve was normal in structure. Pulmonic valve regurgitation is not visualized. No evidence of pulmonic stenosis. Aorta: The aortic root is normal in size and structure and aortic dilatation noted. There is moderate dilatation of the aortic root, measuring 41 mm. Venous: The inferior vena cava is normal in size with greater than 50% respiratory variability, suggesting right atrial pressure of 3 mmHg. IAS/Shunts: No atrial level shunt detected by color flow Doppler.  LEFT VENTRICLE PLAX 2D LVIDd:         4.60 cm  Diastology LVIDs:         3.30 cm  LV e' medial:    11.30 cm/s LV PW:         1.20 cm  LV E/e'  medial:  7.9 LV IVS:        0.90 cm  LV e' lateral:   15.20 cm/s LVOT diam:     2.20 cm  LV E/e' lateral: 5.9 LV SV:         89 LV SV Index:   44 LVOT Area:     3.80 cm  RIGHT VENTRICLE             IVC RV Basal diam:  3.20 cm     IVC diam: 2.00 cm RV S prime:     14.80 cm/s TAPSE (M-mode): 2.4 cm LEFT ATRIUM             Index       RIGHT ATRIUM           Index LA diam:        3.70 cm 1.82 cm/m  RA Area:     15.00 cm LA Vol (A2C):   60.9 ml 29.88 ml/m RA Volume:   40.10 ml  19.68 ml/m LA Vol (A4C):   43.5 ml 21.35 ml/m LA Biplane Vol: 51.9 ml 25.47 ml/m  AORTIC VALVE AV Area (Vmax):    3.49 cm AV Area (Vmean):   3.03 cm AV Area (VTI):     3.19 cm AV Vmax:           169.00 cm/s AV Vmean:          106.000 cm/s AV VTI:            0.279 m AV Peak Grad:      11.4 mmHg AV Mean Grad:      5.0 mmHg LVOT Vmax:         155.00 cm/s LVOT Vmean:        84.600 cm/s LVOT VTI:          0.234 m LVOT/AV VTI ratio: 0.84 AI PHT:            399  msec  AORTA Ao Root diam: 4.10 cm MITRAL VALVE MV Area (PHT): 3.91 cm    SHUNTS MV Area VTI:   2.99 cm    Systemic VTI:  0.23 m MV Peak grad:  4.1 mmHg    Systemic Diam: 2.20 cm MV Mean grad:  2.0 mmHg MV Vmax:       1.01 m/s MV Vmean:      57.6 cm/s MV Decel Time: 194 msec MV E velocity: 89.50 cm/s MV A velocity: 81.00 cm/s MV E/A ratio:  1.10 Ernest Rouge MD Electronically signed by Ernest Rouge MD Signature Date/Time: 02/25/2021/4:40:48 PM    Final     Ernest Haw, MD 02/27/2021, 9:49 AM PGY-2, [facility] Intern pager: [phone], text pages welcome

## 2021-02-27 NOTE — Progress Notes (Signed)
Orthopedic Tech Progress Note Patient Details:  [name] 1971-07-22 [id number]  Patient ID: Ernest Reese, male   DOB: 1971-06-30, 50 y.o.   MRN: [id number] rn states pt has sling  Karolee Stamps 02/27/2021, 5:51 AM

## 2021-02-27 NOTE — Progress Notes (Signed)
FPTS Interim Progress Note  S: Received page from nursing that patient was considering leaving the hospital tonight, went and discussed with patient. He says that this is a misunderstanding and that he told his gf that, "If I could leave the hospital tonight, I would." He acknowledges that he is too sick to leave and that would not be in his best interest.   He reports that he is too uncomfortable with receiving anesthesia two days in a row (he had I&D on right shoulder today) to get TEE tomorrow. I discussed the importance of TEE to help determine if he has a vegetation, which will help determine length of abx treatment. He said he is willing to do this on another date, which will be Monday since tomorrow is Friday. He is significantly ill that he will likely not be discharged prior to Monday.  He also reports that he was so anxious, sweaty, nervous, and in pain last night prior to surgery that he needed more help than tylenol to sleep and address pain/anxiety.  O: BP 117/88 (BP Location: Left Arm)   Pulse 91   Temp 97.9 F (36.6 C) (Axillary)   Resp 18   Ht 6' 0.01" (1.829 m)   Wt 81.7 kg   SpO2 91%   BMI 24.42 kg/m   Gen: appears older than stated age, WM, NAD, speaking in complete sentences, requires 2L [place] to maintain SpO2 >88%  A/P: 50 yo man here for acute hypoxic respiratory failure 2/2 MRSA bacteremia pna/COPD exacerbation.  #Anxiety/OWS/H/o IVDU/ POD #0 R shoulder I&D: - tylenol 1000 mg q8h - ibuprofen 800 q6h - methadone 100 mg daily (standing order) - Will add tizanidine for muscle spasms - Recommend ice as well for pain and inflammation  #Insomnia - trazodone 100 mg qhs, can consider seroquel if this does not help  Gladys Damme, MD 02/27/2021, 7:51 PM PGY-2, [facility] Service pager [phone]

## 2021-02-27 NOTE — Progress Notes (Signed)
I discussed extensively with Ernest Reese regarding benefit and risk of TEE.  He is aware that they may help guide the duration of his antibiotic therapy and may reveal if there is any infection in the chamber the heart.  He is adamant at this point that he does not wish to proceed with TEE.  I spoke with his nurse, I recommended n.p.o. past midnight in case he change his mind, astomorrow is the last day we do TEE this week since we do not do TEE on the weekend.

## 2021-02-27 NOTE — Progress Notes (Signed)
Family Medicine Teaching Service Daily Progress Note Intern Pager: [phone]  Patient name: Ernest Reese Medical record number: [id number] Date of birth: Nov 17, 1971 Age: 50 y.o. Gender: male  Primary Care Provider: Patient, No Pcp Per Consultants: Ortho, pulm Code Status: Full   Pt Overview and Major Events to Date:  2/28-Admitted  3/3-I&D left shoulder  Assessment and Plan:  Ernest Reese a 50 y.o.malepresenting with acute hypoxic respiratory failure, [name], [name], testicular cancer,anemia, tobacco use, [name]  Acute hypoxic respiratory failure Cavitary lung lesions in setting of MRSA bacteremia pneumonia COPD exacerbation S/p vancomycin (2/28-3/2), Zosyn (2/28-3/2), Clinda (2/28). MRI c spine on 3/2 no evidence of discitis or osteomyelitis -Pulmonology following, appreciate recommendations:   - recommends TEE (pt declines). 4-6 weeks abx to treat potential endocarditis. Repeat  imaging after abx complete to ensure resolution of cavitary  lesions. Continue  bronchodilaters. Complete total 5 days of steroids.  -ID following, appreciate recommendations:  - Continue cefazolin for now, then Oritavancin x 1 followed by 4 weeks of zyvox and close  followup in ID clinic -methylprednisolone 40mg  IV (3/3-) -Respiratory therapy -Albuterol neb Q4H PRN -DuoNebs Q4H -Breo Ellipta 1 puff daily -Robitussin Q4PRN  -Continue Ancef 2 g IV q8h(3/2-)  Shoulder abscess versus hematoma  s/p right shoulder arthroplasty  Pt reports improvement in pain this morning. On exam, left shoulder wound healing well, no bleeding or discharge. Sling in place S/p arthroplasty 2 weeks ago.  CTPA on 2/28- concern for abscess versus hematoma of right shoulder. s/p I&D 3/3 per orthopedics -Ortho following, appreciate continued care and recommendations -Continue Ancef  (3/2-) -Analgesia-Tylenol 1000 mg every 6 hourly, methadone 100mg  daily   Hypomagnesemia Mg 1.4 yesterday am. S/p IV 2g Magnesium sulphate -Check Mag in am   Hypokalemia  K 3.5 this morning -Replete as necessary -Monitor with BMP  Renal insufficiency Cr 1.09 down from 1.49 yesterday. Pending BMP this morning.  Baseline appears to be 1.1 over the last 6 months -Continue N.saline 176ml/hr  -Monitor with BMP -Avoid nephrotoxic agents  Normocytic anemia Hb 8.7, baseline appears to be 12-14. Likely due to chronic disease -Pending CBC am -Continue to monitor   History of heroin use chronic pain  Anxiety -Methadone 100 mg daily - tylenol 1000 mg q8h - ibuprofen 800 q6h - tizanidine for muscle spasms - trazodone 100mg  qhs for insomnia- can consider seroquel if this is not helpful  - Recommend ice as well for pain and inflammation  Tobacco misuse disorder -Nicotine patch 21 mg daily -Encourage smoking cessation   Constipation  -Miralax PRN   FEN/GI: NPO  PPx: Lovenox   Remains inpatient appropriate because:Persistent severe electrolyte disturbances, Ongoing active pain requiring inpatient pain management and Ongoing diagnostic testing needed not appropriate for outpatient work up   Dispo: The patient is from: Home  Anticipated d/c is to: Home  Patient currently is not medically stable to d/c.              Difficult to place patient No  Subjective:  No acute events overnight. Patient endorses feeling a lot better today. Discussed TEE with him and he is adamant that he does not want that done because he doesn't want to go under anesthesia.   Objective: Temp:  [97.1 F (36.2 C)-99.6 F (37.6 C)] 97.9 F (36.6 C) (03/03 1729) Pulse Rate:  [80-99] 91 (03/03 1729) Resp:  [10-19] 18 (03/03 1729) BP: (  117-142)/(71-88) 117/88 (03/03 1729) SpO2:  [90 %-94 %] 91 % (03/03 1729) Weight:  [81.7 kg] 81.7 kg (03/03 1219) Physical  Exam: General: alert, NAD  Cardiovascular: RRR no murmurs  Respiratory: diffuse wheezing throughout lung fields  Abdomen: soft, non distended, non tender Extremities: no edema  Shoulder: L wound healing well without apparent bleeding or discharge.    Laboratory: Recent Labs  Lab 02/26/21 0150 02/26/21 1210 02/27/21 0754  WBC 12.8* 10.0 16.1*  HGB 9.7* 9.8* 10.9*  HCT 30.5* 31.2* 34.0*  PLT 218 224 290   Recent Labs  Lab 02/25/21 0041 02/26/21 1210 02/27/21 0754  NA 143 145 141  K 3.8 3.4* 3.3*  CL 102 106 101  CO2 30 29 25   BUN 30* 33* 19  CREATININE 1.57* 1.49* 1.10  CALCIUM 8.8* 8.8* 8.5*  GLUCOSE 230* 94 81    Imaging/Diagnostic Tests: No results found.  Shary Key, DO 02/27/2021, 8:24 PM PGY-1, [place] Intern pager: [phone], text pages welcome
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
## 2021-02-27 NOTE — Op Note (Signed)
Date of Surgery: 02/27/2021  INDICATIONS: Mr. Sproule is a 50 y.o.-year-old male with a right shoulder superficial infection.  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  He has developed shortness of breath as well as pain in the right shoulder.  He was admitted for COPD exacerbation with pneumonia and bacteremia.  He is also noted to have concerns for right shoulder infection.;  The patient did consent to the procedure after discussion of the risks and benefits.  PREOPERATIVE DIAGNOSIS:  1.  Right shoulder postoperative wound infection  POSTOPERATIVE DIAGNOSIS: Same.  PROCEDURE:  Irrigation and debridement with excisional debridement of skin and subcutaneous tissue, right shoulder.  SURGEON: Geralynn Rile, M.D.  ASSIST: Jonelle Sidle, PA-C  Assistant attestation: PA Mcclung was present for the entire procedure and participated in critical aspects of the case..  ANESTHESIA:  general  IV FLUIDS AND URINE: See anesthesia.  ESTIMATED BLOOD LOSS: 20 mL.  IMPLANTS: None  DRAINS: One Hemovac in right shoulder  COMPLICATIONS: None.  DESCRIPTION OF PROCEDURE: The patient was brought to the operating room and placed supine on the operating table.  The patient had been signed prior to the procedure and this was documented. The patient had the anesthesia placed by the anesthesiologist.  A time-out was performed to confirm that this was the correct patient, site, side and location. The patient did receive antibiotics prior to the incision and was re-dosed during the procedure as needed at indicated intervals.  A tourniquet was not placed.  The patient had the operative extremity prepped and draped in the standard surgical fashion.     We began the procedure by opening his previously utilized deltopectoral incision.  Upon dissecting down through the subcutaneous tissue we encountered copious amounts of purulent material.  There was  approximately 60 cc of purulence that was evacuated.  We then opened up the deltopectoral interval proper.  There was no obvious purulence tracking into the joint.  We did note that the subscapularis was retracted medial to the glenoid.  The sutures previously utilized were excised.  We then began excisional and incisional debridement of this wound.  Utilizing 15 blade knife we excise skin, subcutaneous tissue, muscle and fascia from this wound.  We then lavaged copiously with 3 L of normal saline.  We bluntly dissected through the subpectoralis space, subdeltoid space, and distal subcutaneous space.  No further purulence was encountered.  We did send cultures for microbiology analysis.  Following the obtaining of cultures we did begin his vancomycin infusion.  1 g of vancomycin powder was placed in the wound.  We then placed a Hemovac drain into the deltopectoral subspace.  Wound was then closed with 2-0 PDS for the subcutaneous layer as well as staples for the skin.  The drain was sewn in place with a 3-0 nylon.  Patient was awakened from general anesthetic in stable condition.  All counts were correct x2.  There were no noted complications.  POSTOPERATIVE PLAN:  He will be returned to the medicine service for monitoring of his pulmonary status.  He will maintain nonweightbearing to the right upper extremity in sling at all times.  We will follow up on his intraoperative cultures.  We appreciate the help of infectious disease for duration of antibiotic usage as well as specific antibiotic recommendations.  We will monitor drain output, and plan to pull one daily output is less than 30 cc.

## 2021-02-27 NOTE — Anesthesia Procedure Notes (Signed)
Procedure Name: Intubation Date/Time: 02/27/2021 1:17 PM Performed by: Thelma Comp, CRNA Pre-anesthesia Checklist: Patient identified, Emergency Drugs available, Suction available and Patient being monitored Patient Re-evaluated:Patient Re-evaluated prior to induction Oxygen Delivery Method: Circle System Utilized Preoxygenation: Pre-oxygenation with 100% oxygen Induction Type: IV induction Ventilation: Mask ventilation without difficulty Laryngoscope Size: Mac and 4 Grade View: Grade II Tube type: Oral Tube size: 7.5 mm Number of attempts: 1 Airway Equipment and Method: Stylet Placement Confirmation: ETT inserted through vocal cords under direct vision,  positive ETCO2 and breath sounds checked- equal and bilateral Secured at: 23 cm Tube secured with: Tape Dental Injury: Teeth and Oropharynx as per pre-operative assessment

## 2021-02-27 NOTE — Anesthesia Postprocedure Evaluation (Signed)
Anesthesia Post Note  Patient: Ernest Reese  Procedure(s) Performed: IRRIGATION AND DEBRIDEMENT EXTREMITY (Right Shoulder)     Patient location during evaluation: PACU Anesthesia Type: General Level of consciousness: awake Pain management: pain level controlled Vital Signs Assessment: post-procedure vital signs reviewed and stable Respiratory status: spontaneous breathing Cardiovascular status: stable Postop Assessment: no apparent nausea or vomiting Anesthetic complications: no   No complications documented.  Last Vitals:  Vitals:   02/27/21 1420 02/27/21 1435  BP: 118/75 123/78  Pulse: 92 92  Resp: 15 10  Temp:    SpO2: 94% 93%    Last Pain:  Vitals:   02/27/21 1405  TempSrc:   PainSc: 0-No pain                 Edahi Kroening

## 2021-02-27 NOTE — Progress Notes (Addendum)
Interim Progress Note   S: In to check on patient. He is laying in bed. States he is sleepy. He has no complaints at the moment and jokes around when asked if there is anything we can do for him, stating "not unless you have a lamborghini and a million dollars".    O:  Blood pressure 118/81, pulse 87, temperature 98.4 F (36.9 C), temperature source Oral, resp. rate 18, height 6' 0.01" (1.829 m), weight 81.7 kg, SpO2 91 %. Gen: Asleep but awakens easily, pleasant, in no distress Cardio: RRR without murmur, 2+ radial pulses b/l Lungs: expiratory wheezing diffusely, diffuse rhonchi b/l, good air movement, on 2.5L [place]  Skin: Dressing applied to right shoulder, drain in place MSK: Right arm in sling   A/P:  50 y/o man with PMHx COPD, tobacco use, previous IVDU, admitted for acute hypoxic respiratory failure 2/2 MSSA bacteremia PNA/COPD exacerbation and right shoulder abscess s/p I&D today. He appears comfortable. Drain in place. He continues to have diffuse expiratory wheezing and rhonchi but is maintaining proper O2 saturation on 2.5L [place].  -Continue pain control as previously outlined  -Continue plan as outlined previously

## 2021-02-27 NOTE — H&P (Signed)
H&P update  The surgical history has been reviewed and remains accurate without interval change.  The patient was re-examined and patient's physiologic condition has not changed significantly in the last 30 days. The condition still exists that makes this procedure necessary. The treatment plan remains the same, without new options for care.  No new pharmacological allergies or types of therapy has been initiated that would change the plan or the appropriateness of the plan.  The patient and/or family understand the potential benefits and risks.  Alyne Martinson P. Stann Mainland, MD 02/27/2021 12:04 PM

## 2021-02-27 NOTE — Progress Notes (Signed)
OT Cancellation Note  Patient Details Name: Ernest Reese MRN: [id number] DOB: Mar 20, 1971   Cancelled Treatment:    Reason Eval/Treat Not Completed: Patient at procedure or test/ unavailable.  Pt undergoing I&D.  Nilsa Nutting., OTR/L Acute Rehabilitation Services Pager [phone] Office [phone]   Lucille Passy M 02/27/2021, 2:03 PM

## 2021-02-27 NOTE — Progress Notes (Signed)
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 Will need to r/o endocarditis. tte negative but tee would be more sensitive  cspine tenderness/pain; hx instrumentation there. Will get mri to see to assess  Right knee/tibia/ankle hardware but no tenderness/sign of infection at this time  Hypoxemia/pna likely with mssa.   aki in setting sepsis   02/27/2021 id assessment aki improving bcx repeat ngtd  Stable o2 requirement cspine mri negative for pyogenic involvement Awaiting I&D right shoulder Awaiting tee  Plan: 1. Continue cefazolin 2 gram iv q8hours 2. Await tee 3. Await I&D of right shoulder hemiarthroplasty 4. F/u Repeat bcx     Principal Problem:   Surgical site infection Active Problems:   Renal insufficiency   Acute exacerbation of chronic obstructive airways disease (HCC)   Hx of hepatitis C, reportedly treated   Dyspnea   Tobacco abuse   Polysubstance abuse (HCC)   Recurrent history of leaving AMA   Atherosclerosis of aorta (HCC)   Right lung Multifocal, major fissure, pleural-based fluid areas   Cavitary lesion of right lower lung, superior segment   Ground glass opacity present on imaging of lung   COPD exacerbation (HCC)    Staphylococcus aureus bacteremia   S/P reverse total shoulder arthroplasty, right   Neck pain   Methicillin susceptible S. aureus pyemia (HCC)   Empyema of pleura (HCC)   Infection of superficial incisional surgical site after procedure   Acute exacerbation of chronic obstructive pulmonary disease (COPD) ([place])   Scheduled Meds: . [MAR Hold] acetaminophen  1,000 mg Oral Q6H  . chlorhexidine  60 mL Topical Once  . [MAR Hold] enoxaparin (LOVENOX) injection  40 mg Subcutaneous Q24H  . [MAR Hold] fluticasone furoate-vilanterol  1 puff Inhalation Daily  . [MAR Hold] ipratropium-albuterol  3 mL Nebulization Q4H  . [MAR Hold] mouth rinse  15 mL Mouth Rinse BID  . [MAR Hold] methadone  100 mg Oral Daily  . [MAR Hold] methylPREDNISolone (SOLU-MEDROL) injection  40 mg Intravenous Daily  . [MAR Hold] nicotine  21 mg Transdermal Daily  . povidone-iodine  2 application Topical Once   Continuous Infusions: . sodium chloride 100 mL/hr at 02/27/21 0245  . [MAR Hold]  ceFAZolin (ANCEF) IV 2 g (02/27/21 2637)  .  ceFAZolin (ANCEF) IV    . lactated ringers 10 mL/hr at 02/27/21 1235  . vancomycin     PRN Meds:.[MAR Hold] albuterol, [MAR Hold] cetaphil, [MAR Hold] guaiFENesin-dextromethorphan, [MAR Hold] polyethylene glycol   SUBJECTIVE: Right shoulder pain remains moderate Still posterior neck pain Not particular sob on supplemental oxygen  2 liters only  Await I&D of right shoulder hemiarthroplasty today Reviewed cspine mri with patient --> no obvious pyogenic focus  afebrile No n/v/diarrhea/rash  Wbc up to 16  Review of Systems: ROS Other ros negative  No Known Allergies  OBJECTIVE: Vitals:   02/26/21 2110 02/27/21 0440 02/27/21 0800 02/27/21 1219  BP: (!) 142/87 135/85    Pulse: 80 80    Resp: 19 18    Temp: 97.9 F (36.6 C) 99.6 F (37.6 C)    TempSrc: Oral Axillary    SpO2: 90% 90% 90%   Weight:    81.7 kg  Height:    6' 0.01" (1.829 m)   Body mass index is 24.42  kg/m.  Physical Exam Thin male, mild distress, conversant Heent: normocephalic; per; conj clear; eomi; poor dentition Neck supple Msk: swelling right shoulder and moderate tenderness on periarticular palpation; incision without dehiscence; not much erythema if at all today. Tender cspine area. nontender right ankle/knee and no swelling in respective joints Skin: no rash cv rrr no mrg Pulm: audible exp wheeze but on auscultation mild rhonchi bases otherwise no wheeze; normal respiratory effort; on oxygen supplement Ext no edema Neuro nonfocal Psych alert/oriented  Lab Results Lab Results  Component Value Date   WBC 16.1 (H) 02/27/2021   HGB 10.9 (L) 02/27/2021   HCT 34.0 (L) 02/27/2021   MCV 86.3 02/27/2021   PLT 290 02/27/2021    Lab Results  Component Value Date   CREATININE 1.49 (H) 02/26/2021   BUN 33 (H) 02/26/2021   NA 145 02/26/2021   K 3.4 (L) 02/26/2021   CL 106 02/26/2021   CO2 29 02/26/2021    Lab Results  Component Value Date   ALT 21 02/07/2021   AST 22 02/07/2021   ALKPHOS 82 02/07/2021   BILITOT 0.5 02/07/2021     Microbiology: Recent Results (from the past 240 hour(s))  Resp Panel by RT-PCR (Flu A&B, Covid) Nasopharyngeal Swab     Status: None   Collection Time: 02/24/21 10:14 AM   Specimen: Nasopharyngeal Swab; Nasopharyngeal(NP) swabs in vial transport medium  Result Value Ref Range Status   SARS Coronavirus 2 by RT PCR NEGATIVE NEGATIVE Final    Comment: (NOTE) SARS-CoV-2 target nucleic acids are NOT DETECTED.  The SARS-CoV-2 RNA is generally detectable in upper respiratory specimens during the acute phase of infection. The lowest concentration of SARS-CoV-2 viral copies this assay can detect is 138 copies/mL. A negative result does not preclude SARS-Cov-2 infection and should not be used as the sole basis for treatment or other patient management decisions. A negative result may occur with  improper specimen collection/handling, submission  of specimen other than nasopharyngeal swab, presence of viral mutation(s) within the areas targeted by this assay, and inadequate number of viral copies(<138 copies/mL). A negative result must be combined with clinical observations, patient history, and epidemiological information. The expected result is Negative.  Fact Sheet for Patients:  EntrepreneurPulse.com.au  Fact Sheet for Healthcare Providers:  IncredibleEmployment.be  This test is no t yet approved or cleared by the [place] FDA and  has been authorized for detection and/or diagnosis of SARS-CoV-2 by FDA under an Emergency Use Authorization (EUA). This EUA will remain  in effect (meaning this test can be used) for the duration of the COVID-19 declaration under Section 564(b)(1) of the Act, 21 U.S.C.section 360bbb-3(b)(1), unless the authorization is terminated  or revoked sooner.       Influenza A by PCR NEGATIVE NEGATIVE Final  Influenza B by PCR NEGATIVE NEGATIVE Final    Comment: (NOTE) The Xpert Xpress SARS-CoV-2/FLU/RSV plus assay is intended as an aid in the diagnosis of influenza from Nasopharyngeal swab specimens and should not be used as a sole basis for treatment. Nasal washings and aspirates are unacceptable for Xpert Xpress SARS-CoV-2/FLU/RSV testing.  Fact Sheet for Patients: EntrepreneurPulse.com.au  Fact Sheet for Healthcare Providers: IncredibleEmployment.be  This test is not yet approved or cleared by the [place] FDA and has been authorized for detection and/or diagnosis of SARS-CoV-2 by FDA under an Emergency Use Authorization (EUA). This EUA will remain in effect (meaning this test can be used) for the duration of the COVID-19 declaration under Section 564(b)(1) of the Act, 21 U.S.C. section 360bbb-3(b)(1), unless the authorization is terminated or revoked.  Performed at [facility], [place] [address],  [place], [place] [address]   Blood culture (routine x 2)     Status: Abnormal (Preliminary result)   Collection Time: 02/24/21 10:27 AM   Specimen: BLOOD  Result Value Ref Range Status   Specimen Description BLOOD LEFT ANTECUBITAL  Final   Special Requests   Final    BOTTLES DRAWN AEROBIC AND ANAEROBIC Blood Culture results may not be optimal due to an inadequate volume of blood received in culture bottles   Culture  Setup Time   Final    GRAM POSITIVE COCCI IN CLUSTERS ANAEROBIC BOTTLE ONLY Organism ID to follow CRITICAL RESULT CALLED TO, READ BACK BY AND VERIFIED WITH: J LEDFORD PHARMD [id number] 02/26/21 A BROWNING    Culture (A)  Final    STAPHYLOCOCCUS AUREUS CULTURE REINCUBATED FOR BETTER GROWTH Performed at [facility], [place] [address] [place], [place] [address]    Report Status PENDING  Incomplete  Blood Culture ID Panel (Reflexed)     Status: Abnormal   Collection Time: 02/24/21 10:27 AM  Result Value Ref Range Status   Enterococcus faecalis NOT DETECTED NOT DETECTED Final   Enterococcus Faecium NOT DETECTED NOT DETECTED Final   Listeria monocytogenes NOT DETECTED NOT DETECTED Final   Staphylococcus species DETECTED (A) NOT DETECTED Final    Comment: CRITICAL RESULT CALLED TO, READ BACK BY AND VERIFIED WITH: J LEDFORD PHARMD [id number] 02/26/21 A BROWNING    Staphylococcus aureus (BCID) DETECTED (A) NOT DETECTED Final    Comment: CRITICAL RESULT CALLED TO, READ BACK BY AND VERIFIED WITH: J LEDFORD PHARMD 0105 02/26/21 A BROWNING    Staphylococcus epidermidis NOT DETECTED NOT DETECTED Final   Staphylococcus lugdunensis NOT DETECTED NOT DETECTED Final   Streptococcus species NOT DETECTED NOT DETECTED Final   Streptococcus agalactiae NOT DETECTED NOT DETECTED Final   Streptococcus pneumoniae NOT DETECTED NOT DETECTED Final   Streptococcus pyogenes NOT DETECTED NOT DETECTED Final   A.calcoaceticus-baumannii NOT DETECTED NOT DETECTED Final   Bacteroides fragilis NOT DETECTED NOT  DETECTED Final   Enterobacterales NOT DETECTED NOT DETECTED Final   Enterobacter cloacae complex NOT DETECTED NOT DETECTED Final   Escherichia coli NOT DETECTED NOT DETECTED Final   Klebsiella aerogenes NOT DETECTED NOT DETECTED Final   Klebsiella oxytoca NOT DETECTED NOT DETECTED Final   Klebsiella pneumoniae NOT DETECTED NOT DETECTED Final   Proteus species NOT DETECTED NOT DETECTED Final   Salmonella species NOT DETECTED NOT DETECTED Final   Serratia marcescens NOT DETECTED NOT DETECTED Final   Haemophilus influenzae NOT DETECTED NOT DETECTED Final   Neisseria meningitidis NOT DETECTED NOT DETECTED Final   Pseudomonas aeruginosa NOT DETECTED NOT DETECTED Final  Stenotrophomonas maltophilia NOT DETECTED NOT DETECTED Final   Candida albicans NOT DETECTED NOT DETECTED Final   Candida auris NOT DETECTED NOT DETECTED Final   Candida glabrata NOT DETECTED NOT DETECTED Final   Candida krusei NOT DETECTED NOT DETECTED Final   Candida parapsilosis NOT DETECTED NOT DETECTED Final   Candida tropicalis NOT DETECTED NOT DETECTED Final   Cryptococcus neoformans/gattii NOT DETECTED NOT DETECTED Final   Meth resistant mecA/C and MREJ NOT DETECTED NOT DETECTED Final    Comment: Performed at [facility], [place] [address], [place], [place] [address]  Blood culture (routine x 2)     Status: None (Preliminary result)   Collection Time: 02/24/21 10:32 AM   Specimen: BLOOD RIGHT HAND  Result Value Ref Range Status   Specimen Description BLOOD RIGHT HAND  Final   Special Requests   Final    BOTTLES DRAWN AEROBIC AND ANAEROBIC Blood Culture results may not be optimal due to an inadequate volume of blood received in culture bottles   Culture   Final    NO GROWTH 2 DAYS Performed at [facility], [place] [address], [place], [place] [address]    Report Status PENDING  Incomplete  Expectorated Sputum Assessment w Gram Stain, Rflx to Resp Cult     Status: None   Collection Time: 02/25/21  6:34 PM    Specimen: Expectorated Sputum  Result Value Ref Range Status   Specimen Description EXPECTORATED SPUTUM  Final   Special Requests NONE  Final   Sputum evaluation   Final    Sputum specimen not acceptable for testing.  Please recollect.   RESULT CALLED TO, READ BACK BY AND VERIFIED WITHValeria Batman RN [id number] 02/26/21 A BROWNING Performed at [facility], [place] [address], [place], [place] [address]    Report Status 02/26/2021 FINAL  Final  Culture, blood (Routine X 2) w Reflex to ID Panel     Status: None (Preliminary result)   Collection Time: 02/26/21  8:03 AM   Specimen: BLOOD LEFT HAND  Result Value Ref Range Status   Specimen Description BLOOD LEFT HAND  Final   Special Requests   Final    BOTTLES DRAWN AEROBIC ONLY Blood Culture results may not be optimal due to an inadequate volume of blood received in culture bottles   Culture   Final    NO GROWTH <12 HOURS Performed at [facility], [place] [address], [place], [place] [address]    Report Status PENDING  Incomplete  Culture, blood (Routine X 2) w Reflex to ID Panel     Status: None (Preliminary result)   Collection Time: 02/26/21  8:09 AM   Specimen: BLOOD LEFT FOREARM  Result Value Ref Range Status   Specimen Description BLOOD LEFT FOREARM  Final   Special Requests   Final    BOTTLES DRAWN AEROBIC ONLY Blood Culture results may not be optimal due to an inadequate volume of blood received in culture bottles   Culture   Final    NO GROWTH <12 HOURS Performed at [facility], [place] [address], [place], [place] [address]    Report Status PENDING  Incomplete    Serology:  Imaging: If present, new imagings (plain films, ct scans, and mri) have been personally visualized and interpreted; radiology reports have been reviewed. Decision making incorporated into the Impression / Recommendations.  3/03 cxr Persistent RIGHT mid lung infiltrate consistent with pneumonia.  Mild RIGHT basilar atelectasis  3/02 mri c  spine 1. No acute findings or evidence of discitis or osteomyelitis. 2. Status post C5-6 ACDF. Although there is motion artifact on today's exam, no gross abnormality in the adjacent soft tissues or cord. 3. Mild multilevel spondylosis as described without cord deformity or abnormal cord signal. Right paracentral disc protrusion at C4-5 with resulting mild right foraminal narrowing.  3/01 tte 1. Left ventricular ejection fraction, by estimation, is 60 to 65%. The  left ventricle has normal function. The left ventricle has no regional  wall motion abnormalities. Left ventricular diastolic parameters were  normal.  2. Right ventricular systolic function is normal. The right ventricular  size is normal.  3. The mitral valve is normal in structure. No evidence of mitral valve  regurgitation. No evidence of mitral stenosis.  4. The aortic valve is normal in structure. There is mild calcification  of the aortic valve. Aortic valve regurgitation is mild. Mild aortic valve  sclerosis is present, with no evidence of aortic valve stenosis.  5. Aortic dilatation noted. There is moderate dilatation of the aortic  root, measuring 41 mm.  6. The inferior vena cava is normal in size with greater than 50%  respiratory variability, suggesting right atrial pressure of 3 mmHg.   2/28 cta chest I personally reviewed images; no significant pleural fluid to be drained No evidence of pulmonary emboli.  Multifocal areas of fluid within the right pleural space and fissures with some associated areas of cavitation as well as multifocal parenchymal inflammatory change. Changes were not present in January of 2022 and given their abrupt onset are consistent with multifocal infection/pneumonia.  Postsurgical changes in the proximal right humerus with associated subcutaneous air fluid collection highly suspicious for postoperative abscess along the anterior aspect of the upper right arm and right  pectoralis muscles  Jabier Mutton, [facility] [phone] pager    02/27/2021, 12:58 PM

## 2021-02-27 NOTE — Progress Notes (Signed)
Physical Therapy Treatment Patient Details Name: Ernest Reese MRN: [id number] DOB: 11-23-1971 Today's Date: 02/27/2021    History of Present Illness Pt is a 50 y.o. male admitted 02/24/21 with SOB and shoulder pain; pt s/p R shoulder hemi-arthroplasty (02/11/21 with Dr. Stann Mainland), now with surgical site cellulitis. Plan for R shoulder I&D 3/3. PMH includes COPD, anemia, testicular CA, tobacco use.   PT Comments    Pt received with increased lethargy, falling asleep while sitting up at EOB, WB through R shoulder with bilateral elbows propped up on tray table. Assist to don sling and reposition to encourage neutral alignment in supine; pt impulsive with movement requiring frequent cues for safety and RUE NWB. SpO2 88% on RA, up to 90% on 2L O2 [place]; noted wheezing (RN present and aware). Hopeful pt's functional status improved s/p R shoulder I&D this afternoon. Will continue to follow acutely.   Follow Up Recommendations  Outpatient PT (ortho for post-op TSA)     Equipment Recommendations  None recommended by PT    Recommendations for Other Services       Precautions / Restrictions Precautions Precautions: Fall;Other (comment) Required Braces or Orthoses: Sling Restrictions Weight Bearing Restrictions: Yes RUE Weight Bearing: Non weight bearing Other Position/Activity Restrictions: Ortho PA note - no R shoulder ER past 0; should be in sling majority of the time except to move elbow, wrist, digits    Mobility  Bed Mobility Overal bed mobility: Modified Independent             General bed mobility comments: Mod indep supine<>sit multiple times with head elevated; pt encouraged to lay back down for safety (declined transfer to recliner), then impulsively coming to long sit multiple times for repositioning with out assist    Transfers                 General transfer comment: Pt declined transfer to recliner; pt performing partial sit<>stand for repositioning with min guard  due to lethargy  Ambulation/Gait                 Stairs             Wheelchair Mobility    Modified Rankin (Stroke Patients Only)       Balance Overall balance assessment: Needs assistance Sitting-balance support: No upper extremity supported;Feet supported Sitting balance-Leahy Scale: Fair Sitting balance - Comments: Balance impaired by lethargy this session                                    Cognition Arousal/Alertness: Lethargic;Suspect due to medications Behavior During Therapy: Impulsive;Flat affect Overall Cognitive Status: Impaired/Different from baseline                                 General Comments: Pt received falling asleep sitting up at EOB; will wake to voice/stimulation, but then falling back asleep; following simple commands when awake; no teach back of education, does not seem to register it      Exercises Shoulder Exercises Elbow Flexion: PROM;Right Elbow Extension: PROM;Right Wrist Flexion: AROM;Right Wrist Extension: AROM;Right Digit Composite Flexion: AROM;Right (partial range) Composite Extension: AROM;Right (partial range)    General Comments General comments (skin integrity, edema, etc.): Pt received with bilateral elbows propped up on tray table while seated EOB; reeducated on importance of RUE NWB and sling wear (on except for  elbow/hand ROM); maxA to don sling. SpO2 88% on RA, up to 90% on 2L O2 [place]; HR 109      Pertinent Vitals/Pain Pain Assessment: Faces Faces Pain Scale: Hurts whole lot Pain Location: R shoulder Pain Descriptors / Indicators: Grimacing;Guarding Pain Intervention(s): Monitored during session;Repositioned;Other (comment);RN gave pain meds during session (sling applied)    Home Living                      Prior Function            PT Goals (current goals can now be found in the care plan section) Progress towards PT goals: Not progressing toward goals - comment  (lethargic, not following educ; awaiting I&D today)    Frequency    Min 3X/week      PT Plan Current plan remains appropriate    Co-evaluation              AM-PAC PT "6 Clicks" Mobility   Outcome Measure  Help needed turning from your back to your side while in a flat bed without using bedrails?: None Help needed moving from lying on your back to sitting on the side of a flat bed without using bedrails?: None Help needed moving to and from a bed to a chair (including a wheelchair)?: A Little Help needed standing up from a chair using your arms (e.g., wheelchair or bedside chair)?: A Little Help needed to walk in hospital room?: A Little Help needed climbing 3-5 steps with a railing? : A Little 6 Click Score: 20    End of Session   Activity Tolerance: Patient limited by lethargy Patient left: in bed;with call bell/phone within reach;with bed alarm set Nurse Communication: Mobility status PT Visit Diagnosis: Other abnormalities of gait and mobility (R26.89);Pain Pain - Right/Left: Right Pain - part of body: Shoulder     Time: 3846-6599 PT Time Calculation (min) (ACUTE ONLY): 12 min  Charges:  $Therapeutic Activity: 8-22 mins                     Mabeline Caras, PT, DPT Acute Rehabilitation Services  Pager [phone] Office [place] 02/27/2021, 9:05 AM

## 2021-02-27 NOTE — Anesthesia Preprocedure Evaluation (Signed)
Anesthesia Evaluation  Patient identified by MRN, date of birth, ID band Patient awake    Reviewed: Allergy & Precautions, NPO status , Patient's Chart, lab work & pertinent test results  Airway Mallampati: II  TM Distance: >3 FB     Dental   Pulmonary shortness of breath, COPD, Current Smoker and Patient abstained from smoking.,    breath sounds clear to auscultation       Cardiovascular negative cardio ROS   Rhythm:Regular Rate:Normal     Neuro/Psych    GI/Hepatic negative GI ROS, (+) Hepatitis -  Endo/Other    Renal/GU Renal disease     Musculoskeletal   Abdominal   Peds  Hematology  (+) anemia ,   Anesthesia Other Findings   Reproductive/Obstetrics                             Anesthesia Physical Anesthesia Plan  ASA: III  Anesthesia Plan: General   Post-op Pain Management:    Induction: Intravenous  PONV Risk Score and Plan: 2 and Ondansetron, Dexamethasone and Midazolam  Airway Management Planned: LMA  Additional Equipment:   Intra-op Plan:   Post-operative Plan: Extubation in OR  Informed Consent: I have reviewed the patients History and Physical, chart, labs and discussed the procedure including the risks, benefits and alternatives for the proposed anesthesia with the patient or authorized representative who has indicated his/her understanding and acceptance.     Dental advisory given  Plan Discussed with: CRNA and Anesthesiologist  Anesthesia Plan Comments:         Anesthesia Quick Evaluation

## 2021-02-27 NOTE — Brief Op Note (Signed)
02/24/2021 - 02/27/2021  1:47 PM  PATIENT:  Ernest Reese  50 y.o. male  PRE-OPERATIVE DIAGNOSIS:  Right shoulder abscess  POST-OPERATIVE DIAGNOSIS:  Right shoulder abscess  PROCEDURE:  Procedure(s): IRRIGATION AND DEBRIDEMENT EXTREMITY (Right)  SURGEON:  Surgeon(s) and Role:    * Nicholes Stairs, MD - Primary  PHYSICIAN ASSISTANT: Jonelle Sidle, PA-C   ANESTHESIA:   general  EBL: 20 cc  BLOOD ADMINISTERED:none  DRAINS: (1) Hemovact drain(s) in the right shoulder with  Suction Open   LOCAL MEDICATIONS USED:  NONE  SPECIMEN:  Source of Specimen:  right shoulder superficial  DISPOSITION OF SPECIMEN:  microbiology  COUNTS:  YES  TOURNIQUET:  * No tourniquets in log *  DICTATION: .Note written in EPIC  PLAN OF CARE: Admit to inpatient   PATIENT DISPOSITION:  PACU - hemodynamically stable.   Delay start of Pharmacological VTE agent (>24hrs) due to surgical blood loss or risk of bleeding: not applicable

## 2021-02-28 ENCOUNTER — Other Ambulatory Visit: Payer: Self-pay | Admitting: Family Medicine

## 2021-02-28 ENCOUNTER — Encounter (HOSPITAL_COMMUNITY): Payer: Self-pay | Admitting: Orthopedic Surgery

## 2021-02-28 DIAGNOSIS — J984 Other disorders of lung: Secondary | ICD-10-CM | POA: Diagnosis not present

## 2021-02-28 DIAGNOSIS — T8141XD Infection following a procedure, superficial incisional surgical site, subsequent encounter: Secondary | ICD-10-CM

## 2021-02-28 DIAGNOSIS — J441 Chronic obstructive pulmonary disease with (acute) exacerbation: Secondary | ICD-10-CM | POA: Diagnosis not present

## 2021-02-28 DIAGNOSIS — Z8619 Personal history of other infectious and parasitic diseases: Secondary | ICD-10-CM

## 2021-02-28 DIAGNOSIS — A4101 Sepsis due to Methicillin susceptible Staphylococcus aureus: Secondary | ICD-10-CM

## 2021-02-28 DIAGNOSIS — T8149XA Infection following a procedure, other surgical site, initial encounter: Secondary | ICD-10-CM | POA: Diagnosis not present

## 2021-02-28 DIAGNOSIS — R918 Other nonspecific abnormal finding of lung field: Secondary | ICD-10-CM

## 2021-02-28 DIAGNOSIS — Z5329 Procedure and treatment not carried out because of patient's decision for other reasons: Secondary | ICD-10-CM

## 2021-02-28 DIAGNOSIS — J869 Pyothorax without fistula: Secondary | ICD-10-CM | POA: Diagnosis not present

## 2021-02-28 DIAGNOSIS — I700 Atherosclerosis of aorta: Secondary | ICD-10-CM

## 2021-02-28 DIAGNOSIS — J949 Pleural condition, unspecified: Secondary | ICD-10-CM

## 2021-02-28 DIAGNOSIS — R7881 Bacteremia: Secondary | ICD-10-CM | POA: Diagnosis not present

## 2021-02-28 LAB — BASIC METABOLIC PANEL
Anion gap: 8 (ref 5–15)
BUN: 17 mg/dL (ref 6–20)
CO2: 30 mmol/L (ref 22–32)
Calcium: 7.8 mg/dL — ABNORMAL LOW (ref 8.9–10.3)
Chloride: 101 mmol/L (ref 98–111)
Creatinine, Ser: 1.09 mg/dL (ref 0.61–1.24)
GFR, Estimated: >60 mL/min (ref >60)
Glucose, Bld: 154 mg/dL — ABNORMAL HIGH (ref 70–99)
Potassium: 3.5 mmol/L (ref 3.5–5.1)
Sodium: 139 mmol/L (ref 135–145)

## 2021-02-28 MED ORDER — IPRATROPIUM-ALBUTEROL 0.5-2.5 (3) MG/3ML IN SOLN
3.0000 mL | Freq: Three times a day (TID) | RESPIRATORY_TRACT | Status: DC
  Administered 2021-02-28 – 2021-03-01 (×2): 3 mL via RESPIRATORY_TRACT
  Filled 2021-02-28 (×2): qty 3

## 2021-02-28 MED ORDER — NICOTINE 21 MG/24HR TD PT24: 21.0000 mg | MEDICATED_PATCH | Freq: Once | TRANSDERMAL | Status: DC

## 2021-02-28 MED ORDER — LINEZOLID 600 MG PO TABS: 600.0000 mg | ORAL_TABLET | Freq: Two times a day (BID) | ORAL | 1 refills | Status: DC

## 2021-02-28 NOTE — Progress Notes (Signed)
Physical Therapy Treatment Patient Details Name: Ernest Reese MRN: [id number] DOB: 02-27-1971 Today's Date: 02/28/2021    History of Present Illness Pt is a 50 y.o. male admitted 02/24/21 with SOB and shoulder pain; pt s/p R shoulder hemi-arthroplasty (02/11/21 with Dr. Stann Mainland), now with surgical site cellulitis. Plan for R shoulder I&D 3/3. Pt declining recommendation for TEE during admission. PMH includes COPD, anemia, testicular CA, tobacco use.   PT Comments    Pt with much improved alertness this session. Able to mobilize well throughout room and hallway without overt instability or LOB. Pt still with poor awareness of RUE NWB precautions, correct sling wear and ROM restrictions. Pt adamantly declining TEE despite encouragement/education from this writer and MD. If to remain admitted, will continue to follow acutely.   Follow Up Recommendations  Outpatient PT     Equipment Recommendations  None recommended by PT    Recommendations for Other Services       Precautions / Restrictions Precautions Precautions: Fall;Other (comment);Shoulder Type of Shoulder Precautions: R hemiarthroplasty Shoulder Interventions: Shoulder sling/immobilizer;At all times;Off for dressing/bathing/exercises Precaution Booklet Issued: Yes (comment) Precaution Comments: monitor O2, no R external rotation beyond 0* Required Braces or Orthoses: Sling Restrictions Weight Bearing Restrictions: Yes RUE Weight Bearing: Non weight bearing Other Position/Activity Restrictions: 3/3 ortho op note - NWB in sling at all times, no external rotation beyond 0*    Mobility  Bed Mobility Overal bed mobility: Modified Independent                  Transfers Overall transfer level: Independent Equipment used: None Transfers: Sit to/from Stand Sit to Stand: Supervision         General transfer comment: Independent for standing at bedside to ensure carryover of not pushing through R  UE  Ambulation/Gait Ambulation/Gait assistance: Supervision Gait Distance (Feet): 200 Feet Assistive device: IV Pole;None Gait Pattern/deviations: Step-through pattern;Decreased stride length   Gait velocity interpretation: >2.62 ft/sec, indicative of community ambulatory General Gait Details: Fast, steady gait with and without pushing IV pole; no overt instability or LOB noted with higher level balance tasks during ambulation. Cues to relax RUE into sling   Stairs             Wheelchair Mobility    Modified Rankin (Stroke Patients Only)       Balance Overall balance assessment: Needs assistance Sitting-balance support: No upper extremity supported;Feet supported Sitting balance-Leahy Scale: Good     Standing balance support: No upper extremity supported;During functional activity Standing balance-Leahy Scale: Good                              Cognition Arousal/Alertness: Awake/alert Behavior During Therapy: WFL for tasks assessed/performed;Impulsive Overall Cognitive Status: No family/caregiver present to determine baseline cognitive functioning Area of Impairment: Safety/judgement;Awareness                         Safety/Judgement: Decreased awareness of safety;Decreased awareness of deficits Awareness: Emergent   General Comments: poor safety/implementation of shoulder precautions. though able to verbally recall them, does not always implement them during tasks      Exercises Shoulder Exercises Elbow Flexion: AROM;Right;10 reps;Seated Elbow Extension: AROM;Right;Seated;10 reps Wrist Flexion: AROM;Right;10 reps;Seated Wrist Extension: AROM;Right;10 reps;Seated Digit Composite Flexion: AROM;Right;10 reps;Seated Composite Extension: AROM;Right;10 reps;Seated    General Comments General comments (skin integrity, edema, etc.): Pt with sling not properly donned on entry, assisted in repositioning and  educating on shoulder precautions. Pt  with O2 doffed on entry, unable to obtain O2 reading with personal pulse ox      Pertinent Vitals/Pain Pain Assessment: Faces Faces Pain Scale: Hurts a little bit Pain Location: R shoulder Pain Descriptors / Indicators: Grimacing;Guarding Pain Intervention(s): Monitored during session    Home Living                      Prior Function            PT Goals (current goals can now be found in the care plan section) Acute Rehab PT Goals Patient Stated Goal: go home Progress towards PT goals: Progressing toward goals    Frequency    Min 3X/week      PT Plan Current plan remains appropriate    Co-evaluation              AM-PAC PT "6 Clicks" Mobility   Outcome Measure  Help needed turning from your back to your side while in a flat bed without using bedrails?: None Help needed moving from lying on your back to sitting on the side of a flat bed without using bedrails?: None Help needed moving to and from a bed to a chair (including a wheelchair)?: A Little Help needed standing up from a chair using your arms (e.g., wheelchair or bedside chair)?: A Little Help needed to walk in hospital room?: A Little Help needed climbing 3-5 steps with a railing? : A Little 6 Click Score: 20    End of Session   Activity Tolerance: Patient tolerated treatment well Patient left: in chair;with call bell/phone within reach;with nursing/sitter in room Nurse Communication: Mobility status PT Visit Diagnosis: Other abnormalities of gait and mobility (R26.89);Pain Pain - Right/Left: Right Pain - part of body: Shoulder     Time: 4098-1191 PT Time Calculation (min) (ACUTE ONLY): 24 min  Charges:  $Gait Training: 8-22 mins $Self Care/Home Management: 8-22                     Mabeline Caras, PT, DPT Acute Rehabilitation Services  Pager [phone] Office [place] 02/28/2021, 10:50 AM

## 2021-02-28 NOTE — Progress Notes (Signed)
Subjective: Ernest Reese is a 50 year old male s/p right shoulder I&D 02/27/21, s/p right shoulder hemiarthroplasty on 02/11/21 Patient reports pain as mild to moderate.  Reports improved pain compared to yesterday, feels that he is breathing fine. Overall is doing well.   He reports he refused his TEE because he did not want to do that. He wants to go home by Monday. He denies numbness or tingling, denies fever or chills. He has stayed in his sling.      Date of Surgery: 02/27/2021   INDICATIONS: Ernest Reese is a 50 y.o.-year-old male with a right shoulder superficial infection.  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  He has developed shortness of breath as well as pain in the right shoulder.  He was admitted for COPD exacerbation with pneumonia and bacteremia.  He is also noted to have concerns for right shoulder infection.;  The patient did consent to the procedure after discussion of the risks and benefits.   PREOPERATIVE DIAGNOSIS:  1.  Right shoulder postoperative wound infection   POSTOPERATIVE DIAGNOSIS: Same.   PROCEDURE:  Irrigation and debridement with excisional debridement of skin and subcutaneous tissue, right shoulder.   SURGEON: Geralynn Rile, M.D. Objective:   VITALS:   Vitals:   02/28/21 0600 02/28/21 0700 02/28/21 1149 02/28/21 1203  BP: 112/64 104/70 128/76   Pulse:   80   Resp:   20   Temp:   98.8 F (37.1 C)   TempSrc:   Oral   SpO2:   99% 94%  Weight:      Height:        Right Upper Extremity:   INSPECTION & PALPATION: Clean, dry intact bandage of adaptic, 4x4, ABD taped to right shoulder, intact drain on the lateral deltoid with bloody drainage noted in reservoir.    SENSORY: sensation is intact to light touch in:  superficial radial nerve distribution (dorsal first web space) median nerve distribution (tip of index finger)  - slight decrease of index finger sensation - reports it has  been like this since before surgery ulnar nerve distribution (tip of small finger)    Axillary nerve distribution (lateral shoulder)   ROM: full active rom of the right elbow, wrist, and digits   MOTOR:  + motor posterior interosseous nerve (thumb IP extension) + anterior interosseous nerve (thumb IP flexion, index finger DIP flexion) + radial nerve (wrist extension) + median nerve (palpable firing thenar mass) + ulnar nerve (palpable firing of first dorsal interosseous muscle) + axiallry nerve (palpable firing of deltoid)   VASCULAR: 2+ radial pulse, brisk capillary refill < 2 sec, fingers warm and well-perfused     Lab Results  Component Value Date   WBC 12.8 (H) 02/28/2021   HGB 8.7 (L) 02/28/2021   HCT 28.2 (L) 02/28/2021   MCV 88.1 02/28/2021   PLT 253 02/28/2021   BMET    Component Value Date/Time   NA 139 02/28/2021 0220   K 3.5 02/28/2021 0220   CL 101 02/28/2021 0220   CO2 30 02/28/2021 0220   GLUCOSE 154 (H) 02/28/2021 0220   BUN 17 02/28/2021 0220   CREATININE 1.09 02/28/2021 0220   CALCIUM 7.8 (L) 02/28/2021 0220   GFRNONAA >60 02/28/2021 0220   GFRAA >60 08/21/2020 1312     Assessment/Plan: 1 Day Post-Op   Principal Problem:   Surgical site infection Active Problems:   Renal insufficiency   Acute  exacerbation of chronic obstructive airways disease (HCC)   Hx of hepatitis C, reportedly treated   Dyspnea   Tobacco abuse   Polysubstance abuse (HCC)   Recurrent history of leaving AMA   Atherosclerosis of aorta (HCC)   Right lung Multifocal, major fissure, pleural-based fluid areas   Cavitary lesion of right lower lung, superior segment   Ground glass opacity present on imaging of lung   COPD exacerbation (HCC)   Staphylococcus aureus bacteremia   S/P reverse total shoulder arthroplasty, right   Neck pain   Methicillin susceptible S. aureus pyemia (HCC)   Empyema of pleura (HCC)   Infection of superficial incisional surgical site after  procedure   Acute exacerbation of chronic obstructive pulmonary disease (COPD) ([place])   Advance diet Up with therapy -D/C per primary and consulting teams  -Will follow cultures, shows GPC for preliminary, appreciate Infectious disease assistance with treatment per Dr. Derek Mound note will plan for  "Plan will be cefazolin for now, Then Oritavancin x 1 followed by 4 weeks of zyvox and close followup in ID clinic"  -Pulmonary team recommended 4-6 weeks of antibiotics for cavitary lung lesions,  5 days of steroids.   -Cardiology recommended TEE to evaluate for endocarditis, patient refused .     Sutured in Drain will remain in until less than 30cc of drainage per day.  Bandage to stay on until drain removal unless saturated - if saturated can change with nonadherent dressing, 4x4, ABD, tape.  Weightbearing Status: NWB to the RUE in the sling at all times, okay to work on elbow, wrist, digit ROM.   Follow up with [facility] 1-2  weeks s/p I&D for wound check and re-evaluation with Dr. Stann Mainland.   Faythe Casa 02/28/2021, 5:28 PM  Jonelle Sidle PA-C  Physician Assistant with Dr. Lillia Abed [place]
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
## 2021-02-28 NOTE — Progress Notes (Addendum)
Subjective: No new complaints   Antibiotics:  Anti-infectives (From admission, onward)   Start     Dose/Rate Route Frequency Ordered Stop   02/27/21 1326  vancomycin (VANCOCIN) powder  Status:  Discontinued          As needed 02/27/21 1332 02/27/21 1359   02/27/21 1245  vancomycin (VANCOCIN) IVPB 1000 mg/200 mL premix  Status:  Discontinued        1,000 mg 200 mL/hr over 60 Minutes Intravenous To Surgery 02/27/21 1231 02/27/21 1514   02/27/21 0600  ceFAZolin (ANCEF) IVPB 2g/100 mL premix  Status:  Discontinued        2 g 200 mL/hr over 30 Minutes Intravenous On call to O.R. 02/26/21 1622 02/27/21 1514   02/27/21 0400  vancomycin (VANCOREADY) IVPB 1000 mg/200 mL  Status:  Discontinued        1,000 mg 200 mL/hr over 60 Minutes Intravenous To Surgery 02/26/21 1427 02/27/21 1232   02/26/21 1400  ceFAZolin (ANCEF) IVPB 2g/100 mL premix        2 g 200 mL/hr over 30 Minutes Intravenous Every 8 hours 02/26/21 1109     02/24/21 2030  vancomycin (VANCOREADY) IVPB 1750 mg/350 mL  Status:  Discontinued        1,750 mg 175 mL/hr over 120 Minutes Intravenous Every 24 hours 02/24/21 1937 02/26/21 0719   02/24/21 2030  piperacillin-tazobactam (ZOSYN) IVPB 3.375 g  Status:  Discontinued        3.375 g 12.5 mL/hr over 240 Minutes Intravenous Every 8 hours 02/24/21 1937 02/26/21 0719   02/24/21 1515  clindamycin (CLEOCIN) IVPB 600 mg        600 mg 100 mL/hr over 30 Minutes Intravenous  Once 02/24/21 1504 02/24/21 1749      Medications: Scheduled Meds: . acetaminophen  1,000 mg Oral Q6H  . docusate sodium  100 mg Oral BID  . enoxaparin (LOVENOX) injection  40 mg Subcutaneous Q24H  . fluticasone furoate-vilanterol  1 puff Inhalation Daily  . ibuprofen  800 mg Oral Q8H  . ipratropium-albuterol  3 mL Inhalation TID  . mouth rinse  15 mL Mouth Rinse BID  . methadone  100 mg Oral Daily  . methylPREDNISolone (SOLU-MEDROL) injection  40 mg Intravenous Daily  . nicotine  21 mg  Transdermal Daily  . traZODone  100 mg Oral QHS   Continuous Infusions: . sodium chloride 100 mL (02/28/21 1255)  .  ceFAZolin (ANCEF) IV 2 g (02/28/21 1311)   PRN Meds:.albuterol, cetaphil, guaiFENesin-dextromethorphan, metoCLOPramide **OR** metoCLOPramide (REGLAN) injection, ondansetron **OR** ondansetron (ZOFRAN) IV, polyethylene glycol, tiZANidine    Objective: Weight change:   Intake/Output Summary (Last 24 hours) at 02/28/2021 1440 Last data filed at 02/28/2021 0800 Gross per 24 hour  Intake 979.69 ml  Output 1000 ml  Net -20.31 ml   Blood pressure 128/76, pulse 80, temperature 98.8 F (37.1 C), temperature source Oral, resp. rate 20, height 6' 0.01" (1.829 m), weight 81.7 kg, SpO2 94 %. Temp:  [97.4 F (36.3 C)-98.8 F (37.1 C)] 98.8 F (37.1 C) (03/04 1149) Pulse Rate:  [80-98] 80 (03/04 1149) Resp:  [13-20] 20 (03/04 1149) BP: (95-128)/(60-88) 128/76 (03/04 1149) SpO2:  [91 %-100 %] 94 % (03/04 1203)  Physical Exam: Physical Exam Constitutional:      Appearance: He is well-developed and well-nourished.  HENT:     Head: Normocephalic and atraumatic.  Eyes:     Extraocular Movements: Extraocular movements intact and EOM normal.  Conjunctiva/sclera: Conjunctivae normal.  Cardiovascular:     Rate and Rhythm: Normal rate and regular rhythm.  Pulmonary:     Effort: Pulmonary effort is normal. No respiratory distress.     Breath sounds: No wheezing.  Musculoskeletal:        General: No edema.     Cervical back: Normal range of motion and neck supple.  Skin:    General: Skin is warm and dry.     Findings: No rash.  Neurological:     General: No focal deficit present.     Mental Status: He is alert and oriented to person, place, and time.  Psychiatric:        Mood and Affect: Mood and affect and mood normal.        Behavior: Behavior normal.        Thought Content: Thought content normal.        Judgment: Judgment normal.     Shoulder in  sling  CBC:    BMET Recent Labs    02/27/21 0754 02/28/21 0220  NA 141 139  K 3.3* 3.5  CL 101 101  CO2 25 30  GLUCOSE 81 154*  BUN 19 17  CREATININE 1.10 1.09  CALCIUM 8.5* 7.8*     Liver Panel  No results for input(s): PROT, ALBUMIN, AST, ALT, ALKPHOS, BILITOT, BILIDIR, IBILI in the last 72 hours.     Sedimentation Rate No results for input(s): ESRSEDRATE in the last 72 hours. C-Reactive Protein No results for input(s): CRP in the last 72 hours.  Micro Results: Recent Results (from the past 720 hour(s))  Surgical pcr screen     Status: Abnormal   Collection Time: 02/07/21  9:40 AM   Specimen: Nasal Mucosa; Nasal Swab  Result Value Ref Range Status   MRSA, PCR POSITIVE (A) NEGATIVE Final    Comment: RESULT CALLED TO, READ BACK BY AND VERIFIED WITH: RN ciara c. [phone] fcp    Staphylococcus aureus POSITIVE (A) NEGATIVE Final    Comment: (NOTE) The Xpert SA Assay (FDA approved for NASAL specimens in patients 42 years of age and older), is one component of a comprehensive surveillance program. It is not intended to diagnose infection nor to guide or monitor treatment. Performed at [facility], [place] [address] [place], [place] [address]   SARS CORONAVIRUS 2 (TAT 6-24 HRS) Nasopharyngeal Nasopharyngeal Swab     Status: None   Collection Time: 02/07/21 11:05 AM   Specimen: Nasopharyngeal Swab  Result Value Ref Range Status   SARS Coronavirus 2 NEGATIVE NEGATIVE Final    Comment: (NOTE) SARS-CoV-2 target nucleic acids are NOT DETECTED.  The SARS-CoV-2 RNA is generally detectable in upper and lower respiratory specimens during the acute phase of infection. Negative results do not preclude SARS-CoV-2 infection, do not rule out co-infections with other pathogens, and should not be used as the sole basis for treatment or other patient management decisions. Negative results must be combined with clinical observations, patient history, and  epidemiological information. The expected result is Negative.  Fact Sheet for Patients: SugarRoll.be  Fact Sheet for Healthcare Providers: https://www.woods-mathews.com/  This test is not yet approved or cleared by the [place] FDA and  has been authorized for detection and/or diagnosis of SARS-CoV-2 by FDA under an Emergency Use Authorization (EUA). This EUA will remain  in effect (meaning this test can be used) for the duration of the COVID-19 declaration under Se ction 564(b)(1) of the Act, 21 U.S.C. section 360bbb-3(b)(1),  unless the authorization is terminated or revoked sooner.  Performed at [facility], [place] [address] [place], [place] [address]   Resp Panel by RT-PCR (Flu A&B, Covid) Nasopharyngeal Swab     Status: None   Collection Time: 02/24/21 10:14 AM   Specimen: Nasopharyngeal Swab; Nasopharyngeal(NP) swabs in vial transport medium  Result Value Ref Range Status   SARS Coronavirus 2 by RT PCR NEGATIVE NEGATIVE Final    Comment: (NOTE) SARS-CoV-2 target nucleic acids are NOT DETECTED.  The SARS-CoV-2 RNA is generally detectable in upper respiratory specimens during the acute phase of infection. The lowest concentration of SARS-CoV-2 viral copies this assay can detect is 138 copies/mL. A negative result does not preclude SARS-Cov-2 infection and should not be used as the sole basis for treatment or other patient management decisions. A negative result may occur with  improper specimen collection/handling, submission of specimen other than nasopharyngeal swab, presence of viral mutation(s) within the areas targeted by this assay, and inadequate number of viral copies(<138 copies/mL). A negative result must be combined with clinical observations, patient history, and epidemiological information. The expected result is Negative.  Fact Sheet for Patients:  EntrepreneurPulse.com.au  Fact Sheet for  Healthcare Providers:  IncredibleEmployment.be  This test is no t yet approved or cleared by the [place] FDA and  has been authorized for detection and/or diagnosis of SARS-CoV-2 by FDA under an Emergency Use Authorization (EUA). This EUA will remain  in effect (meaning this test can be used) for the duration of the COVID-19 declaration under Section 564(b)(1) of the Act, 21 U.S.C.section 360bbb-3(b)(1), unless the authorization is terminated  or revoked sooner.       Influenza A by PCR NEGATIVE NEGATIVE Final   Influenza B by PCR NEGATIVE NEGATIVE Final    Comment: (NOTE) The Xpert Xpress SARS-CoV-2/FLU/RSV plus assay is intended as an aid in the diagnosis of influenza from Nasopharyngeal swab specimens and should not be used as a sole basis for treatment. Nasal washings and aspirates are unacceptable for Xpert Xpress SARS-CoV-2/FLU/RSV testing.  Fact Sheet for Patients: EntrepreneurPulse.com.au  Fact Sheet for Healthcare Providers: IncredibleEmployment.be  This test is not yet approved or cleared by the [place] FDA and has been authorized for detection and/or diagnosis of SARS-CoV-2 by FDA under an Emergency Use Authorization (EUA). This EUA will remain in effect (meaning this test can be used) for the duration of the COVID-19 declaration under Section 564(b)(1) of the Act, 21 U.S.C. section 360bbb-3(b)(1), unless the authorization is terminated or revoked.  Performed at [facility], [place] [address] [place], [place] [address]   Blood culture (routine x 2)     Status: Abnormal (Preliminary result)   Collection Time: 02/24/21 10:27 AM   Specimen: BLOOD  Result Value Ref Range Status   Specimen Description BLOOD LEFT ANTECUBITAL  Final   Special Requests   Final    BOTTLES DRAWN AEROBIC AND ANAEROBIC Blood Culture results may not be optimal due to an inadequate volume of blood received in culture  bottles   Culture  Setup Time   Final    GRAM POSITIVE COCCI IN CLUSTERS ANAEROBIC BOTTLE ONLY Organism ID to follow CRITICAL RESULT CALLED TO, READ BACK BY AND VERIFIED WITH: J LEDFORD PHARMD [id number] 02/26/21 A BROWNING    Culture (A)  Final    STAPHYLOCOCCUS AUREUS CULTURE REINCUBATED FOR BETTER GROWTH Performed at [facility], [place] [address] [place], [place] [address]    Report Status PENDING  Incomplete  Blood Culture ID Panel (Reflexed)     Status: Abnormal   Collection Time: 02/24/21 10:27 AM  Result Value Ref Range Status   Enterococcus faecalis NOT DETECTED NOT DETECTED Final   Enterococcus Faecium NOT DETECTED NOT DETECTED Final   Listeria monocytogenes NOT DETECTED NOT DETECTED Final   Staphylococcus species DETECTED (A) NOT DETECTED Final    Comment: CRITICAL RESULT CALLED TO, READ BACK BY AND VERIFIED WITH: J LEDFORD PHARMD 0105 02/26/21 A BROWNING    Staphylococcus aureus (BCID) DETECTED (A) NOT DETECTED Final    Comment: CRITICAL RESULT CALLED TO, READ BACK BY AND VERIFIED WITH: J LEDFORD PHARMD 0105 02/26/21 A BROWNING    Staphylococcus epidermidis NOT DETECTED NOT DETECTED Final   Staphylococcus lugdunensis NOT DETECTED NOT DETECTED Final   Streptococcus species NOT DETECTED NOT DETECTED Final   Streptococcus agalactiae NOT DETECTED NOT DETECTED Final   Streptococcus pneumoniae NOT DETECTED NOT DETECTED Final   Streptococcus pyogenes NOT DETECTED NOT DETECTED Final   A.calcoaceticus-baumannii NOT DETECTED NOT DETECTED Final   Bacteroides fragilis NOT DETECTED NOT DETECTED Final   Enterobacterales NOT DETECTED NOT DETECTED Final   Enterobacter cloacae complex NOT DETECTED NOT DETECTED Final   Escherichia coli NOT DETECTED NOT DETECTED Final   Klebsiella aerogenes NOT DETECTED NOT DETECTED Final   Klebsiella oxytoca NOT DETECTED NOT DETECTED Final   Klebsiella pneumoniae NOT DETECTED NOT DETECTED Final   Proteus species NOT DETECTED NOT DETECTED Final    Salmonella species NOT DETECTED NOT DETECTED Final   Serratia marcescens NOT DETECTED NOT DETECTED Final   Haemophilus influenzae NOT DETECTED NOT DETECTED Final   Neisseria meningitidis NOT DETECTED NOT DETECTED Final   Pseudomonas aeruginosa NOT DETECTED NOT DETECTED Final   Stenotrophomonas maltophilia NOT DETECTED NOT DETECTED Final   Candida albicans NOT DETECTED NOT DETECTED Final   Candida auris NOT DETECTED NOT DETECTED Final   Candida glabrata NOT DETECTED NOT DETECTED Final   Candida krusei NOT DETECTED NOT DETECTED Final   Candida parapsilosis NOT DETECTED NOT DETECTED Final   Candida tropicalis NOT DETECTED NOT DETECTED Final   Cryptococcus neoformans/gattii NOT DETECTED NOT DETECTED Final   Meth resistant mecA/C and MREJ NOT DETECTED NOT DETECTED Final    Comment: Performed at [facility], [address], [place], [place] [address]  Blood culture (routine x 2)     Status: None (Preliminary result)   Collection Time: 02/24/21 10:32 AM   Specimen: BLOOD RIGHT HAND  Result Value Ref Range Status   Specimen Description BLOOD RIGHT HAND  Final   Special Requests   Final    BOTTLES DRAWN AEROBIC AND ANAEROBIC Blood Culture results may not be optimal due to an inadequate volume of blood received in culture bottles   Culture   Final    NO GROWTH 4 DAYS Performed at [facility], [place] [address], [place],  [address]    Report Status PENDING  Incomplete  Expectorated Sputum Assessment w Gram Stain, Rflx to Resp Cult     Status: None   Collection Time: 02/25/21  6:34 PM   Specimen: Expectorated Sputum  Result Value Ref Range Status   Specimen Description EXPECTORATED SPUTUM  Final   Special Requests NONE  Final   Sputum evaluation   Final    Sputum specimen not acceptable for testing.  Please recollect.   RESULT CALLED TO, READ BACK BY AND VERIFIED WITHValeria Batman RN [id number] 02/26/21 A BROWNING Performed at [facility],  [address] [place], [place]  [address]    Report Status 02/26/2021 FINAL  Final  Acid Fast Smear (AFB)     Status: None   Collection Time: 02/25/21  6:34 PM   Specimen: Sputum  Result Value Ref Range Status   AFB Specimen Processing Concentration  Final   Acid Fast Smear Negative  Final    Comment: (NOTE) Performed At: [facility] [place], [place] [id number] Rush Farmer MD [id number]    Source (AFB) EXPECTORATED SPUTUM  Final    Comment: Performed at [facility], [place] [address], [place], [place] [address]  Culture, blood (Routine X 2) w Reflex to ID Panel     Status: None (Preliminary result)   Collection Time: 02/26/21  8:03 AM   Specimen: BLOOD LEFT HAND  Result Value Ref Range Status   Specimen Description BLOOD LEFT HAND  Final   Special Requests   Final    BOTTLES DRAWN AEROBIC ONLY Blood Culture results may not be optimal due to an inadequate volume of blood received in culture bottles   Culture   Final    NO GROWTH 2 DAYS Performed at [facility], [place] [address] [place], [place] [address]    Report Status PENDING  Incomplete  Culture, blood (Routine X 2) w Reflex to ID Panel     Status: None (Preliminary result)   Collection Time: 02/26/21  8:09 AM   Specimen: BLOOD LEFT FOREARM  Result Value Ref Range Status   Specimen Description BLOOD LEFT FOREARM  Final   Special Requests   Final    BOTTLES DRAWN AEROBIC ONLY Blood Culture results may not be optimal due to an inadequate volume of blood received in culture bottles   Culture   Final    NO GROWTH 2 DAYS Performed at [facility], [place] [address] [place], [place] [address]    Report Status PENDING  Incomplete  Aerobic/Anaerobic Culture w Gram Stain (surgical/deep wound)     Status: None (Preliminary result)   Collection Time: 02/27/21  1:43 PM   Specimen: Abscess  Result Value Ref Range Status   Specimen Description SHOULDER  Final   Special Requests RIGHT SUPERFICIAL ABSCESS SPEC A  Final   Gram  Stain   Final    ABUNDANT WBC PRESENT,BOTH PMN AND MONONUCLEAR RARE GRAM POSITIVE COCCI    Culture   Final    CULTURE REINCUBATED FOR BETTER GROWTH Performed at [facility], [place] [address], [place], [place] [address]    Report Status PENDING  Incomplete    Studies/Results: DG CHEST PORT 1 VIEW  Result Date: 02/27/2021 CLINICAL DATA:  Dyspnea; past history of emphysema/COPD, testicular cancer, hepatitis C, smoker EXAM: PORTABLE CHEST 1 VIEW COMPARISON:  Portable exam 0915 hours compared to 02/24/2021 FINDINGS: Normal heart size, mediastinal contours, and pulmonary vascularity. Persistent opacity in the RIGHT mid lung favoring pneumonia. Minimal RIGHT basilar atelectasis. LEFT lung remains clear. No pleural effusion or pneumothorax. Bones demineralized with evidence of prior RIGHT shoulder arthroplasty and cervical spine fusion. IMPRESSION: Persistent RIGHT mid lung infiltrate consistent with pneumonia. Mild RIGHT basilar atelectasis. Electronically Signed   By: Lavonia Dana M.D.   On: 02/27/2021 11:03      Assessment/Plan:  INTERVAL HISTORY:  He refused TEE   Principal Problem:   Surgical site infection Active Problems:   Renal insufficiency   Acute exacerbation of chronic obstructive airways disease (HCC)   Hx of hepatitis C,  reportedly treated   Dyspnea   Tobacco abuse   Polysubstance abuse (HCC)   Recurrent history of leaving AMA   Atherosclerosis of aorta (HCC)   Right lung Multifocal, major fissure, pleural-based fluid areas   Cavitary lesion of right lower lung, superior segment   Ground glass opacity present on imaging of lung   COPD exacerbation (HCC)   Staphylococcus aureus bacteremia   S/P reverse total shoulder arthroplasty, right   Neck pain   Methicillin susceptible S. aureus pyemia (HCC)   Empyema of pleura (HCC)   Infection of superficial incisional surgical site after procedure   Acute exacerbation of chronic obstructive pulmonary disease (COPD)  ([place])    Ernest Reese is a 50 y.o. male with right shoulder arthroplasty in mid February admitted with SSI and MSSA bacteremia, sp   He is sp surgery on soft tissue of shoulder. He is refusing TEE> He is reportedly in remission though yesterday apparently he was quite somnolent after visit from girlfriend. He has cavitary findings in the lung c/w septic emboli.  I would not be comfortable with DC on IV  abx and he does not want to go home on them in any case  Plan will be cefazolin for now  Then Oritavancin x 1 followed by 4 weeks of zyvox and close followup in ID clinic   Ernest Reese has an appointment on 03/12/2021 at 230PM with Dr Gale Journey.  The [facility] is located in the [facility] at  [place] in [place].  [address], which is located to the left of the elevators.  Phone: [phone]  Fax: [phone]  [email or web address]  He should arrive 15 minutes prior to his appt.  Dr Linus Salmons is available for questions this weekend.    LOS: 3 days   Alcide Evener 02/28/2021, 2:40 PM

## 2021-02-28 NOTE — Interval H&P Note (Signed)
History and Physical Interval Note:  02/28/2021 7:21 AM  Ernest Reese  has presented today for surgery, with the diagnosis of BACTEREMIA.  The various methods of treatment have been discussed with the patient and family. After consideration of risks, benefits and other options for treatment, the patient has consented to  Procedure(s): TRANSESOPHAGEAL ECHOCARDIOGRAM (TEE) (N/A) as a surgical intervention.  The patient's history has been reviewed, patient examined, no change in status, stable for surgery.  I have reviewed the patient's chart and labs.  Questions were answered to the patient's satisfaction.     Briant Angelillo

## 2021-02-28 NOTE — Progress Notes (Signed)
Family Medicine Teaching Service Daily Progress Note Intern Pager: [phone]  Patient name: Ernest Reese Medical record number: [id number] Date of birth: 1971-04-12 Age: 50 y.o. Gender: male  Primary Care Provider: Patient, No Pcp Per Consultants: Ortho, ID, Pulm, Cardiology Code Status: FULL  Pt Overview and Major Events to Date:  2/28: Admitted 3/3: I&D of right shoulder   Assessment and Plan: Ernest Reese a 50 y.o. male who was admitted for acute hypoxic respiratory failure 2/2 PNA and COPD exacerbation, also found to have cavitary lung lesions in setting of MSSA bacteremia and concern for endocarditis. He was also found to have right shoulder abscess and is now POD# 2 from I&D. PMH is significant forCOPD, right shoulder arthroplasty, testicular cancer,anemia, tobacco use, [name]  Acute hypoxic respiratory failure 2/2 PNA, COPD exacerbation  Septic emboli in setting of MSSA bacteremia: Improving  S/p Clinda (2/28), vancomycin (2/28-3/2), Zosyn (2/28-3/2). Now on Ancef (3/2-). Vitals stable, has been on room air for almost 24 hours (previously on 3L [place]) with good saturations.  -Pulmonology following, appreciate recommendations:              - recommends TEE (pt declines). 4-6 weeks abx to treat potential endocarditis. Repeat imaging after abx complete  to ensure resolution of cavitary lesions. Continue bronchodilaters. Complete total 5 days of steroids.  -ID following, appreciate recommendations:             - Continue cefazolin for now, then Oritavancin x 1 followed by 4 weeks of zyvox and close followup in ID clinic -methylprednisolone 40mg  IV (3/3-) -Respiratory therapy -Albuterol neb Q4H PRN wheezing, SOB -DuoNebs TID -Breo Ellipta 1 puff daily -Robitussin q4h PRN cough -Continue Ancef 2 g IV q8h(3/2-) -Will continue to encourage/educate on importance of TEE   Shoulder abscess s/p I&D (POD#2)  Hx right shoulder arthroplasty  I&D 3/3 by orthopedics. Final  cultures still pending, gram stain notable for GPC.  Per ortho, drain to stay in place until less than 30cc of drainage per day. RN reported about 20cc of drainage at shift change last night (1900). Patient denies pain to the area this morning. -Ortho recs: NWB to RUE in sling at all times. Okay to work on elbow, wrist and digit ROM. Orto f/u in 1-2 weeks for wound check with Dr. Stann Mainland -F/u cultures  -Continue Ancef (3/2-) -Analgesia-Tylenol 1000 mg q6h, Ibuprofen 800mg  q8h, methadone 100mg  daily   Nausea: acute, stable  Patient with nausea overnight. Received 4 mg Zofran x1 and Reglan x1 with mild improvement. He was still complaining of feeling nauseous this AM but refused Zofran. Has not vomited. Abdominal exam is not concerning for acute abdominal process as he is non-tender and non-distended. RN also noted that patient appears more "yellow" this AM. He is mildly jaundiced without scleral icterus. Will add on LFT's. Other considerations include current antibiotics, nicotine, GERD.   -Add on LFT's to BMP -CMP tomorrow AM  -Continue Zofran 4mg  q6h PRN nausea -Reglan 5-10 mg IV q8h PRN nausea if Zofran ineffective  Hypomagnesemia Mg 1.4 yesterday, still pending this AM.   Hypokalemia  K 3.3 this morning.  -Klor-con 40 mEq x1  -Monitor with BMP  Hypocalcemia 8.1 this AM. Has been low for the last several days (ranging 7.8-8.8). Suspect he may also be hypoalbuminemic though unable to correct calcium without this value. Will also need to correct Mg as appropriate (level still pending this AM). -Monitor daily; CMP in AM to assess Albumin -Correct hypomag as appropriate  AKI- resolved Baseline Cr appears to be 1.1 over the last 6 months. Creatinine 0.96 this AM, GFR >60.  -Consider d/c fluids  -Monitor with BMP  Normocytic anemia: stable AM Hgb 9, MCV 87.6. Baseline Hgb appears to be 12-14. Transfusion threshold of 8.  -Daily CBC  History of IV Heroin use  Chronic Pain   Anxiety -Methadone 100 mg daily -Tylenol 1000 mg q6h -Ibuprofen 800 q8h -Tizanidine 4mg  q6h PRN muscle spasms (has not used) -Recommend ice as well for pain and inflammation  Tobacco misuse disorder -Nicotine patch 21 mg daily -Encourage smoking cessation   Constipation  -Miralax PRN mild constipation -Colace 100 mg BID   Insomnia -Trazodone 100mg  qhs for insomnia  FEN/GI: Regular  PPx: Lovenox    Status is: Inpatient  Remains inpatient appropriate because:Ongoing active pain requiring inpatient pain management, Ongoing diagnostic testing needed not appropriate for outpatient work up and IV treatments appropriate due to intensity of illness or inability to take PO   Dispo: The patient is from: Home              Anticipated d/c is to: Home              Patient currently is not medically stable to d/c.   Difficult to place patient No   Subjective:  Ernest Reese is constantly repeating that he wants his Methadone this AM. He is not complaining of abdominal pain. He denies shoulder pain. He is still very adamant about the fact that he does not want a TEE.   Objective: Temp:  [98 F (36.7 C)-98.8 F (37.1 C)] 98.6 F (37 C) (03/04 2100) Pulse Rate:  [65-87] 65 (03/04 2100) Resp:  [16-20] 16 (03/04 2100) BP: (95-138)/(60-76) 138/72 (03/04 2100) SpO2:  [92 %-100 %] 95 % (03/04 2100) Physical Exam: General: Awake, alert, in no distress, mildly jaundiced to upper chest  Cardiovascular: RRR without murmur Respiratory: CTAB without wheezing, rhonchi, rales Abdomen: soft, non-distended, non-tender in all quadrants, no rebound or guarding  Extremities: No edema, 2+ radial and DP pulses MSK: dressing in place to right shoulder, drain in place, minimal drainage is appreciated   Laboratory: Recent Labs  Lab 02/26/21 1210 02/27/21 0754 02/28/21 0220  WBC 10.0 16.1* 12.8*  HGB 9.8* 10.9* 8.7*  HCT 31.2* 34.0* 28.2*  PLT 224 290 253   Recent Labs  Lab 02/26/21 1210  02/27/21 0754 02/28/21 0220  NA 145 141 139  K 3.4* 3.3* 3.5  CL 106 101 101  CO2 29 25 30   BUN 33* 19 17  CREATININE 1.49* 1.10 1.09  CALCIUM 8.8* 8.5* 7.8*  GLUCOSE 94 81 154*    Imaging/Diagnostic Tests: None new.   Sharion Settler, DO 02/28/2021, 9:43 PM PGY-1, [place] Intern pager: [phone], text pages welcome

## 2021-03-01 DIAGNOSIS — J869 Pyothorax without fistula: Secondary | ICD-10-CM | POA: Diagnosis not present

## 2021-03-01 DIAGNOSIS — J984 Other disorders of lung: Secondary | ICD-10-CM | POA: Diagnosis not present

## 2021-03-01 DIAGNOSIS — R918 Other nonspecific abnormal finding of lung field: Secondary | ICD-10-CM | POA: Diagnosis not present

## 2021-03-01 DIAGNOSIS — J441 Chronic obstructive pulmonary disease with (acute) exacerbation: Secondary | ICD-10-CM | POA: Diagnosis not present

## 2021-03-01 LAB — CBC
HCT: 28.2 % — ABNORMAL LOW (ref 39.0–52.0)
HCT: 29.7 % — ABNORMAL LOW (ref 39.0–52.0)
Hemoglobin: 8.7 g/dL — ABNORMAL LOW (ref 13.0–17.0)
Hemoglobin: 9 g/dL — ABNORMAL LOW (ref 13.0–17.0)
MCH: 27.2 pg (ref 26.0–34.0)
MCH: 26.5 pg (ref 26.0–34.0)
MCHC: 30.9 g/dL (ref 30.0–36.0)
MCHC: 30.3 g/dL (ref 30.0–36.0)
MCV: 88.1 fL (ref 80.0–100.0)
MCV: 87.6 fL (ref 80.0–100.0)
Platelets: 253 10*3/uL (ref 150–400)
Platelets: 295 10*3/uL (ref 150–400)
RBC: 3.2 MIL/uL — ABNORMAL LOW (ref 4.22–5.81)
RBC: 3.39 MIL/uL — ABNORMAL LOW (ref 4.22–5.81)
RDW: 17.5 % — ABNORMAL HIGH (ref 11.5–15.5)
RDW: 17.2 % — ABNORMAL HIGH (ref 11.5–15.5)
WBC: 12.8 10*3/uL — ABNORMAL HIGH (ref 4.0–10.5)
WBC: 12.6 10*3/uL — ABNORMAL HIGH (ref 4.0–10.5)
nRBC: 0 % (ref 0.0–0.2)
nRBC: 0 % (ref 0.0–0.2)

## 2021-03-01 LAB — HEPATIC FUNCTION PANEL
ALT: 14 U/L (ref 0–44)
AST: 21 U/L (ref 15–41)
Albumin: 2.1 g/dL — ABNORMAL LOW (ref 3.5–5.0)
Alkaline Phosphatase: 69 U/L (ref 38–126)
Bilirubin, Direct: <0.1 mg/dL (ref 0.0–0.2)
Total Bilirubin: 0.6 mg/dL (ref 0.3–1.2)
Total Protein: 5.6 g/dL — ABNORMAL LOW (ref 6.5–8.1)

## 2021-03-01 LAB — CULTURE, BLOOD (ROUTINE X 2): Culture: NO GROWTH

## 2021-03-01 LAB — BASIC METABOLIC PANEL
Anion gap: 10 (ref 5–15)
BUN: 10 mg/dL (ref 6–20)
CO2: 27 mmol/L (ref 22–32)
Calcium: 8.1 mg/dL — ABNORMAL LOW (ref 8.9–10.3)
Chloride: 101 mmol/L (ref 98–111)
Creatinine, Ser: 0.96 mg/dL (ref 0.61–1.24)
GFR, Estimated: >60 mL/min (ref >60)
Glucose, Bld: 107 mg/dL — ABNORMAL HIGH (ref 70–99)
Potassium: 3.3 mmol/L — ABNORMAL LOW (ref 3.5–5.1)
Sodium: 138 mmol/L (ref 135–145)

## 2021-03-01 LAB — MAGNESIUM: Magnesium: 1.7 mg/dL (ref 1.7–2.4)

## 2021-03-01 MED ORDER — POTASSIUM CHLORIDE 20 MEQ PO PACK
40.0000 meq | PACK | Freq: Once | ORAL | Status: AC
  Administered 2021-03-01: 40 meq via ORAL
  Filled 2021-03-01: qty 2

## 2021-03-01 NOTE — Progress Notes (Signed)
Pharmacy Antibiotic Note  Ernest Reese is a 50 y.o. male admitted on 02/24/2021 with bacteremia and pneumonia. Presented with complaint of worsening shoulder pain, new abscess, and increased work of breathing. Recently underwent arthroplasty of the shoulder on 2/15 and has PMH of COPD. Blood cultures positive for GPC's in clusters. BCID positive for MSSA. Pharmacy has been consulted for cefazolin dosing.  Renal function stable. Afebrile and WBCs trending down, but variable. ID team recommending TEE to evaluate patient for endocarditis, ongoing discussion. Plan would be to complete this on Monday.    Plan: Continue cefazolin IV 2 g Q8HR. Monitor renal fx and blood culture results.  Follow LOT, current plan 4-6 weeks for possible endocarditis.   Height: 6' 0.01" (182.9 cm) Weight: 87.5 kg (192 lb 14.4 oz) IBW/kg (Calculated) : 77.62  Temp (24hrs), Avg:98.6 F (37 C), Min:98.5 F (36.9 C), Max:98.8 F (37.1 C)  Recent Labs  Lab 02/25/21 0041 02/26/21 0150 02/26/21 1210 02/27/21 0754 02/28/21 0220 03/01/21 0328  WBC 11.8* 12.8* 10.0 16.1* 12.8* 12.6*  CREATININE 1.57*  --  1.49* 1.10 1.09 0.96    Estimated Creatinine Clearance: 103.3 mL/min (by C-G formula based on SCr of 0.96 mg/dL).    No Known Allergies  Antimicrobials this admission: Vancomycin 2/28 >> 3/2 Zosyn 2/28 >> 3/2  Dose adjustments this admission: N/A  Microbiology results: 2/28 BCx: gram positive cocci in clusters 2/28 BCID: Staphylococcus aureus, mecA negative 3/1 Fungal cx: sent 3/1 sputum: pending 2/15 MRSA PCR: positive 3/2 F/u blood cx : sent  Thank you for allowing pharmacy to be a part of this patient's care.  Cephus Slater, PharmD, [facility] Resident [phone] 03/01/2021 9:04 AM

## 2021-03-01 NOTE — Progress Notes (Signed)
IV team came to start IV x 2 unsuccessful d/t pt inability to be still.  This Probation officer made aware.  Pt state "No more."  Pt then proceeds to show me his arm. Since pt does not want an IV at this time pt instructed to use IS aggressively.  Temp low grade at 99.1.

## 2021-03-01 NOTE — Progress Notes (Signed)
Received call from [place], Baxter Flattery.  Patient pulled out his IV and bled all over the bed, floor and chair.  Removed IV, cleaned up patient and requested he hold pressure over the site until bleeding stops.  Put in order for IV team as pt. States he is a hard stick and a former IVDA (recent) user.

## 2021-03-01 NOTE — Progress Notes (Signed)
IV team came to assess pt current IV.  IV team came to unit and informed this writer that pt did not want another IV at this time.  Unable to give antibiotics at this time due to pt refusal.  IV team to assess after 2200 to see if pt will allow for IV restart.  IV removed by IV team nurse.

## 2021-03-01 NOTE — Plan of Care (Signed)
  Problem: Clinical Measurements: Goal: Will remain free from infection 03/01/2021 2350 by Glenna Fellows D, RN Outcome: Progressing 03/01/2021 2350 by Glenna Fellows D, RN Outcome: Progressing 03/01/2021 2348 by Glenna Fellows D, RN Outcome: Progressing   Problem: Pain Managment: Goal: General experience of comfort will improve 03/01/2021 2350 by Glenna Fellows D, RN Outcome: Progressing 03/01/2021 2350 by Glenna Fellows D, RN Outcome: Progressing 03/01/2021 2348 by Glenna Fellows D, RN Outcome: Progressing   Problem: Education: Goal: Knowledge of the prescribed therapeutic regimen will improve 03/01/2021 2350 by Glenna Fellows D, RN Outcome: Progressing 03/01/2021 2350 by Glenna Fellows D, RN Outcome: Progressing Goal: Understanding of activity limitations/precautions following surgery will improve 03/01/2021 2350 by Tula Nakayama, RN Outcome: Progressing 03/01/2021 2350 by Tula Nakayama, RN Outcome: Progressing Goal: Individualized Educational Video(s) 03/01/2021 2350 by Glenna Fellows D, RN Outcome: Progressing 03/01/2021 2350 by Tula Nakayama, RN Outcome: Progressing   Problem: Activity: Goal: Ability to tolerate increased activity will improve 03/01/2021 2350 by Glenna Fellows D, RN Outcome: Progressing 03/01/2021 2350 by Tula Nakayama, RN Outcome: Progressing

## 2021-03-02 ENCOUNTER — Encounter (HOSPITAL_COMMUNITY): Payer: Self-pay | Admitting: Anesthesiology

## 2021-03-02 DIAGNOSIS — J441 Chronic obstructive pulmonary disease with (acute) exacerbation: Secondary | ICD-10-CM | POA: Diagnosis not present

## 2021-03-02 DIAGNOSIS — T8141XD Infection following a procedure, superficial incisional surgical site, subsequent encounter: Secondary | ICD-10-CM | POA: Diagnosis not present

## 2021-03-02 DIAGNOSIS — J984 Other disorders of lung: Secondary | ICD-10-CM | POA: Diagnosis not present

## 2021-03-02 DIAGNOSIS — J869 Pyothorax without fistula: Secondary | ICD-10-CM | POA: Diagnosis not present

## 2021-03-02 LAB — CULTURE, BLOOD (ROUTINE X 2)

## 2021-03-02 LAB — ACID FAST SMEAR (AFB, MYCOBACTERIA): Acid Fast Smear: NEGATIVE

## 2021-03-02 MED ORDER — CEFTRIAXONE SODIUM 1 G IJ SOLR
2.0000 g | Freq: Once | INTRAMUSCULAR | Status: AC
  Administered 2021-03-02: 2 g via INTRAMUSCULAR
  Filled 2021-03-02: qty 20

## 2021-03-02 MED ORDER — STERILE WATER FOR INJECTION IJ SOLN
INTRAMUSCULAR | Status: AC
  Filled 2021-03-02: qty 10

## 2021-03-02 NOTE — Progress Notes (Signed)
Noted that patient lost IV access and was unable to get new access despite numerous attempts. Will give IM Ceftriaxone after discussion with attending, until we can resume IV access. Order placed.

## 2021-03-02 NOTE — Anesthesia Preprocedure Evaluation (Signed)
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  Reproductive/Obstetrics negative OB ROS                             Anesthesia Physical Anesthesia Plan  ASA: III  Anesthesia Plan: MAC   Post-op Pain Management:    Induction:   PONV Risk Score and Plan: 0 and Treatment may vary due to age or medical condition and Propofol infusion  Airway Management Planned: Natural Airway and Nasal Cannula  Additional Equipment: None  Intra-op Plan:   Post-operative Plan:   Informed Consent:   Plan Discussed with:   Anesthesia Plan Comments:         Anesthesia Quick Evaluation

## 2021-03-02 NOTE — Progress Notes (Signed)
Family Medicine Teaching Service Daily Progress Note Intern Pager: [phone]  Patient name: Ernest Reese Medical record number: [id number] Date of birth: 06-14-1971 Age: 50 y.o. Gender: male  Primary Care Provider: Patient, No Pcp Per Consultants: Ortho, ID, Pulm, Cardiology Code Status: Full  Pt Overview and Major Events to Date:  2/28-Admitted  3/3-I&D left shoulder  Assessment and Plan: Ernest Reese a 50 y.o.malepresenting with acute hypoxic respiratory failure, [name], [name], testicular cancer,anemia, tobacco use, [name]  Acute hypoxic respiratory failure Cavitary lung lesions in setting of MRSA bacteremia pneumonia COPD exacerbation S/p vancomycin (2/28-3/2), Zosyn (2/28-3/2), Clinda (2/28). MRI c spine on 3/2 no evidence of discitis or osteomyelitis -Pulmonology following, appreciate recommendations:              - recommends TEE (pt declines). 4-6 weeks abx to treat potential endocarditis. Repeat imaging after abx complete to ensure resolution of cavitary  lesions. Continue bronchodilaters. Complete total 5 days of steroids.  -ID following, appreciate recommendations:             - Continue cefazolin for now, then Oritavancin x 1 followed by 4 weeks of zyvox and close followup in ID clinic -Respiratory therapy -Albuterol neb Q4H PRN -Breo Ellipta 1 puff daily -Continue Ancef 2 g IV q8h(3/2-) when able to get IV replaced. IM CTX for now   Shoulder abscess versus hematoma  s/p right shoulder arthroplasty  R shoulder wound healing well with wound dressing in place, no bleeding or discharge. Drain in place with minimal drainage noted this morning. Per ortho, sutured in drain will remain in until less than 30cc of drainage per day, and to let them know when less than 30 cc/d. 30 CC drained during the day yesterday.  S/p arthroplasty 2 weeks ago.   CTPA on 2/28- concern for abscess versus hematoma of right shoulder. s/p I&D 3/3 per orthopedics -Ortho following, appreciate continued care and recommendations: -Continue Ancef (3/2-) -Analgesia-Tylenol 1000 mg every 6 hours, ibuprofine 800mg  q 8 hours, methadone 100mg  daily   Hypokalemia  K 3.3 yesterday morning with 52meq repleted.  -Replete as necessary -Monitor with BMP  Normocytic anemia Hb 9.0 yesterday, baseline appears to be 12-14. Likely due to chronic disease - AM CBC -Continue to monitor   History of heroin use chronic pain  Anxiety -Methadone 100 mg daily - tylenol 1000 mg q6h - ibuprofen 800 q8h - tizanidine for muscle spasms - trazodone 100mg  qhs for insomnia  Tobacco misuse disorder -Nicotine patch 21 mg daily -Encourage smoking cessation   Constipation  -Miralax PRN   FEN/GI: NPO  PPx: Lovenox   Remains inpatient appropriate because:Persistent severe electrolyte disturbances, Ongoing active pain requiring inpatient pain management and Ongoing diagnostic testing needed not appropriate for outpatient work up   Dispo: The patient is from: Home  Anticipated d/c is to: Home  Patient currently is not medically stable to d/c.              Difficult to place patient No  Subjective:  No acute events overnight. Patient sleeping comfortably when I examined him. Denied any concerns or complaints.   Objective: Temp:  [99 F (37.2 C)] 99 F (37.2 C) (03/05 2210) Pulse Rate:  [65] 65 (03/05 2210) Resp:  [18] 18 (03/05 2210) BP: (133)/(79) 133/79 (03/05 2210) SpO2:  [91 %] 91 % (03/05 2210) Physical Exam: General: NAD Cardiovascular: RRR no murmurs Respiratory: CTAB.  Normal WOB on RA  Abdomen: soft, non distended  Extremities: warm, dry. No edema.  MSK: R shoulder wound dressing in place with drain in place and minimal drainage appreciated  Laboratory: Recent Labs  Lab 02/27/21 0754 02/28/21 0220 03/01/21 0328  WBC  16.1* 12.8* 12.6*  HGB 10.9* 8.7* 9.0*  HCT 34.0* 28.2* 29.7*  PLT 290 253 295   Recent Labs  Lab 02/27/21 0754 02/28/21 0220 03/01/21 0328  NA 141 139 138  K 3.3* 3.5 3.3*  CL 101 101 101  CO2 25 30 27   BUN 19 17 10   CREATININE 1.10 1.09 0.96  CALCIUM 8.5* 7.8* 8.1*  PROT  --   --  5.6*  BILITOT  --   --  0.6  ALKPHOS  --   --  69  ALT  --   --  14  AST  --   --  21  GLUCOSE 81 154* 107*    Imaging/Diagnostic Tests: None new  Shary Key, DO 03/02/2021, 8:43 AM PGY-1, [place] Intern pager: [phone], text pages welcome

## 2021-03-02 NOTE — Progress Notes (Signed)
Dr Vanessa [place] aware and noted that pt was not able to tolerate IV placement therefore pt did not receive antibiotics as scheduled.  Md returned call to make primary nurse aware of new orders to be received.

## 2021-03-02 NOTE — Plan of Care (Signed)
  Problem: Clinical Measurements: Goal: Will remain free from infection Outcome: Progressing   Problem: Pain Managment: Goal: General experience of comfort will improve Outcome: Progressing   Problem: Education: Goal: Knowledge of the prescribed therapeutic regimen will improve Outcome: Progressing Goal: Understanding of activity limitations/precautions following surgery will improve Outcome: Progressing Goal: Individualized Educational Video(s) Outcome: Progressing   Problem: Activity: Goal: Ability to tolerate increased activity will improve Outcome: Progressing

## 2021-03-03 ENCOUNTER — Encounter (HOSPITAL_COMMUNITY)
Admission: EM | Disposition: A | Discharge status: Home / Self Care | Payer: Self-pay | Source: Home / Self Care | Attending: Family Medicine

## 2021-03-03 ENCOUNTER — Other Ambulatory Visit: Payer: Self-pay | Admitting: Family Medicine

## 2021-03-03 ENCOUNTER — Encounter (HOSPITAL_COMMUNITY): Payer: Self-pay | Admitting: Family Medicine

## 2021-03-03 DIAGNOSIS — J869 Pyothorax without fistula: Secondary | ICD-10-CM | POA: Diagnosis not present

## 2021-03-03 DIAGNOSIS — R918 Other nonspecific abnormal finding of lung field: Secondary | ICD-10-CM | POA: Diagnosis not present

## 2021-03-03 DIAGNOSIS — J441 Chronic obstructive pulmonary disease with (acute) exacerbation: Secondary | ICD-10-CM | POA: Diagnosis not present

## 2021-03-03 DIAGNOSIS — J984 Other disorders of lung: Secondary | ICD-10-CM | POA: Diagnosis not present

## 2021-03-03 DIAGNOSIS — T8149XA Infection following a procedure, other surgical site, initial encounter: Secondary | ICD-10-CM | POA: Diagnosis not present

## 2021-03-03 DIAGNOSIS — F1910 Other psychoactive substance abuse, uncomplicated: Secondary | ICD-10-CM

## 2021-03-03 LAB — CULTURE, BLOOD (ROUTINE X 2)
Culture: NO GROWTH
Culture: NO GROWTH

## 2021-03-03 LAB — CBC
HCT: 30.7 % — ABNORMAL LOW (ref 39.0–52.0)
Hemoglobin: 9.8 g/dL — ABNORMAL LOW (ref 13.0–17.0)
MCH: 27.6 pg (ref 26.0–34.0)
MCHC: 31.9 g/dL (ref 30.0–36.0)
MCV: 86.5 fL (ref 80.0–100.0)
Platelets: 350 10*3/uL (ref 150–400)
RBC: 3.55 MIL/uL — ABNORMAL LOW (ref 4.22–5.81)
RDW: 17 % — ABNORMAL HIGH (ref 11.5–15.5)
WBC: 12.4 10*3/uL — ABNORMAL HIGH (ref 4.0–10.5)
nRBC: 0 % (ref 0.0–0.2)

## 2021-03-03 LAB — BASIC METABOLIC PANEL
Anion gap: 11 (ref 5–15)
BUN: 12 mg/dL (ref 6–20)
CO2: 26 mmol/L (ref 22–32)
Calcium: 8.1 mg/dL — ABNORMAL LOW (ref 8.9–10.3)
Chloride: 101 mmol/L (ref 98–111)
Creatinine, Ser: 1.05 mg/dL (ref 0.61–1.24)
GFR, Estimated: >60 mL/min (ref >60)
Glucose, Bld: 71 mg/dL (ref 70–99)
Potassium: 3.3 mmol/L — ABNORMAL LOW (ref 3.5–5.1)
Sodium: 138 mmol/L (ref 135–145)

## 2021-03-03 SURGERY — CANCELLED PROCEDURE
Anesthesia: Monitor Anesthesia Care

## 2021-03-03 MED ORDER — LINEZOLID 600 MG PO TABS: 600.0000 mg | ORAL_TABLET | Freq: Two times a day (BID) | ORAL | 0 refills | Status: AC

## 2021-03-03 MED ORDER — LINEZOLID 600 MG PO TABS: 600.0000 mg | ORAL_TABLET | Freq: Two times a day (BID) | ORAL | 0 refills | Status: DC

## 2021-03-03 MED ORDER — POTASSIUM CHLORIDE 20 MEQ PO PACK
40.0000 meq | PACK | Freq: Once | ORAL | Status: AC
  Administered 2021-03-03: 40 meq via ORAL
  Filled 2021-03-03: qty 2

## 2021-03-03 MED ORDER — UMECLIDINIUM-VILANTEROL 62.5-25 MCG/INH IN AEPB
1.0000 | INHALATION_SPRAY | Freq: Every day | RESPIRATORY_TRACT | Status: DC
  Administered 2021-03-03: 1 via RESPIRATORY_TRACT
  Filled 2021-03-03: qty 14

## 2021-03-03 MED ORDER — ORITAVANCIN DIPHOSPHATE 400 MG IV SOLR
1200.0000 mg | Freq: Once | INTRAVENOUS | Status: AC
  Administered 2021-03-03: 1200 mg via INTRAVENOUS
  Filled 2021-03-03: qty 120

## 2021-03-03 NOTE — Discharge Summary (Signed)
[facility] Discharge Summary  Patient name: Ernest Reese Medical record number: [id number] Date of birth: 1971/02/07 Age: 50 y.o. Gender: male Date of Admission: 02/24/2021  Date of Discharge: 03/03/21 Admitting Physician: Blane Ohara McDiarmid, MD  Primary Care Provider: Patient, No Pcp Per Consultants: None  Indication for Hospitalization: multifocal pneumonia, dyspnea, surgical site infection   Discharge Diagnoses/Problem List:   Dyspnea COPD exacerbation  Surgical site infection  R arm cellulitis   Disposition: Stable  Discharge Condition: Improved   Discharge Exam:   Temp:  [98.6 F (37 C)-99.7 F (37.6 C)] 99.7 F (37.6 C) (03/07 0526) Pulse Rate:  [64-97] 97 (03/07 0526) Resp:  [15-16] 16 (03/07 0526) BP: (137-139)/(70-83) 139/70 (03/07 0526) SpO2:  [87 %-91 %] 91 % (03/07 0526) Physical Exam: General: alert, NAD Cardiovascular: RRR no murmurs Respiratory: CTAB normal WOB Abdomen: soft, non distended, non tender Extremities: warm, dry. No LE edema  MSK: R shoulder wound dressing in place with drain in place and minimal drainage   Brief Hospital Course:   Ernest Reese is a 50 y.o. male presenting with SOB 2/2 multifocal PNA and shoulder pain, s/p arthroplasty 2 weeks ago. PMH is significant for COPD, testicular cancer, anemia, tobacco use, [name]   Multifocal pneumonia  COPD exacerbation  Pulmonary Septic Emboli In ED, hypoxic to 88%, placed on 2L [place]. Received continuous nebulizer, Clinda 600 mg, methylprednisolone and 1L NS bolus. Labs significant for WBC 18.8 with left shift, BNP elevated at 305.6, BUN 30, Cr 1.52 and Hgb 11.3. CXR with increased interstitial markings and ovoid opacity potentially representing pleural fluid within the minor fissure. CTA negative for PE but showed multifocal areas of fluid within right pleural space and fissures with associated areas of cavitation. Recent MRSA swab + on 2/11. Exam notable for  extensive wheezing, tachycardia and hyopxemia. Patient was started on Duonebs q4h, continued on steroids, abx switched to vanc and zosyn until cultures returned as MSSA and patient de-escalated to Ancef. Repeat blood cultures showed no growth in 5 days. Sputum with yeast observed. Echo with LVEF 60-65%. Pulmonology and ID consulted and recommended repeat CT after 4-6 weeks of abx. Patient was recommended to undergo TEE given his septic emboli but he refused. He was ultimately treated as endocarditis with 6 weeks of abx. He received 1200mg   Ernest Reese on day of discharge (3/7) and was instructed to take Linezolid starting on 3/14 for 30 days.   Surgical site infection s/p left shoulder Arthroplasty Ortho consulted, recommended Clindamycin 600 mg TID. Given concern for MRSA PNA, patient was instead started on Vanc and Zosyn. Antibiotics switched once again to Ancef once culture showed MSSA. Irrigation and debridement performed on 3/3 without complication and Hemovac drain was kept in place until discharge on 3/7 . Pain control post I&D with scheduled Acetaminophen, Ibuprofen and Tizanidine PRN (though did not require).   Given history IVDU and neck tenderness as well, an MRI of c-spine was performed and showed no acute findings or evidence of discitis or osteomyelitis.   Previous Heroin Use Patient was continued on 100 mg of Methadone qd. He was instructed to return to MAT clinic following discharge for further medication.   Issues for Follow Up: 1. Follow up with [facility] 1-2  weeks s/p I&D (week of March 14th or 21st) for wound check and re-evaluation with Dr. Stann Mainland.  2.  Patient to start zyvox pill on 3/14 for 4 weeks with repeat imaging after abx are complete to ensure resolution  of cavitary lesions.   Significant Procedures: Irrigation and debridement of R shoulder on 3/3/7  Significant Labs and Imaging:  Recent Labs  Lab 02/28/21 0220 03/01/21 0328 03/03/21 0142  WBC 12.8* 12.6* 12.4*   HGB 8.7* 9.0* 9.8*  HCT 28.2* 29.7* 30.7*  PLT 253 295 350   Recent Labs  Lab 02/26/21 1210 02/27/21 0754 02/28/21 0220 03/01/21 0328 03/03/21 0142  NA 145 141 139 138 138  K 3.4* 3.3* 3.5 3.3* 3.3*  CL 106 101 101 101 101  CO2 29 25 30 27 26   GLUCOSE 94 81 154* 107* 71  BUN 33* 19 17 10 12   CREATININE 1.49* 1.10 1.09 0.96 1.05  CALCIUM 8.8* 8.5* 7.8* 8.1* 8.1*  MG  --  1.4*  --  1.7  --   ALKPHOS  --   --   --  69  --   AST  --   --   --  21  --   ALT  --   --   --  14  --   ALBUMIN  --   --   --  2.1*  --     Results/Tests Pending at Time of Discharge: none  Discharge Medications:  Allergies as of 03/03/2021   No Known Allergies     Medication List    TAKE these medications   albuterol 108 (90 Base) MCG/ACT inhaler Commonly known as: VENTOLIN HFA Inhale into the lungs every 6 (six) hours as needed for wheezing or shortness of breath.   albuterol (2.5 MG/3ML) 0.083% nebulizer solution Commonly known as: PROVENTIL Take 2.5 mg by nebulization every 6 (six) hours as needed for shortness of breath.   gabapentin 600 MG tablet Commonly known as: NEURONTIN Take 600 mg by mouth 3 (three) times daily.   ibuprofen 200 MG tablet Commonly known as: ADVIL Take 800 mg by mouth every 6 (six) hours as needed for moderate pain.   linezolid 600 MG tablet Commonly known as: ZYVOX Take 1 tablet (600 mg total) by mouth 2 (two) times daily. Start taking on 3/21 for a full 30 days Start taking on: March 17, 2021   methadone 10 MG/5ML solution Commonly known as: DOLOPHINE Take 100 mg by mouth daily.       Discharge Instructions: Please refer to Patient Instructions section of EMR for full details.  Patient was counseled important signs and symptoms that should prompt return to medical care, changes in medications, dietary instructions, activity restrictions, and follow up appointments.   Follow-Up Appointments:  Follow-up Information    Nicholes Stairs, MD In 2  weeks.   Specialty: Orthopedic Surgery Why: For suture removal, For wound re-check Contact information: [address] [place] [address] [phone]        Jabier Mutton, MD. Go on 03/12/2021.   Specialty: Infectious Diseases Why: Please arrive at 2:15PM for an appointment at 2:30 PM Contact information: [address] [place] [address] [phone]        [facility] Follow up.   Why: You should report tomorrow 03/04/2021 at your normal dispensing times (6am-10:15am) to recieve your methadone dose. Copies of your discharge paper work have been faxed per request of the treatment center.   Contact information: [place], [place], DO 03/03/2021, 4:44 PM PGY-1, [facility]

## 2021-03-03 NOTE — Progress Notes (Signed)
Interim Progress Note  Two sutures cut and drain removed from R shoulder without complication. Bandages applied over shoulder. Plan for d/c.

## 2021-03-03 NOTE — Progress Notes (Signed)
Pt given discharge instructions, prescriptions, and care notes. Pt verbalized understanding AEB no further questions or concerns at this time. IV was discontinued, no redness, pain, or swelling noted at this time. Pt left the floor via wheelchair with staff in stable condition. 

## 2021-03-03 NOTE — TOC Progression Note (Signed)
Transition of Care [facility]) - Progression Note    Patient Details  Name: Ernest Reese MRN: [id number] Date of Birth: 03-14-1971  Transition of Care [facility]) CM/SW [place], RN Phone Number: [phone]  03/03/2021, 2:36 PM  Clinical Narrative:    Discharge paperwork has been faxed to [facility].         Expected Discharge Plan and Services           Expected Discharge Date: 03/03/21                                     Social Determinants of Health (SDOH) Interventions    Readmission Risk Interventions No flowsheet data found.

## 2021-03-03 NOTE — Care Management Important Message (Signed)
Important Message  Patient Details  Name: Ernest Reese MRN: [id number] Date of Birth: [birth year]   Medicare Important Message Given:  Yes - Important Message mailed due to current National Emergency   Verbal consent obtained due to current National Emergency  Relationship to patient: Self Contact Name: Elizeo Rodriques Call Date: 03/03/21  Time: 1435 Phone: [phone] Outcome: No Answer/Busy Important Message mailed to: Patient address on file    Delorse Lek 03/03/2021, 2:35 PM

## 2021-03-03 NOTE — Discharge Instructions (Signed)
You were treated for an abscess in your right shoulder as well as an infection in your blood and lungs.  - Please start taking zyvox 1 pill daily starting March 14th for 4 weeks - Please follow up with Dr. Stann [facility] at [facility] in [facility] - Please follow up with Dr. Gale Journey at [facility]   If you become short of breath, have a fever (temperature greater than 100.4*F), notice pustulant drainage or worsening/spreading redness in your shoulder, please seek urgent medical attention or return to the emergency department.
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
## 2021-03-03 NOTE — Progress Notes (Signed)
Occupational Therapy Treatment Patient Details Name: Ernest Reese MRN: [id number] DOB: February 14, 1971 Today's Date: 03/03/2021    History of present illness Pt is a 50 y.o. male admitted 02/24/21 with SOB and shoulder pain; pt s/p R shoulder hemi-arthroplasty (02/11/21 with Dr. Stann Mainland), now with surgical site cellulitis. R shoulder I&D 3/3. Pt declining recommendation for TEE during admission. PMH includes COPD, anemia, testicular CA, tobacco use.   OT comments  Pt expecting to DC home today. Session focused on education regarding compensatory strategies for UB bathing/dressing and allowable ROM per protocol during ADL session. Pt verbalizes understanding however does not necessarily follow.   Follow Up Recommendations  Follow surgeon's recommendation for DC plan and follow-up therapies    Equipment Recommendations  None recommended by OT    Recommendations for Other Services      Precautions / Restrictions Precautions Precautions: Fall;Other (comment);Shoulder Type of Shoulder Precautions: R hemiarthroplasty Shoulder Interventions: Shoulder sling/immobilizer;At all times;Off for dressing/bathing/exercises Precaution Booklet Issued: Yes (comment) Precaution Comments: monitor O2, no R external rotation beyond 0* Required Braces or Orthoses: Sling       Mobility Bed Mobility                    Transfers Overall transfer level: Independent                    Balance                                           ADL either performed or assessed with clinical judgement   ADL Overall ADL's : Needs assistance/impaired     Grooming: Minimal assistance Grooming Details (indicate cue type and reason): A to put deoderant under L arm Upper Body Bathing: Minimal assistance;Sitting       Upper Body Dressing : Minimal assistance;Sitting   Lower Body Dressing: Minimal assistance Lower Body Dressing Details (indicate cue type and reason): A to button  jeans; recommended use of elastic waist pants rather than jeans - pt verbalized understanding             Functional mobility during ADLs: Modified independent General ADL Comments: Educated pt on compensatory strategies for UB B/D. PT verbalized understanding however requires cues to adhere to restricted ROM and WB limitations; educated on overhead technique to [employer] sling     Vision       Perception     Praxis      Cognition Arousal/Alertness: Awake/alert Behavior During Therapy: WFL for tasks assessed/performed;Impulsive Overall Cognitive Status: No family/caregiver present to determine baseline cognitive functioning                                 General Comments: poor safety/implementation of shoulder precautions. though able to verbally recall them, does not always implement them during tasks        Exercises Shoulder Exercises Elbow Flexion: AROM;Right;10 reps;Seated Elbow Extension: AROM;Right;Seated;10 reps Wrist Flexion: AROM;Right;10 reps;Seated Wrist Extension: AROM;Right;10 reps;Seated Digit Composite Flexion: AROM;Right;10 reps;Seated Composite Extension: AROM;Right;10 reps;Seated   Shoulder Instructions       General Comments Pt able to verbalize allowable ROM;    Pertinent Vitals/ Pain       Pain Assessment: 0-10 Pain Score: 5  Pain Location: R shoulder Pain Descriptors / Indicators: Grimacing;Guarding Pain Intervention(s): Limited activity within patient's tolerance  Home Living                                          Prior Functioning/Environment              Frequency           Progress Toward Goals  OT Goals(current goals can now be found in the care plan section)  Progress towards OT goals: Progressing toward goals  Acute Rehab OT Goals Patient Stated Goal: go home OT Goal Formulation: With patient Time For Goal Achievement: 03/11/21 Potential to Achieve Goals: Good ADL Goals Pt  Will Perform Upper Body Bathing: with modified independence;sitting Pt Will Perform Upper Body Dressing: with modified independence;sitting Pt/caregiver will Perform Home Exercise Program: Right Upper extremity;With written HEP provided;With Supervision  Plan Discharge plan remains appropriate    Co-evaluation                 AM-PAC OT "6 Clicks" Daily Activity     Outcome Measure   Help from another person eating meals?: None Help from another person taking care of personal grooming?: A Little Help from another person toileting, which includes using toliet, bedpan, or urinal?: A Little Help from another person bathing (including washing, rinsing, drying)?: A Little Help from another person to put on and taking off regular upper body clothing?: A Little Help from another person to put on and taking off regular lower body clothing?: A Little 6 Click Score: 19    End of Session    OT Visit Diagnosis: Other abnormalities of gait and mobility (R26.89);Muscle weakness (generalized) (M62.81);Pain Pain - Right/Left: Right Pain - part of body: Shoulder   Activity Tolerance Patient tolerated treatment well   Patient Left in bed;with call bell/phone within reach   Nurse Communication Other (comment) (DC needs)        Time: 1638-4536 OT Time Calculation (min): 23 min  Charges: OT General Charges $OT Visit: 1 Visit OT Treatments $Self Care/Home Management : 8-22 mins $Therapeutic Exercise: 8-22 mins  Maurie Boettcher, OT/L   Acute OT Clinical Specialist Acute Rehabilitation Services Pager [phone] Office [phone]    [facility] 03/03/2021, 9:12 AM

## 2021-03-04 LAB — AEROBIC/ANAEROBIC CULTURE W GRAM STAIN (SURGICAL/DEEP WOUND)

## 2021-03-12 ENCOUNTER — Ambulatory Visit: Payer: Medicare HMO | Admitting: Internal Medicine

## 2021-03-13 ENCOUNTER — Telehealth: Payer: Self-pay

## 2021-03-13 NOTE — Progress Notes (Incomplete)
Addendum to discharge summary  For patient's primary problem of shortness of breath due to shoulder surgical site infection he did meet sepsis criteria with his leukocytosis, tachycardic and known source of infection including  He additionally did have MRSA bacteremia

## 2021-03-13 NOTE — Telephone Encounter (Signed)
LMTCB  Will route message to triage to follow up/hold.

## 2021-03-13 NOTE — Progress Notes (Signed)
Please call patient and let him know his sputum culture was positive for yeast.  Please call in Diflucan, 200 mg first day, then 100 mg daily x 13 days. He has been treated for a pneumonia with a broad spectrum antibiotic recently, this will treat the yeast. He will need a follow up appointment in 4 weeks to make sure he is doing better. Thanks so much

## 2021-03-13 NOTE — Telephone Encounter (Signed)
-----   Message from Magdalen Spatz, NP sent at 03/13/2021 11:58 AM EDT ----- Please call patient and let him know his sputum culture was positive for yeast.  Please call in Diflucan, 200 mg first day, then 100 mg daily x 13 days. He has been treated for a pneumonia with a broad spectrum antibiotic recently, this will treat the yeast. He will need a follow up appointment in 4 weeks to make sure he is doing better. Thanks so much

## 2021-03-13 NOTE — Telephone Encounter (Signed)
LMTCB

## 2021-03-13 NOTE — Progress Notes (Addendum)
Addendum to discharge summary  For patient's primary problem of shortness of breath due to shoulder surgical site infection he did meet sepsis criteria from admission with his leukocytosis, tachycardic, and with his multifocal infection/pneuonia

## 2021-03-14 MED ORDER — FLUCONAZOLE 100 MG PO TABS: ORAL_TABLET | ORAL | 0 refills | Status: DC

## 2021-03-14 NOTE — Telephone Encounter (Signed)
I have called the pt and spoke with him about his results.  He is aware that his medications have been sent to the pharmacy.

## 2021-03-26 LAB — FUNGUS CULTURE WITH STAIN

## 2021-03-26 LAB — FUNGAL ORGANISM REFLEX

## 2021-03-26 LAB — FUNGUS CULTURE RESULT

## 2021-03-28 LAB — FUNGAL ORGANISM REFLEX

## 2021-03-28 LAB — FUNGUS CULTURE WITH STAIN

## 2021-04-11 LAB — ACID FAST CULTURE WITH REFLEXED SENSITIVITIES (MYCOBACTERIA): Acid Fast Culture: NEGATIVE

## 2021-04-14 LAB — ACID FAST CULTURE WITH REFLEXED SENSITIVITIES (MYCOBACTERIA): Acid Fast Culture: NEGATIVE

## 2021-04-16 ENCOUNTER — Other Ambulatory Visit (HOSPITAL_COMMUNITY)
Admission: RE | Admit: 2021-04-16 | Discharge: 2021-04-16 | Disposition: A | Discharge status: Home / Self Care | Payer: Medicare HMO | Source: Ambulatory Visit | Attending: Orthopedic Surgery | Admitting: Orthopedic Surgery

## 2021-04-16 DIAGNOSIS — Z01812 Encounter for preprocedural laboratory examination: Secondary | ICD-10-CM | POA: Diagnosis present

## 2021-04-16 DIAGNOSIS — Z20822 Contact with and (suspected) exposure to covid-19: Secondary | ICD-10-CM | POA: Insufficient documentation

## 2021-04-16 LAB — SARS CORONAVIRUS 2 (TAT 6-24 HRS): SARS Coronavirus 2: NEGATIVE

## 2021-04-17 ENCOUNTER — Encounter (HOSPITAL_COMMUNITY): Payer: Self-pay | Admitting: Orthopedic Surgery

## 2021-04-17 NOTE — Anesthesia Preprocedure Evaluation (Addendum)
Anesthesia Evaluation  Patient identified by MRN, date of birth, ID band Patient awake    Reviewed: Allergy & Precautions, H&P , NPO status , Patient's Chart, lab work & pertinent test results  Airway Mallampati: I  TM Distance: >3 FB Neck ROM: Full    Dental no notable dental hx. (+) Edentulous Upper, Partial Lower, Poor Dentition, Dental Advisory Given   Pulmonary shortness of breath, COPD,  COPD inhaler, Current SmokerPatient did not abstain from smoking.,    Pulmonary exam normal breath sounds clear to auscultation       Cardiovascular negative cardio ROS   Rhythm:Regular Rate:Normal  Echo 02/25/21: IMPRESSIONS  1. Left ventricular ejection fraction, by estimation, is 60 to 65%. The  left ventricle has normal function. The left ventricle has no regional  wall motion abnormalities. Left ventricular diastolic parameters were  normal.  2. Right ventricular systolic function is normal. The right ventricular  size is normal.  3. The mitral valve is normal in structure. No evidence of mitral valve  regurgitation. No evidence of mitral stenosis.  4. The aortic valve is normal in structure. There is mild calcification  of the aortic valve. Aortic valve regurgitation is mild. Mild aortic valve  sclerosis is present, with no evidence of aortic valve stenosis.  5. Aortic dilatation noted. There is moderate dilatation of the aortic  root, measuring 41 mm.  6. The inferior vena cava is normal in size with greater than 50%  respiratory variability, suggesting right atrial pressure of 3 mmHg.  FINDINGS  Left Ventricle: Left ventricular ejection fraction, by estimation, is 60  to 65%. The left ventricle has normal function. The left ventricle has no  regional wall motion abnormalities. The left ventricular internal cavity  size was normal in size. There is  no left ventricular hypertrophy. Left ventricular diastolic parameters   were normal.     Neuro/Psych Anxiety Bipolar Disorder Schizophrenia negative neurological ROS     GI/Hepatic negative GI ROS, (+) Hepatitis -, C  Endo/Other  negative endocrine ROS  Renal/GU negative Renal ROS  negative genitourinary   Musculoskeletal  (+) Arthritis , Osteoarthritis,    Abdominal   Peds  Hematology  (+) Blood dyscrasia, anemia ,   Anesthesia Other Findings   Reproductive/Obstetrics negative OB ROS                           Anesthesia Physical Anesthesia Plan  ASA: III  Anesthesia Plan: General   Post-op Pain Management:  Regional for Post-op pain   Induction: Intravenous  PONV Risk Score and Plan: 1 and Midazolam and Ondansetron  Airway Management Planned: Oral ETT  Additional Equipment:   Intra-op Plan:   Post-operative Plan: Extubation in OR  Informed Consent: I have reviewed the patients History and Physical, chart, labs and discussed the procedure including the risks, benefits and alternatives for the proposed anesthesia with the patient or authorized representative who has indicated his/her understanding and acceptance.     Dental advisory given  Plan Discussed with: CRNA  Anesthesia Plan Comments: (See PAT note written 04/17/2021 by Myra Gianotti, PA-C. SAME DAY WORK-UP  He is s/p right shoulder hemiarthroplasty on 02/11/21. Admission 02/24/21-03/03/21 for COPD exacerbation, multifocal pneumonia, and right arm cellulitis/surgical site infection. S/p I&D in the OR under general anesthesia 02/27/21 with ongoing deep space infection at follow-up, and he "has not really been able to execute the infectious disease recommendations for antibiotics". + Smoker, COPD, Schizophrenia, polysubstance abuse (on  methadone).He had a reactive HCV Ab test on 02/27/21.   )       Anesthesia Quick Evaluation
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
## 2021-04-17 NOTE — Progress Notes (Addendum)
Mr. Ernest Reese denies chest pain or shortness of breath. Patient was tested for Covid and has been in quarantine since that time.  Mr. Ernest Reese states that he has COPD-  with much movement.  Mr. Ernest Reese reports that he started- coughing up thick yellow-green sputum, on 04/16/21, patient started taking Mucinex- Maxium Strength 1200 mg every 1200 hours, "it is really bringing the stuff up and the color is now a light yellow. I asked if he has seen a Dr., "no it just started yesterday."  Patient denies fever.    Mr. Ernest Reese was on antibiotics for  Shoulder infection, they made him sick on his stomach- patient notified Dr. Stann Mainland, who told him to stop antibiotics. PCP is Earlie Lou, [address], [place].

## 2021-04-18 ENCOUNTER — Encounter (HOSPITAL_COMMUNITY)
Admission: RE | Disposition: A | Discharge status: Home / Self Care | Payer: Self-pay | Source: Home / Self Care | Attending: Orthopedic Surgery

## 2021-04-18 ENCOUNTER — Inpatient Hospital Stay (HOSPITAL_COMMUNITY): Payer: Medicare HMO | Admitting: Vascular Surgery

## 2021-04-18 ENCOUNTER — Other Ambulatory Visit: Payer: Self-pay

## 2021-04-18 ENCOUNTER — Encounter (HOSPITAL_COMMUNITY): Payer: Self-pay | Admitting: Orthopedic Surgery

## 2021-04-18 ENCOUNTER — Ambulatory Visit (HOSPITAL_COMMUNITY)
Admission: RE | Admit: 2021-04-18 | Discharge: 2021-04-18 | Disposition: A | Discharge status: Home / Self Care | Payer: Medicare HMO | Attending: Orthopedic Surgery | Admitting: Orthopedic Surgery

## 2021-04-18 DIAGNOSIS — Y831 Surgical operation with implant of artificial internal device as the cause of abnormal reaction of the patient, or of later complication, without mention of misadventure at the time of the procedure: Secondary | ICD-10-CM | POA: Diagnosis not present

## 2021-04-18 DIAGNOSIS — Z79899 Other long term (current) drug therapy: Secondary | ICD-10-CM | POA: Insufficient documentation

## 2021-04-18 DIAGNOSIS — F17210 Nicotine dependence, cigarettes, uncomplicated: Secondary | ICD-10-CM | POA: Diagnosis not present

## 2021-04-18 DIAGNOSIS — Z96659 Presence of unspecified artificial knee joint: Secondary | ICD-10-CM | POA: Insufficient documentation

## 2021-04-18 DIAGNOSIS — Z8616 Personal history of COVID-19: Secondary | ICD-10-CM | POA: Insufficient documentation

## 2021-04-18 DIAGNOSIS — Z8547 Personal history of malignant neoplasm of testis: Secondary | ICD-10-CM | POA: Diagnosis not present

## 2021-04-18 DIAGNOSIS — J439 Emphysema, unspecified: Secondary | ICD-10-CM | POA: Diagnosis not present

## 2021-04-18 DIAGNOSIS — Z96611 Presence of right artificial shoulder joint: Secondary | ICD-10-CM | POA: Diagnosis not present

## 2021-04-18 DIAGNOSIS — T8459XA Infection and inflammatory reaction due to other internal joint prosthesis, initial encounter: Secondary | ICD-10-CM | POA: Diagnosis present

## 2021-04-18 HISTORY — DX: Bipolar disorder, unspecified: F31.9

## 2021-04-18 HISTORY — DX: Dyspnea, unspecified: R06.00

## 2021-04-18 HISTORY — DX: Inflammatory liver disease, unspecified: K75.9

## 2021-04-18 HISTORY — DX: Unspecified osteoarthritis, unspecified site: M19.90

## 2021-04-18 HISTORY — DX: Pneumonia, unspecified organism: J18.9

## 2021-04-18 HISTORY — DX: Anxiety disorder, unspecified: F41.9

## 2021-04-18 HISTORY — PX: EXCISIONAL TOTAL SHOULDER ARTHROPLASTY WITH ANTIBIOTIC SPACER: SHX6264

## 2021-04-18 HISTORY — DX: Schizophrenia, unspecified: F20.9

## 2021-04-18 LAB — CBC
HCT: 32.2 % — ABNORMAL LOW (ref 39.0–52.0)
Hemoglobin: 9.4 g/dL — ABNORMAL LOW (ref 13.0–17.0)
MCH: 25.7 pg — ABNORMAL LOW (ref 26.0–34.0)
MCHC: 29.2 g/dL — ABNORMAL LOW (ref 30.0–36.0)
MCV: 88 fL (ref 80.0–100.0)
Platelets: 373 10*3/uL (ref 150–400)
RBC: 3.66 MIL/uL — ABNORMAL LOW (ref 4.22–5.81)
RDW: 16.3 % — ABNORMAL HIGH (ref 11.5–15.5)
WBC: 11.7 10*3/uL — ABNORMAL HIGH (ref 4.0–10.5)
nRBC: 0 % (ref 0.0–0.2)

## 2021-04-18 LAB — COMPREHENSIVE METABOLIC PANEL
ALT: 13 U/L (ref 0–44)
AST: 19 U/L (ref 15–41)
Albumin: 3 g/dL — ABNORMAL LOW (ref 3.5–5.0)
Alkaline Phosphatase: 86 U/L (ref 38–126)
Anion gap: 8 (ref 5–15)
BUN: 18 mg/dL (ref 6–20)
CO2: 30 mmol/L (ref 22–32)
Calcium: 8.6 mg/dL — ABNORMAL LOW (ref 8.9–10.3)
Chloride: 98 mmol/L (ref 98–111)
Creatinine, Ser: 1.77 mg/dL — ABNORMAL HIGH (ref 0.61–1.24)
GFR, Estimated: 47 mL/min — ABNORMAL LOW (ref >60)
Glucose, Bld: 122 mg/dL — ABNORMAL HIGH (ref 70–99)
Potassium: 3.9 mmol/L (ref 3.5–5.1)
Sodium: 136 mmol/L (ref 135–145)
Total Bilirubin: 0.7 mg/dL (ref 0.3–1.2)
Total Protein: 7.4 g/dL (ref 6.5–8.1)

## 2021-04-18 LAB — SURGICAL PCR SCREEN
MRSA, PCR: NEGATIVE
Staphylococcus aureus: NEGATIVE

## 2021-04-18 SURGERY — REMOVAL, HARDWARE, SHOULDER, WITH IRRIGATION, DEBRIDEMENT, AND INSERTION OF ANTIBIOTIC BEADS OR ANTIBIOTIC SPACER
Anesthesia: General | Site: Shoulder | Laterality: Right

## 2021-04-18 MED ORDER — MIDAZOLAM HCL 2 MG/2ML IJ SOLN: 2.0000 mg | Freq: Once | INTRAMUSCULAR | Status: AC

## 2021-04-18 MED ORDER — LIDOCAINE 2% (20 MG/ML) 5 ML SYRINGE
INTRAMUSCULAR | Status: DC | PRN
  Administered 2021-04-18: 40 mg via INTRAVENOUS

## 2021-04-18 MED ORDER — MIDAZOLAM HCL 2 MG/2ML IJ SOLN
INTRAMUSCULAR | Status: AC
  Filled 2021-04-18: qty 2

## 2021-04-18 MED ORDER — CEFAZOLIN SODIUM-DEXTROSE 2-4 GM/100ML-% IV SOLN
2.0000 g | INTRAVENOUS | Status: AC
  Administered 2021-04-18: 2 g via INTRAVENOUS
  Filled 2021-04-18: qty 100

## 2021-04-18 MED ORDER — EPHEDRINE SULFATE-NACL 50-0.9 MG/10ML-% IV SOSY
PREFILLED_SYRINGE | INTRAVENOUS | Status: DC | PRN
  Administered 2021-04-18: 5 mg via INTRAVENOUS
  Administered 2021-04-18: 10 mg via INTRAVENOUS

## 2021-04-18 MED ORDER — OXYCODONE HCL 5 MG PO TABS: 15.0000 mg | ORAL_TABLET | ORAL | 0 refills | Status: DC | PRN

## 2021-04-18 MED ORDER — ACETAMINOPHEN 10 MG/ML IV SOLN
INTRAVENOUS | Status: AC
  Filled 2021-04-18: qty 100

## 2021-04-18 MED ORDER — VANCOMYCIN HCL 1000 MG/200ML IV SOLN
1000.0000 mg | INTRAVENOUS | Status: AC
  Administered 2021-04-18: 1000 mg via INTRAVENOUS
  Filled 2021-04-18: qty 200

## 2021-04-18 MED ORDER — PHENYLEPHRINE 40 MCG/ML (10ML) SYRINGE FOR IV PUSH (FOR BLOOD PRESSURE SUPPORT)
PREFILLED_SYRINGE | INTRAVENOUS | Status: AC
  Filled 2021-04-18: qty 10

## 2021-04-18 MED ORDER — IPRATROPIUM-ALBUTEROL 0.5-2.5 (3) MG/3ML IN SOLN
3.0000 mL | Freq: Once | RESPIRATORY_TRACT | Status: AC
  Administered 2021-04-18: 3 mL via RESPIRATORY_TRACT

## 2021-04-18 MED ORDER — TRANEXAMIC ACID-NACL 1000-0.7 MG/100ML-% IV SOLN
1000.0000 mg | INTRAVENOUS | Status: AC
  Administered 2021-04-18: 1000 mg via INTRAVENOUS
  Filled 2021-04-18: qty 100

## 2021-04-18 MED ORDER — FENTANYL CITRATE (PF) 250 MCG/5ML IJ SOLN
INTRAMUSCULAR | Status: AC
  Filled 2021-04-18: qty 5

## 2021-04-18 MED ORDER — BUPIVACAINE LIPOSOME 1.3 % IJ SUSP
INTRAMUSCULAR | Status: DC | PRN
  Administered 2021-04-18: 10 mL via PERINEURAL

## 2021-04-18 MED ORDER — IPRATROPIUM-ALBUTEROL 0.5-2.5 (3) MG/3ML IN SOLN: 3.0000 mL | RESPIRATORY_TRACT | Status: DC

## 2021-04-18 MED ORDER — CHLORHEXIDINE GLUCONATE 0.12 % MT SOLN
OROMUCOSAL | Status: AC
  Administered 2021-04-18: 15 mL via OROMUCOSAL
  Filled 2021-04-18: qty 15

## 2021-04-18 MED ORDER — MIDAZOLAM HCL 2 MG/2ML IJ SOLN
INTRAMUSCULAR | Status: AC
  Administered 2021-04-18: 2 mg via INTRAVENOUS
  Filled 2021-04-18: qty 2

## 2021-04-18 MED ORDER — ALBUMIN HUMAN 5 % IV SOLN: INTRAVENOUS | Status: DC | PRN

## 2021-04-18 MED ORDER — ROCURONIUM BROMIDE 10 MG/ML (PF) SYRINGE
PREFILLED_SYRINGE | INTRAVENOUS | Status: DC | PRN
  Administered 2021-04-18: 30 mg via INTRAVENOUS
  Administered 2021-04-18: 40 mg via INTRAVENOUS

## 2021-04-18 MED ORDER — FENTANYL CITRATE (PF) 100 MCG/2ML IJ SOLN
INTRAMUSCULAR | Status: AC
  Administered 2021-04-18: 100 ug via INTRAVENOUS
  Filled 2021-04-18: qty 2

## 2021-04-18 MED ORDER — CHLORHEXIDINE GLUCONATE 0.12 % MT SOLN: 15.0000 mL | Freq: Once | OROMUCOSAL | Status: AC

## 2021-04-18 MED ORDER — ALBUTEROL SULFATE HFA 108 (90 BASE) MCG/ACT IN AERS
INHALATION_SPRAY | RESPIRATORY_TRACT | Status: DC | PRN
  Administered 2021-04-18: 15 via RESPIRATORY_TRACT

## 2021-04-18 MED ORDER — ACETAMINOPHEN 10 MG/ML IV SOLN
INTRAVENOUS | Status: DC | PRN
  Administered 2021-04-18: 1000 mg via INTRAVENOUS

## 2021-04-18 MED ORDER — HYDROMORPHONE HCL 1 MG/ML IJ SOLN
0.2500 mg | INTRAMUSCULAR | Status: DC | PRN
Start: 2021-04-18 — End: 2021-04-19

## 2021-04-18 MED ORDER — ACETAMINOPHEN 500 MG PO TABS: 1000.0000 mg | ORAL_TABLET | Freq: Once | ORAL | Status: DC

## 2021-04-18 MED ORDER — BUPIVACAINE HCL (PF) 0.25 % IJ SOLN
INTRAMUSCULAR | Status: DC | PRN
  Administered 2021-04-18: 20 mL

## 2021-04-18 MED ORDER — LACTATED RINGERS IV SOLN: INTRAVENOUS | Status: DC

## 2021-04-18 MED ORDER — 0.9 % SODIUM CHLORIDE (POUR BTL) OPTIME
TOPICAL | Status: DC | PRN
  Administered 2021-04-18: 2000 mL

## 2021-04-18 MED ORDER — EPHEDRINE 5 MG/ML INJ
INTRAVENOUS | Status: AC
  Filled 2021-04-18: qty 10

## 2021-04-18 MED ORDER — FENTANYL CITRATE (PF) 100 MCG/2ML IJ SOLN
INTRAMUSCULAR | Status: DC | PRN
  Administered 2021-04-18: 50 ug via INTRAVENOUS

## 2021-04-18 MED ORDER — ORAL CARE MOUTH RINSE: 15.0000 mL | Freq: Once | OROMUCOSAL | Status: AC

## 2021-04-18 MED ORDER — ONDANSETRON 4 MG PO TBDP: 4.0000 mg | ORAL_TABLET | Freq: Three times a day (TID) | ORAL | 0 refills | Status: DC | PRN

## 2021-04-18 MED ORDER — PROPOFOL 10 MG/ML IV BOLUS
INTRAVENOUS | Status: DC | PRN
  Administered 2021-04-18: 100 mg via INTRAVENOUS

## 2021-04-18 MED ORDER — ALBUTEROL SULFATE HFA 108 (90 BASE) MCG/ACT IN AERS
INHALATION_SPRAY | RESPIRATORY_TRACT | Status: AC
  Filled 2021-04-18: qty 6.7

## 2021-04-18 MED ORDER — SUGAMMADEX SODIUM 200 MG/2ML IV SOLN
INTRAVENOUS | Status: DC | PRN
  Administered 2021-04-18: 200 mg via INTRAVENOUS

## 2021-04-18 MED ORDER — FENTANYL CITRATE (PF) 100 MCG/2ML IJ SOLN: 100.0000 ug | Freq: Once | INTRAMUSCULAR | Status: AC

## 2021-04-18 MED ORDER — PHENYLEPHRINE HCL (PRESSORS) 10 MG/ML IV SOLN
INTRAVENOUS | Status: AC
  Filled 2021-04-18: qty 1

## 2021-04-18 MED ORDER — PROPOFOL 10 MG/ML IV BOLUS
INTRAVENOUS | Status: AC
  Filled 2021-04-18: qty 20

## 2021-04-18 MED ORDER — IPRATROPIUM-ALBUTEROL 0.5-2.5 (3) MG/3ML IN SOLN
RESPIRATORY_TRACT | Status: AC
  Filled 2021-04-18: qty 3

## 2021-04-18 MED ORDER — CEFAZOLIN SODIUM-DEXTROSE 1-4 GM/50ML-% IV SOLN
1.0000 g | Freq: Three times a day (TID) | INTRAVENOUS | Status: DC
  Administered 2021-04-18: 1 g via INTRAVENOUS
  Filled 2021-04-18: qty 50

## 2021-04-18 MED ORDER — PHENYLEPHRINE 40 MCG/ML (10ML) SYRINGE FOR IV PUSH (FOR BLOOD PRESSURE SUPPORT)
PREFILLED_SYRINGE | INTRAVENOUS | Status: DC | PRN
  Administered 2021-04-18 (×2): 120 ug via INTRAVENOUS
  Administered 2021-04-18: 80 ug via INTRAVENOUS

## 2021-04-18 MED ORDER — PHENYLEPHRINE HCL-NACL 10-0.9 MG/250ML-% IV SOLN
INTRAVENOUS | Status: DC | PRN
  Administered 2021-04-18: 50 ug/min via INTRAVENOUS

## 2021-04-18 SURGICAL SUPPLY — 60 items
AID PSTN UNV HD RSTRNT DISP (MISCELLANEOUS) ×1
ALCOHOL 70% 16 OZ (MISCELLANEOUS) ×1 IMPLANT
BIT DRILL 5/64X5 DISP (BIT) IMPLANT
BLADE SAG 18X100X1.27 (BLADE) ×2 IMPLANT
BLADE SAW SGTL 81X20 HD (BLADE) ×1 IMPLANT
BOWL SMART MIX CTS (DISPOSABLE) ×1 IMPLANT
CEMENT GENTAMICIN CEMEX 40G (Cement) ×1 IMPLANT
COVER SURGICAL LIGHT HANDLE (MISCELLANEOUS) ×2 IMPLANT
COVER WAND RF STERILE (DRAPES) ×2 IMPLANT
DRAPE DERMATAC (DRAPES) ×2 IMPLANT
DRAPE INCISE IOBAN 66X45 STRL (DRAPES) ×2 IMPLANT
DRAPE ORTHO SPLIT 77X108 STRL (DRAPES) ×4
DRAPE SURG ORHT 6 SPLT 77X108 (DRAPES) ×2 IMPLANT
DRAPE U-SHAPE 47X51 STRL (DRAPES) ×2 IMPLANT
DRESSING PEEL AND PLC PRVNA 13 (GAUZE/BANDAGES/DRESSINGS) IMPLANT
DRSG ADAPTIC 3X8 NADH LF (GAUZE/BANDAGES/DRESSINGS) ×1 IMPLANT
DRSG PAD ABDOMINAL 8X10 ST (GAUZE/BANDAGES/DRESSINGS) ×1 IMPLANT
DRSG PEEL AND PLACE PREVENA 13 (GAUZE/BANDAGES/DRESSINGS) ×2
DURAPREP 26ML APPLICATOR (WOUND CARE) ×3 IMPLANT
ELECT BLADE 4.0 EZ CLEAN MEGAD (MISCELLANEOUS) ×2
ELECT REM PT RETURN 9FT ADLT (ELECTROSURGICAL) ×2
ELECTRODE BLDE 4.0 EZ CLN MEGD (MISCELLANEOUS) ×1 IMPLANT
ELECTRODE REM PT RTRN 9FT ADLT (ELECTROSURGICAL) ×1 IMPLANT
GAUZE SPONGE 4X4 12PLY STRL (GAUZE/BANDAGES/DRESSINGS) ×1 IMPLANT
GLOVE BIO SURGEON STRL SZ7.5 (GLOVE) ×4 IMPLANT
GLOVE SRG 8 PF TXTR STRL LF DI (GLOVE) ×2 IMPLANT
GLOVE SURG UNDER POLY LF SZ8 (GLOVE) ×4
GOWN STRL REUS W/ TWL LRG LVL3 (GOWN DISPOSABLE) ×1 IMPLANT
GOWN STRL REUS W/ TWL XL LVL3 (GOWN DISPOSABLE) ×2 IMPLANT
GOWN STRL REUS W/TWL LRG LVL3 (GOWN DISPOSABLE) ×2
GOWN STRL REUS W/TWL XL LVL3 (GOWN DISPOSABLE) ×4
KIT BASIN OR (CUSTOM PROCEDURE TRAY) ×2 IMPLANT
KIT SHOULDER SPACER 11MM STEM (Shoulder) ×1 IMPLANT
KIT TURNOVER KIT B (KITS) ×2 IMPLANT
MANIFOLD NEPTUNE II (INSTRUMENTS) ×2 IMPLANT
NDL TAPERED W/ NITINOL LOOP (MISCELLANEOUS) ×1 IMPLANT
NEEDLE TAPERED W/ NITINOL LOOP (MISCELLANEOUS) ×2 IMPLANT
NS IRRIG 1000ML POUR BTL (IV SOLUTION) ×2 IMPLANT
PACK SHOULDER (CUSTOM PROCEDURE TRAY) ×2 IMPLANT
PAD ARMBOARD 7.5X6 YLW CONV (MISCELLANEOUS) ×2 IMPLANT
RESTRAINT HEAD UNIVERSAL NS (MISCELLANEOUS) ×2 IMPLANT
SLING ARM FOAM STRAP LRG (SOFTGOODS) IMPLANT
SLING ARM FOAM STRAP MED (SOFTGOODS) ×1 IMPLANT
SMARTMIX MINI TOWER (MISCELLANEOUS)
SPONGE LAP 18X18 RF (DISPOSABLE) ×2 IMPLANT
SPONGE LAP 4X18 RFD (DISPOSABLE) IMPLANT
STRIP CLOSURE SKIN 1/2X4 (GAUZE/BANDAGES/DRESSINGS) ×2 IMPLANT
SUCTION FRAZIER HANDLE 10FR (MISCELLANEOUS) ×2
SUCTION TUBE FRAZIER 10FR DISP (MISCELLANEOUS) ×1 IMPLANT
SUT FIBERWIRE #2 38 T-5 BLUE (SUTURE)
SUT MNCRL AB 4-0 PS2 18 (SUTURE) ×1 IMPLANT
SUT MON AB 2-0 CT1 36 (SUTURE) ×2 IMPLANT
SUT PDS AB 1 CT1 36 (SUTURE) ×3 IMPLANT
SUT VIC AB 2-0 CT1 27 (SUTURE) ×2
SUT VIC AB 2-0 CT1 TAPERPNT 27 (SUTURE) ×1 IMPLANT
SUTURE FIBERWR #2 38 T-5 BLUE (SUTURE) ×2 IMPLANT
TOWEL GREEN STERILE (TOWEL DISPOSABLE) ×2 IMPLANT
TOWER SMARTMIX MINI (MISCELLANEOUS) IMPLANT
WATER STERILE IRR 1000ML POUR (IV SOLUTION) ×2 IMPLANT
YANKAUER SUCT BULB TIP NO VENT (SUCTIONS) ×2 IMPLANT

## 2021-04-18 NOTE — Brief Op Note (Signed)
04/18/2021  4:32 PM  PATIENT:  Alphons Burgert Ringold  50 y.o. male  PRE-OPERATIVE DIAGNOSIS:  Right shoulder hemiarthroplasty infection  POST-OPERATIVE DIAGNOSIS: Same  PROCEDURE:  Procedure(s) with comments: EXCISIONAL TOTAL SHOULDER ARTHROPLASTY WITH ANTIBIOTIC SPACER (Right) - 2hrs procedure start time at 2:45pm please  SURGEON:  Surgeon(s) and Role:    * Stann Mainland, Elly Modena, MD - Primary  PHYSICIAN ASSISTANT: Jonelle Sidle, PA-C  ANESTHESIA:   regional and general  EBL:  100 cc  BLOOD ADMINISTERED:none  DRAINS: incisional VAC   LOCAL MEDICATIONS USED:  NONE  SPECIMEN:  No Specimen  DISPOSITION OF SPECIMEN:  N/A  COUNTS:  YES  TOURNIQUET:  * No tourniquets in log *  DICTATION: .Note written in EPIC  PLAN OF CARE: Discharge to home after PACU  PATIENT DISPOSITION:  PACU - hemodynamically stable.   Delay start of Pharmacological VTE agent (>24hrs) due to surgical blood loss or risk of bleeding: not applicable
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
## 2021-04-18 NOTE — Discharge Instructions (Signed)
-   Maintain postoperative bandage as is.  The negative pressure dressing should stay on for 1 week.  He will follow-up with Dr. Stann Mainland next week in the office, and he will remove this bandage.  If the canister becomes full you can remove and dump down the toilet and reattached the canister.  -Maintain sling on the right arm at all times until your follow-up appointment.  -Do not get your incision wet.  -Apply ice to your shoulder liberally for 20 to 30 minutes out of each hour that you are able around-the-clock.  -Return to see Dr. Stann Mainland in 1 week to remove your postoperative wound VAC.

## 2021-04-18 NOTE — Progress Notes (Signed)
02 via [place] applied at 3l saturations 92-94%

## 2021-04-18 NOTE — Progress Notes (Signed)
Have attempted to remove 02 to monitor saturations on RA saturations drop to low 80s and 02 reapplied 5l via [place] at different times saturation 93% on 3l via [place]

## 2021-04-18 NOTE — Anesthesia Postprocedure Evaluation (Signed)
Anesthesia Post Note  Patient: Ernest Reese  Procedure(s) Performed: EXCISIONAL TOTAL SHOULDER ARTHROPLASTY WITH ANTIBIOTIC SPACER (Right Shoulder)     Patient location during evaluation: PACU Anesthesia Type: General and Regional Level of consciousness: awake and alert Pain management: pain level controlled Vital Signs Assessment: post-procedure vital signs reviewed and stable Respiratory status: spontaneous breathing, nonlabored ventilation, respiratory function stable and patient connected to nasal cannula oxygen Cardiovascular status: blood pressure returned to baseline and stable Postop Assessment: no apparent nausea or vomiting Anesthetic complications: no Comments: - Patient with baseline poor respiratory function with diffuse expiratory wheezes in pre-operative setting. Patient was informed pre-operatively the chance he would need admission given his poor pulmonary baseline status. Post procedure patient received multiple breathing treatments without improvement in mechanics and persistent hypoxia. Patient signing out AMA despite request of surgeon and anesthesia team. Patient aware of risks but wishes to proceed.    No complications documented.  Last Vitals:  Vitals:   04/18/21 1850 04/18/21 1905  BP: 139/84 131/89  Pulse: 95 95  Resp: 12 12  Temp:  (!) 36.4 C  SpO2: 94% 92%    Last Pain:  Vitals:   04/18/21 1905  TempSrc:   PainSc: 0-No pain                 Belenda Cruise P Chamar Broughton

## 2021-04-18 NOTE — Progress Notes (Signed)
1920 Dr Gloris Manchester at bedside to discuss respiratory status with patient patient stating he is going home AMA documentation signed by patient earlier

## 2021-04-18 NOTE — Anesthesia Procedure Notes (Signed)
Procedure Name: Intubation Date/Time: 04/18/2021 3:23 PM Performed by: Georgia Duff, CRNA Pre-anesthesia Checklist: Patient identified, Emergency Drugs available, Suction available and Patient being monitored Patient Re-evaluated:Patient Re-evaluated prior to induction Oxygen Delivery Method: Circle System Utilized Preoxygenation: Pre-oxygenation with 100% oxygen Induction Type: IV induction Ventilation: Mask ventilation without difficulty Laryngoscope Size: Miller and 3 Tube type: Oral Tube size: 7.5 mm Number of attempts: 1 Airway Equipment and Method: Stylet and Oral airway Placement Confirmation: ETT inserted through vocal cords under direct vision,  positive ETCO2 and breath sounds checked- equal and bilateral Secured at: 22 cm Tube secured with: Tape Dental Injury: Teeth and Oropharynx as per pre-operative assessment

## 2021-04-18 NOTE — Progress Notes (Signed)
Patient continues to wheeze expiratory saturations 81-86% RA 02 at 4l [place] saturations at 94% attempted to reach Dr. Stann Mainland to make aware of respiratory status Dr. Gloris Manchester made aware of saturations on RA and 02 patient admit he is going home

## 2021-04-18 NOTE — Transfer of Care (Signed)
Immediate Anesthesia Transfer of Care Note  Patient: Ernest Reese  Procedure(s) Performed: EXCISIONAL TOTAL SHOULDER ARTHROPLASTY WITH ANTIBIOTIC SPACER (Right Shoulder)  Patient Location: PACU  Anesthesia Type:General  Level of Consciousness: drowsy and patient cooperative  Airway & Oxygen Therapy: Patient Spontanous Breathing and Patient connected to face mask oxygen  Post-op Assessment: Report given to RN and Post -op Vital signs reviewed and stable  Post vital signs: Reviewed and stable  Last Vitals:  Vitals Value Taken Time  BP 146/88 04/18/21 1735  Temp    Pulse 96 04/18/21 1740  Resp 11 04/18/21 1740  SpO2 98 % 04/18/21 1740  Vitals shown include unvalidated device data.  Last Pain:  Vitals:   04/18/21 1455  TempSrc:   PainSc: 0-No pain      Patients Stated Pain Goal: 3 (20/25/42 7062)  Complications: No complications documented.

## 2021-04-18 NOTE — Progress Notes (Signed)
Spoke with Cherlynn June, PA regarding patient's low O2- 78-84% on 4L 02, patient is adamit he going home, he states he has someone in the waiting room to drive him home and be with him for 24 hours. He is willing to sign AMA and understands his insurance may not cover he surgery cost.   Patient states he knows his body and this is the way that he is on a daily basis. Patient educate on the importance of his condition and the need to stay at the hospital and patient is choosing to sign AMA.  Fritz Pickerel, [place] and Dr. Rex Kras made aware.

## 2021-04-18 NOTE — Op Note (Signed)
Date of Surgery: 04/18/2021  INDICATIONS: Ernest Reese is a 50 y.o.-year-old male with a right prosthetic shoulder infection.  He was initially treated with a hemiarthroplasty of the right shoulder for significant arthropathy as well as dysplasia of the glenoid.  In the acute.  He presented with acute hematogenous Staphylococcus infection.  He has history of IV drug use and had a disseminated staph and was in the ICU for a number of days.  He was treated with an open irrigation debridement of the right shoulder at that time.  However, following discharge, he did not follow-up with infectious disease, and did not take his post hospital antibiotics.  He has had increasing pain and swelling in the right shoulder.  My concern is for now a chronically infected right hemiarthroplasty.  He is here today for explantation of the hemiarthroplasty and antibiotic spacer placement.;  The patient did consent to the procedure after discussion of the risks and benefits.  PREOPERATIVE DIAGNOSIS:  Right shoulder infected hemiarthroplasty  POSTOPERATIVE DIAGNOSIS: Same.  PROCEDURE:  1.  Explantation of right shoulder hemiarthroplasty 2.  Implantation of antibiotic spacer for right hemiarthroplasty.  Shoulder. 3.  Application of negative pressure dressing for incisional VAC over the incision.  SURGEON: Geralynn Rile, M.D.  ASSIST: Jonelle Sidle, PA-C  Assistant attestation:  PA Mcclung was utilized throughout the procedure for positioning the patient, approach to the shoulder, explantation of deep implant as well as implanting of the antibiotic spacer.  He was also utilized for closure and application of wound VAC.Marland Kitchen  ANESTHESIA:  general, interscalene  IV FLUIDS AND URINE: See anesthesia.  ESTIMATED BLOOD LOSS: 200 mL.  IMPLANTS:  [employer] small antibiotic spacer with a 7 mm stem and 41 mm humeral head   DRAINS:  1 negative pressure dressing for wound VAC  COMPLICATIONS: None.  DESCRIPTION OF  PROCEDURE: The patient was brought to the operating room and placed supine on the operating table.  The patient had been signed prior to the procedure and this was documented. The patient had the anesthesia placed by the anesthesiologist.  A time-out was performed to confirm that this was the correct patient, site, side and location. The patient did receive antibiotics prior to the incision and was re-dosed during the procedure as needed at indicated intervals.  A tourniquet was not placed.  The patient had the operative extremity prepped and draped in the standard surgical fashion.     The right shoulder was then approached via the previously utilized deltopectoral incision.  We encountered some subcutaneous purulence along the inferior half of the incision.  We then entered the deltopectoral deep space, and in this area we did again encounter some fluid that was dishwater in appearance.  We then removed the scar tissue and fibrotic tissue from the proximal humerus.  There was some signs of infected appearing residual rotator cuff tendon noted along the anterior glenoid.  Deep retractors were placed.  We then began the extraction of the humeral hemiarthroplasty.  The humeral head was removed with the tuning fork device.  We then exposed the proximal humerus.  We rongeured away fibrotic tissue.  We then applied the extraction device to the stem.  While using back slap technique we were able to explant the stem.  Next, we moved to copiously irrigate and debride the shoulder joint itself.  Utilizing rondure as well as curette and Bovie we removed any nonviable tissue and we did also perform a revision osteotomy of the proximal humerus to completely remove  any bone that was appearing a bit infected.  We then copiously irrigated the wound with 2 L of normal saline.  Next, we trialed the shoulder with the small and large implant trials.  The small was found to fit quite nicely.  We then opened the small antibiotic  spacer with the 41 mm head.  We implanted this with a metaphyseal submit technique.  We allowed the cement to dry and then did reduce the shoulder and found that this was a good fit.    We did get to irrigate the wound once again with normal saline.  We then closed the wound in layers.  Using a #1 PDS for the deltopectoral interval, and 2-0 Monocryl for the subcutaneous tissue and staples for the skin.  Due to the swelling and concern for wound issues, we then elected to apply a negative pressure wound VAC dressing for the incision.  We applied an Adaptic along the incision itself, and then the wound VAC sponge across the incision.  We then secured this with Ioban strips.  We checked the suction and found there was no seal leak.  We then applied the wound VAC device.  This had excellent seal.  The patient was then placed in a right arm sling.  He was awakened from general anesthetic in stable condition and transported to PACU.  POSTOPERATIVE PLAN:  Patient will be nonweightbearing to the right upper extremity for approximately 2 weeks.  We will begin the sling.  We will monitor him in the recovery unit and plan to discharge home if he is stable.  We did discuss with the patient preoperatively, that if he has any issues with low saturation such as he had with his previous surgery we would keep him overnight.  He was amenable to that.  He will follow-up with me in 2 weeks.

## 2021-04-18 NOTE — Anesthesia Procedure Notes (Signed)
Anesthesia Regional Block: Interscalene brachial plexus block   Pre-Anesthetic Checklist: ,, timeout performed, Correct Patient, Correct Site, Correct Laterality, Correct Procedure, Correct Position, site marked, Risks and benefits discussed, pre-op evaluation,  At surgeon's request and post-op pain management  Laterality: Right  Prep: Maximum Sterile Barrier Precautions used, chloraprep       Needles:  Injection technique: Single-shot  Needle Type: Echogenic Stimulator Needle     Needle Length: 5cm  Needle Gauge: 22     Additional Needles:   Procedures:,,,, ultrasound used (permanent image in chart),,,,  Narrative:  Start time: 04/18/2021 2:40 PM End time: 04/18/2021 2:50 PM Injection made incrementally with aspirations every 5 mL. Anesthesiologist: Roderic Palau, MD  Additional Notes: 2% Lidocaine skin wheel.

## 2021-04-21 ENCOUNTER — Encounter (HOSPITAL_COMMUNITY): Payer: Self-pay | Admitting: Orthopedic Surgery

## 2021-05-13 ENCOUNTER — Other Ambulatory Visit: Payer: Self-pay

## 2021-05-13 ENCOUNTER — Encounter: Payer: Self-pay | Admitting: Internal Medicine

## 2021-05-13 ENCOUNTER — Ambulatory Visit (INDEPENDENT_AMBULATORY_CARE_PROVIDER_SITE_OTHER): Payer: Medicare HMO | Admitting: Internal Medicine

## 2021-05-13 ENCOUNTER — Telehealth: Payer: Self-pay

## 2021-05-13 VITALS — BP 119/82 | HR 103 | Wt 167.0 lb

## 2021-05-13 DIAGNOSIS — I330 Acute and subacute infective endocarditis: Secondary | ICD-10-CM | POA: Diagnosis not present

## 2021-05-13 DIAGNOSIS — M00011 Staphylococcal arthritis, right shoulder: Secondary | ICD-10-CM

## 2021-05-13 DIAGNOSIS — R7881 Bacteremia: Secondary | ICD-10-CM | POA: Diagnosis not present

## 2021-05-13 MED ORDER — CEFADROXIL 500 MG PO CAPS: 1000.0000 mg | ORAL_CAPSULE | Freq: Two times a day (BID) | ORAL | 0 refills | Status: AC

## 2021-05-13 MED ORDER — DOXYCYCLINE HYCLATE 100 MG PO TABS
100.0000 mg | ORAL_TABLET | Freq: Two times a day (BID) | ORAL | 2 refills | Status: DC
Start: 2021-05-13 — End: 2021-06-09
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
## 2021-05-13 NOTE — Patient Instructions (Addendum)
Please see your orthopedic surgeon within 1 to 2 weeks to discuss evaluating your right knee pain and ongoing discharge of the right shoulder  I will restart you on antibiotics as following: 1) two infusion of dalbavancin spaced by a week 2) take doxycycline 100 mg by mouth twice a day  3) as you can not get in till June 02 for your dalbavancin, will also give you cefadroxil 1000 mg to take twice a day until then only (the doxycycline will be continued beyond that as mentioned above)   Do not stop antibiotics unless you discuss with [place]   See [place] in 2 weeks   Blood tests today here in our clinic

## 2021-05-13 NOTE — Telephone Encounter (Signed)
Orders faxed to Short stay for dose #1 of dalbavancin. Unable to get infusion until 6/2 @ 9am. Patient instructed to start oral abx and follow up with surgeon in 1 week. Patient to follow up with [facility] in 2 weeks.  Monai Hindes Lorita Officer, RN

## 2021-05-14 LAB — COMPREHENSIVE METABOLIC PANEL
AG Ratio: 1.1 (calc) (ref 1.0–2.5)
ALT: 11 U/L (ref 9–46)
AST: 16 U/L (ref 10–40)
Albumin: 4 g/dL (ref 3.6–5.1)
Alkaline phosphatase (APISO): 88 U/L (ref 36–130)
BUN/Creatinine Ratio: 9 (calc) (ref 6–22)
BUN: 13 mg/dL (ref 7–25)
CO2: 30 mmol/L (ref 20–32)
Calcium: 9.1 mg/dL (ref 8.6–10.3)
Chloride: 102 mmol/L (ref 98–110)
Creat: 1.48 mg/dL — ABNORMAL HIGH (ref 0.60–1.35)
Globulin: 3.5 g/dL (calc) (ref 1.9–3.7)
Glucose, Bld: 84 mg/dL (ref 65–99)
Potassium: 5 mmol/L (ref 3.5–5.3)
Sodium: 139 mmol/L (ref 135–146)
Total Bilirubin: 0.3 mg/dL (ref 0.2–1.2)
Total Protein: 7.5 g/dL (ref 6.1–8.1)

## 2021-05-14 LAB — CBC WITH DIFFERENTIAL/PLATELET
Absolute Monocytes: 910 cells/uL (ref 200–950)
Basophils Absolute: 22 cells/uL (ref 0–200)
Basophils Relative: 0.3 %
Eosinophils Absolute: 104 cells/uL (ref 15–500)
Eosinophils Relative: 1.4 %
HCT: 31.7 % — ABNORMAL LOW (ref 38.5–50.0)
Hemoglobin: 9.2 g/dL — ABNORMAL LOW (ref 13.2–17.1)
Lymphs Abs: 2501 cells/uL (ref 850–3900)
MCH: 23.3 pg — ABNORMAL LOW (ref 27.0–33.0)
MCHC: 29 g/dL — ABNORMAL LOW (ref 32.0–36.0)
MCV: 80.3 fL (ref 80.0–100.0)
MPV: 8.2 fL (ref 7.5–12.5)
Monocytes Relative: 12.3 %
Neutro Abs: 3863 cells/uL (ref 1500–7800)
Neutrophils Relative %: 52.2 %
Platelets: 389 10*3/uL (ref 140–400)
RBC: 3.95 10*6/uL — ABNORMAL LOW (ref 4.20–5.80)
RDW: 15.3 % — ABNORMAL HIGH (ref 11.0–15.0)
Total Lymphocyte: 33.8 %
WBC: 7.4 10*3/uL (ref 3.8–10.8)

## 2021-05-14 LAB — C-REACTIVE PROTEIN: CRP: 32.1 mg/L — ABNORMAL HIGH (ref <8.0)

## 2021-05-23 ENCOUNTER — Telehealth: Payer: Self-pay

## 2021-05-23 NOTE — Telephone Encounter (Signed)
Patient called, states he went to the surgeon to have his staples removed. He says he has a "hole" in his arm and feels like one of the staples broke off and is still in the incision. He states the surgeon's office told him to call Infectious Disease. RN advised patient that we do not manage staple removal or incisions and that he will either have to have his surgeon evaluate the site or go to the emergency department for evaluation. Patient verbalized understanding and has no further questions.   Beryle Flock, RN

## 2021-05-28 ENCOUNTER — Other Ambulatory Visit (HOSPITAL_COMMUNITY): Payer: Self-pay

## 2021-05-29 ENCOUNTER — Inpatient Hospital Stay (HOSPITAL_COMMUNITY): Admission: RE | Admit: 2021-05-29 | Payer: Medicare HMO | Source: Ambulatory Visit

## 2021-06-03 ENCOUNTER — Ambulatory Visit: Payer: Medicare HMO | Admitting: Internal Medicine

## 2021-06-05 ENCOUNTER — Ambulatory Visit (HOSPITAL_COMMUNITY): Payer: Medicare HMO

## 2021-06-09 ENCOUNTER — Other Ambulatory Visit: Payer: Self-pay

## 2021-06-09 ENCOUNTER — Encounter (HOSPITAL_COMMUNITY): Payer: Self-pay

## 2021-06-09 ENCOUNTER — Ambulatory Visit (INDEPENDENT_AMBULATORY_CARE_PROVIDER_SITE_OTHER): Payer: Medicare HMO

## 2021-06-09 ENCOUNTER — Ambulatory Visit (HOSPITAL_COMMUNITY)
Admission: EM | Admit: 2021-06-09 | Discharge: 2021-06-09 | Disposition: A | Discharge status: Home / Self Care | Payer: Medicare HMO | Attending: Emergency Medicine | Admitting: Emergency Medicine

## 2021-06-09 DIAGNOSIS — L03113 Cellulitis of right upper limb: Secondary | ICD-10-CM

## 2021-06-09 MED ORDER — CEFADROXIL 500 MG PO CAPS: 1000.0000 mg | ORAL_CAPSULE | Freq: Two times a day (BID) | ORAL | 0 refills | Status: AC

## 2021-06-09 MED ORDER — IBUPROFEN 600 MG PO TABS: 600.0000 mg | ORAL_TABLET | Freq: Four times a day (QID) | ORAL | 0 refills | Status: DC | PRN

## 2021-06-09 NOTE — Discharge Instructions (Addendum)
Your x-ray was negative for any retained staple.   You have a skin infection. You need to be on antibiotics. Start the cefadroxil.  Dr. Stann Mainland will be happy to see you in the office ASAP.  Please call his office tomorrow and make an appointment.  You also need to be followed up by infectious disease to be started on your antibiotic infusions.  You may take 600 mg of ibuprofen combined with 1000 mg of Tylenol together 3-4 times a day as needed for pain.  Go immediately to the ED for fevers above 100.4, if the redness goes significantly past the marker line despite being on the antibiotics for 24 hours

## 2021-06-09 NOTE — ED Triage Notes (Addendum)
Pt with concern that staple is embedded in right shoulder where they were removed about on month ago (post-surgical). Some red discoloration/streaking noted around area.  Reports stopped taking prescribed doxycycline prescribed after surgery. Reports doctor who did the surgery told him to stop taking it due to vomiting.
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
## 2021-06-23 ENCOUNTER — Emergency Department (HOSPITAL_COMMUNITY): Payer: Medicare HMO

## 2021-06-23 ENCOUNTER — Encounter (HOSPITAL_COMMUNITY): Payer: Self-pay | Admitting: Emergency Medicine

## 2021-06-23 ENCOUNTER — Other Ambulatory Visit: Payer: Self-pay

## 2021-06-23 ENCOUNTER — Emergency Department (HOSPITAL_COMMUNITY)
Admission: EM | Admit: 2021-06-23 | Discharge: 2021-06-23 | Disposition: A | Discharge status: Home / Self Care | Payer: Medicare HMO | Attending: Emergency Medicine | Admitting: Emergency Medicine

## 2021-06-23 DIAGNOSIS — J441 Chronic obstructive pulmonary disease with (acute) exacerbation: Secondary | ICD-10-CM

## 2021-06-23 DIAGNOSIS — Z20822 Contact with and (suspected) exposure to covid-19: Secondary | ICD-10-CM | POA: Diagnosis not present

## 2021-06-23 DIAGNOSIS — Z8547 Personal history of malignant neoplasm of testis: Secondary | ICD-10-CM | POA: Insufficient documentation

## 2021-06-23 DIAGNOSIS — G588 Other specified mononeuropathies: Secondary | ICD-10-CM | POA: Diagnosis not present

## 2021-06-23 DIAGNOSIS — G589 Mononeuropathy, unspecified: Secondary | ICD-10-CM

## 2021-06-23 DIAGNOSIS — R0602 Shortness of breath: Secondary | ICD-10-CM | POA: Diagnosis present

## 2021-06-23 DIAGNOSIS — Z96611 Presence of right artificial shoulder joint: Secondary | ICD-10-CM | POA: Insufficient documentation

## 2021-06-23 DIAGNOSIS — F17210 Nicotine dependence, cigarettes, uncomplicated: Secondary | ICD-10-CM | POA: Insufficient documentation

## 2021-06-23 DIAGNOSIS — Z96659 Presence of unspecified artificial knee joint: Secondary | ICD-10-CM | POA: Diagnosis not present

## 2021-06-23 LAB — CBC WITH DIFFERENTIAL/PLATELET
Abs Immature Granulocytes: 0.02 10*3/uL (ref 0.00–0.07)
Basophils Absolute: 0 10*3/uL (ref 0.0–0.1)
Basophils Relative: 0 %
Eosinophils Absolute: 0.2 10*3/uL (ref 0.0–0.5)
Eosinophils Relative: 2 %
HCT: 35 % — ABNORMAL LOW (ref 39.0–52.0)
Hemoglobin: 9.2 g/dL — ABNORMAL LOW (ref 13.0–17.0)
Immature Granulocytes: 0 %
Lymphocytes Relative: 29 %
Lymphs Abs: 2.4 10*3/uL (ref 0.7–4.0)
MCH: 20.7 pg — ABNORMAL LOW (ref 26.0–34.0)
MCHC: 26.3 g/dL — ABNORMAL LOW (ref 30.0–36.0)
MCV: 78.8 fL — ABNORMAL LOW (ref 80.0–100.0)
Monocytes Absolute: 0.8 10*3/uL (ref 0.1–1.0)
Monocytes Relative: 10 %
Neutro Abs: 4.8 10*3/uL (ref 1.7–7.7)
Neutrophils Relative %: 59 %
Platelets: 374 10*3/uL (ref 150–400)
RBC: 4.44 MIL/uL (ref 4.22–5.81)
RDW: 16.5 % — ABNORMAL HIGH (ref 11.5–15.5)
WBC: 8.3 10*3/uL (ref 4.0–10.5)
nRBC: 0 % (ref 0.0–0.2)

## 2021-06-23 LAB — I-STAT ARTERIAL BLOOD GAS, ED
Acid-Base Excess: 6 mmol/L — ABNORMAL HIGH (ref 0.0–2.0)
Bicarbonate: 33.5 mmol/L — ABNORMAL HIGH (ref 20.0–28.0)
Calcium, Ion: 1.23 mmol/L (ref 1.15–1.40)
HCT: 31 % — ABNORMAL LOW (ref 39.0–52.0)
Hemoglobin: 10.5 g/dL — ABNORMAL LOW (ref 13.0–17.0)
O2 Saturation: 92 %
Potassium: 4.1 mmol/L (ref 3.5–5.1)
Sodium: 140 mmol/L (ref 135–145)
TCO2: 35 mmol/L — ABNORMAL HIGH (ref 22–32)
pCO2 arterial: 63.9 mmHg — ABNORMAL HIGH (ref 32.0–48.0)
pH, Arterial: 7.327 — ABNORMAL LOW (ref 7.350–7.450)
pO2, Arterial: 71 mmHg — ABNORMAL LOW (ref 83.0–108.0)

## 2021-06-23 LAB — I-STAT CHEM 8, ED
BUN: 15 mg/dL (ref 6–20)
Calcium, Ion: 1.08 mmol/L — ABNORMAL LOW (ref 1.15–1.40)
Chloride: 101 mmol/L (ref 98–111)
Creatinine, Ser: 1.4 mg/dL — ABNORMAL HIGH (ref 0.61–1.24)
Glucose, Bld: 142 mg/dL — ABNORMAL HIGH (ref 70–99)
HCT: 32 % — ABNORMAL LOW (ref 39.0–52.0)
Hemoglobin: 10.9 g/dL — ABNORMAL LOW (ref 13.0–17.0)
Potassium: 4.2 mmol/L (ref 3.5–5.1)
Sodium: 138 mmol/L (ref 135–145)
TCO2: 30 mmol/L (ref 22–32)

## 2021-06-23 LAB — COMPREHENSIVE METABOLIC PANEL
ALT: 15 U/L (ref 0–44)
AST: 29 U/L (ref 15–41)
Albumin: 3.3 g/dL — ABNORMAL LOW (ref 3.5–5.0)
Alkaline Phosphatase: 77 U/L (ref 38–126)
Anion gap: 8 (ref 5–15)
BUN: 13 mg/dL (ref 6–20)
CO2: 29 mmol/L (ref 22–32)
Calcium: 8.9 mg/dL (ref 8.9–10.3)
Chloride: 100 mmol/L (ref 98–111)
Creatinine, Ser: 1.47 mg/dL — ABNORMAL HIGH (ref 0.61–1.24)
GFR, Estimated: 58 mL/min — ABNORMAL LOW (ref >60)
Glucose, Bld: 146 mg/dL — ABNORMAL HIGH (ref 70–99)
Potassium: 4.2 mmol/L (ref 3.5–5.1)
Sodium: 137 mmol/L (ref 135–145)
Total Bilirubin: 0.2 mg/dL — ABNORMAL LOW (ref 0.3–1.2)
Total Protein: 7.7 g/dL (ref 6.5–8.1)

## 2021-06-23 LAB — BRAIN NATRIURETIC PEPTIDE: B Natriuretic Peptide: 47 pg/mL (ref 0.0–100.0)

## 2021-06-23 LAB — TROPONIN I (HIGH SENSITIVITY)
Troponin I (High Sensitivity): 10 ng/L (ref <18)
Troponin I (High Sensitivity): 8 ng/L (ref <18)

## 2021-06-23 LAB — RESP PANEL BY RT-PCR (FLU A&B, COVID) ARPGX2
Influenza A by PCR: NEGATIVE
Influenza B by PCR: NEGATIVE
SARS Coronavirus 2 by RT PCR: NEGATIVE

## 2021-06-23 MED ORDER — ALBUTEROL SULFATE HFA 108 (90 BASE) MCG/ACT IN AERS
6.0000 | INHALATION_SPRAY | Freq: Once | RESPIRATORY_TRACT | Status: AC
  Administered 2021-06-23: 6 via RESPIRATORY_TRACT

## 2021-06-23 MED ORDER — IPRATROPIUM BROMIDE HFA 17 MCG/ACT IN AERS
2.0000 | INHALATION_SPRAY | Freq: Once | RESPIRATORY_TRACT | Status: AC
  Administered 2021-06-23: 2 via RESPIRATORY_TRACT
  Filled 2021-06-23: qty 12.9

## 2021-06-23 MED ORDER — ALBUTEROL SULFATE (2.5 MG/3ML) 0.083% IN NEBU: 2.5000 mg | INHALATION_SOLUTION | RESPIRATORY_TRACT | Status: DC | PRN

## 2021-06-23 MED ORDER — METHYLPREDNISOLONE SODIUM SUCC 125 MG IJ SOLR
125.0000 mg | Freq: Once | INTRAMUSCULAR | Status: AC
  Administered 2021-06-23: 125 mg via INTRAVENOUS
  Filled 2021-06-23: qty 2

## 2021-06-23 MED ORDER — PREDNISONE 20 MG PO TABS: 60.0000 mg | ORAL_TABLET | Freq: Every day | ORAL | 0 refills | Status: AC

## 2021-06-23 MED ORDER — ALBUTEROL SULFATE HFA 108 (90 BASE) MCG/ACT IN AERS
2.0000 | INHALATION_SPRAY | Freq: Once | RESPIRATORY_TRACT | Status: DC
  Filled 2021-06-23: qty 6.7

## 2021-06-23 MED ORDER — ALBUTEROL SULFATE (2.5 MG/3ML) 0.083% IN NEBU
INHALATION_SOLUTION | RESPIRATORY_TRACT | Status: AC
  Filled 2021-06-23: qty 6

## 2021-06-23 MED ORDER — SODIUM CHLORIDE 0.9 % IV SOLN: 10.0000 mL/h | Freq: Once | INTRAVENOUS | Status: DC

## 2021-06-23 NOTE — Progress Notes (Signed)
Orthopedic Tech Progress Note Patient Details:  Ernest Reese 09/20/1971 [id number]  Ortho Devices Type of Ortho Device: Thumb velcro splint Ortho Device/Splint Location: lue Ortho Device/Splint Interventions: Ordered, Application, Adjustment   Post Interventions Patient Tolerated: Well Instructions Provided: Care of device, Adjustment of device  Karolee Stamps 06/23/2021, 8:43 PM

## 2021-06-23 NOTE — Discharge Instructions (Addendum)
Continue albuterol inhaler with 4 puffs of albuterol every 4 hours as needed, recommend 2 puffs of Atrovent every 6 hours as needed.  Take steroid as prescribed with the next dose tomorrow morning.  Follow-up with neurology about your hand weakness as discussed.  Use the splint for support.

## 2021-06-23 NOTE — ED Provider Notes (Addendum)
Emergency Medicine Provider Triage Evaluation Note  Ernest Reese , a 50 y.o. male  was evaluated in triage.  Pt complains of sob and syncope. He went to the store, went to his car and thinks he had a syncopal episode. He woke up and was not able to move his left wrist. No other neuro sxs. Main complaint is sob. Not on o2 at home. No chest pain. Recent shoulder surgery.  Review of Systems  Positive: Sob, syncope, ble edema (chronic) Negative: Chest pain  Physical Exam  BP (!) 130/91 (BP Location: Right Arm)   Pulse (!) 107   Temp 98.3 F (36.8 C)   Resp 19   SpO2 (!) 81%  Gen:   Awake, no distress   Resp:  Normal effort  MSK:   Moves extremities without difficulty  Other:  Diffuse wheezing throughout, ble edema. Decreased strength along the radial nerve distribution, no other neuro changes.  Medical Decision Making  Medically screening exam initiated at 5:18 PM.  Appropriate orders placed.  Kj Imbert Neukam was informed that the remainder of the evaluation will be completed by another provider, this initial triage assessment does not replace that evaluation, and the importance of remaining in the ED until their evaluation is complete.  Pt to be prioritized for room due to hypoxia. Doubt stroke. Lue weakness more consistent with sat night palsy therefore code stroke not initiated   Rodney Booze, PA-C 06/23/21 1721    Lineth Thielke S, PA-C 06/23/21 [place], [place], DO 06/26/21 1110

## 2021-06-23 NOTE — ED Triage Notes (Signed)
Pt states he was at the store this afternoon, unsure if he passed out but woke up in his car with shortness of breath and unable to move his left arm or use his left hand. Pt now able to move his arm, difficulty with grip, states it feels like his hand is asleep. 84% room air, oxygen applied.
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
## 2021-08-28 ENCOUNTER — Other Ambulatory Visit: Payer: Self-pay

## 2021-08-28 ENCOUNTER — Encounter: Payer: Medicaid Other | Attending: Physician Assistant | Admitting: Physician Assistant

## 2021-08-28 DIAGNOSIS — G629 Polyneuropathy, unspecified: Secondary | ICD-10-CM | POA: Diagnosis not present

## 2021-08-28 DIAGNOSIS — M1990 Unspecified osteoarthritis, unspecified site: Secondary | ICD-10-CM | POA: Insufficient documentation

## 2021-08-28 DIAGNOSIS — L98492 Non-pressure chronic ulcer of skin of other sites with fat layer exposed: Secondary | ICD-10-CM | POA: Diagnosis present

## 2021-08-28 NOTE — Progress Notes (Signed)
KADAR, CHANCE ([id number]) Visit Report for 08/28/2021 Allergy List Details Patient Name: Ernest Reese, Ernest Reese Date of Service: 08/28/2021 9:45 AM Medical Record Number: [id number] Patient Account Number: [id number] Date of Birth/Sex: [birth year] (50 y.o. M) Treating RN: Donnamarie Poag Primary Care Dushawn Pusey: Marzetta Board Other Clinician: Referring Ginamarie Banfield: Marzetta Board Treating Sherlock Nancarrow/Extender: Jeri Cos Weeks in Treatment: 0 Allergies Active Allergies No Known Allergies Allergy Notes Electronic Signature(s) Signed: 08/28/2021 2:32:46 PM By: Donnamarie Poag Entered By: Donnamarie Poag on 08/28/2021 10:44:42 Ernest Reese ([id number]) -------------------------------------------------------------------------------- Arrival Information Details Patient Name: Ernest Reese Date of Service: 08/28/2021 9:45 AM Medical Record Number: [id number] Patient Account Number: [id number] Date of Birth/Sex: [birth year] (50 y.o. M) Treating RN: Donnamarie Poag Primary Care Daena Alper: Marzetta Board Other Clinician: Referring Aniella Wandrey: Marzetta Board Treating Blima Jaimes/Extender: Skipper Cliche in Treatment: 0 Visit Information Patient Arrived: Ambulatory Arrival Time: 10:30 Accompanied By: self Transfer Assistance: None Patient Identification Verified: Yes Secondary Verification Process Completed: Yes Patient Has Alerts: Yes Patient Alerts: NOT diabetic Electronic Signature(s) Signed: 08/28/2021 2:32:46 PM By: Donnamarie Poag Entered By: Donnamarie Poag on 08/28/2021 10:36:05 Ernest Reese ([id number]) -------------------------------------------------------------------------------- Clinic Level of Care Assessment Details Patient Name: Ernest Reese Date of Service: 08/28/2021 9:45 AM Medical Record Number: [id number] Patient Account Number: [id number] Date of Birth/Sex: 11-25-1971 (50 y.o. M) Treating RN: Donnamarie Poag Primary Care Haru Anspaugh: Marzetta Board Other Clinician: Referring Armella Stogner: Marzetta Board Treating Nashaly Dorantes/Extender: Skipper Cliche in Treatment: 0 Clinic Level of Care Assessment Items TOOL 2 Quantity Score []  - Use when only an EandM is performed on the INITIAL visit 0 ASSESSMENTS - Nursing Assessment / Reassessment []  - General Physical Exam (combine w/ comprehensive assessment (listed just below) when performed on new 0 pt. evals) []  - 0 Comprehensive Assessment (HX, ROS, Risk Assessments, Wounds Hx, etc.) ASSESSMENTS - Wound and Skin Assessment / Reassessment X - Simple Wound Assessment / Reassessment - one wound 1 5 []  - 0 Complex Wound Assessment / Reassessment - multiple wounds []  - 0 Dermatologic / Skin Assessment (not related to wound area) ASSESSMENTS - Ostomy and/or Continence Assessment and Care []  - Incontinence Assessment and Management 0 []  - 0 Ostomy Care Assessment and Management (repouching, etc.) PROCESS - Coordination of Care X - Simple Patient / Family Education for ongoing care 1 15 []  - 0 Complex (extensive) Patient / Family Education for ongoing care X- 1 10 Staff obtains Programmer, systems, Records, Test Results / Process Orders []  - 0 Staff telephones HHA, Nursing Homes / Clarify orders / etc []  - 0 Routine Transfer to another Facility (non-emergent condition) []  - 0 Routine Hospital Admission (non-emergent condition) X- 1 15 New Admissions / Biomedical engineer / Ordering NPWT, Apligraf, etc. []  - 0 Emergency Hospital Admission (emergent condition) X- 1 10 Simple Discharge Coordination []  - 0 Complex (extensive) Discharge Coordination PROCESS - Special Needs []  - Pediatric / Minor Patient Management 0 []  - 0 Isolation Patient Management []  - 0 Hearing / Language / Visual special needs []  - 0 Assessment of Community assistance (transportation, D/C planning, etc.) []  - 0 Additional assistance / Altered mentation []  - 0 Support Surface(s) Assessment (bed, cushion, seat, etc.) INTERVENTIONS - Wound Cleansing /  Measurement X - Wound Imaging (photographs - any number of wounds) 1 5 []  - 0 Wound Tracing (instead of photographs) X- 1 5 Simple Wound Measurement - one wound []  - 0 Complex Wound Measurement - multiple wounds Ernest Reese, Ernest Reese. ([id number]) X- 1 5 Simple Wound Cleansing - one wound []  -  0 Complex Wound Cleansing - multiple wounds INTERVENTIONS - Wound Dressings X - Small Wound Dressing one or multiple wounds 1 10 []  - 0 Medium Wound Dressing one or multiple wounds []  - 0 Large Wound Dressing one or multiple wounds []  - 0 Application of Medications - injection INTERVENTIONS - Miscellaneous []  - External ear exam 0 []  - 0 Specimen Collection (cultures, biopsies, blood, body fluids, etc.) []  - 0 Specimen(s) / Culture(s) sent or taken to Lab for analysis []  - 0 Patient Transfer (multiple staff / Harrel Lemon Lift / Similar devices) []  - 0 Simple Staple / Suture removal (25 or less) []  - 0 Complex Staple / Suture removal (26 or more) []  - 0 Hypo / Hyperglycemic Management (close monitor of Blood Glucose) []  - 0 Ankle / Brachial Index (ABI) - do not check if billed separately Has the patient been seen at the hospital within the last three years: Yes Total Score: 80 Level Of Care: New/Established - Level 3 Electronic Signature(s) Signed: 08/28/2021 2:32:46 PM By: Donnamarie Poag Entered By: Donnamarie Poag on 08/28/2021 11:41:42 Ernest Reese ([id number]) -------------------------------------------------------------------------------- Encounter Discharge Information Details Patient Name: Ernest Reese Date of Service: 08/28/2021 9:45 AM Medical Record Number: [id number] Patient Account Number: [id number] Date of Birth/Sex: 10-Jul-1971 (50 y.o. M) Treating RN: Donnamarie Poag Primary Care Kyle Stansell: Marzetta Board Other Clinician: Referring Eriel Dunckel: Marzetta Board Treating Rea Kalama/Extender: Skipper Cliche in Treatment: 0 Encounter Discharge Information Items Post Procedure  Vitals Discharge Condition: Stable Temperature (F): 98.4 Ambulatory Status: Ambulatory Pulse (bpm): 84 Discharge Destination: Home Respiratory Rate (breaths/min): 16 Transportation: Private Auto Blood Pressure (mmHg): 129/72 Accompanied By: self Schedule Follow-up Appointment: Yes Clinical Summary of Care: Electronic Signature(s) Signed: 08/28/2021 2:32:46 PM By: Donnamarie Poag Entered By: Donnamarie Poag on 08/28/2021 11:43:00 Ernest Reese ([id number]) -------------------------------------------------------------------------------- Lower Extremity Assessment Details Patient Name: Ernest Reese Date of Service: 08/28/2021 9:45 AM Medical Record Number: [id number] Patient Account Number: [id number] Date of Birth/Sex: 07-28-1971 (50 y.o. M) Treating RN: Donnamarie Poag Primary Care Demitrus Francisco: Marzetta Board Other Clinician: Referring Deloss Amico: Marzetta Board Treating Luiz Trumpower/Extender: Jeri Cos Weeks in Treatment: 0 Electronic Signature(s) Signed: 08/28/2021 2:32:46 PM By: Donnamarie Poag Entered By: Donnamarie Poag on 08/28/2021 10:44:19 Ernest Reese ([id number]) -------------------------------------------------------------------------------- Multi Wound Chart Details Patient Name: Ernest Reese Date of Service: 08/28/2021 9:45 AM Medical Record Number: [id number] Patient Account Number: [id number] Date of Birth/Sex: 06/15/1971 (50 y.o. M) Treating RN: Donnamarie Poag Primary Care Shaterra Sanzone: Marzetta Board Other Clinician: Referring Sianni Cloninger: Marzetta Board Treating Cassadee Vanzandt/Extender: Skipper Cliche in Treatment: 0 Vital Signs Height(in): 72 Pulse(bpm): 26 Weight(lbs): 166 Blood Pressure(mmHg): 129/72 Body Mass Index(BMI): 23 Temperature(F): 98.4 Respiratory Rate(breaths/min): 16 Photos: [N/A:N/A] Wound Location: Right, Anterior Shoulder N/A N/A Wounding Event: Surgical Injury N/A N/A Primary Etiology: Open Surgical Wound N/A N/A Comorbid History: Chronic Obstructive Pulmonary  N/A N/A Disease (COPD), Hepatitis C, Osteoarthritis, Neuropathy, Received Chemotherapy Date Acquired: 01/01/2021 N/A N/A Weeks of Treatment: 0 N/A N/A Wound Status: Open N/A N/A Measurements L x W x D (cm) 0.2x0.2x0.4 N/A N/A Area (cm) : 0.031 N/A N/A Volume (cm) : 0.013 N/A N/A % Reduction in Area: 0.00% N/A N/A % Reduction in Volume: 0.00% N/A N/A Position 1 (o'clock): 1 Maximum Distance 1 (cm): 4.8 Starting Position 1 (o'clock): 12 Ending Position 1 (o'clock): 12 Maximum Distance 1 (cm): 1 Tunneling: Yes N/A N/A Undermining: Yes N/A N/A Classification: Full Thickness Without Exposed N/A N/A Support Structures Exudate Amount: Large N/A N/A Exudate Type: Serosanguineous N/A N/A Exudate Color: red,  brown N/A N/A Granulation Amount: Large (67-100%) N/A N/A Granulation Quality: Red, Pink N/A N/A Necrotic Amount: Small (1-33%) N/A N/A Exposed Structures: Fat Layer (Subcutaneous Tissue): N/A N/A Yes Fascia: No Tendon: No Muscle: No Joint: No Bone: No Treatment Notes Ernest Reese, Ernest Reese ([id number]) Electronic Signature(s) Signed: 08/28/2021 2:32:46 PM By: Donnamarie Poag Entered By: Donnamarie Poag on 08/28/2021 11:15:16 Ernest Reese ([id number]) -------------------------------------------------------------------------------- Pain Assessment Details Patient Name: Ernest Reese Date of Service: 08/28/2021 9:45 AM Medical Record Number: [id number] Patient Account Number: [id number] Date of Birth/Sex: Jan 08, 1971 (50 y.o. M) Treating RN: Donnamarie Poag Primary Care Landen Breeland: Marzetta Board Other Clinician: Referring Rachelann Enloe: Marzetta Board Treating Anaily Ashbaugh/Extender: Skipper Cliche in Treatment: 0 Active Problems Location of Pain Severity and Description of Pain Patient Has Paino No Site Locations Rate the pain. Current Pain Level: 0 Pain Management and Medication Current Pain Management: Electronic Signature(s) Signed: 08/28/2021 2:32:46 PM By: Donnamarie Poag Entered By:  Donnamarie Poag on 08/28/2021 10:36:30 Ernest Reese ([id number]) -------------------------------------------------------------------------------- Patient/Caregiver Education Details Patient Name: Ernest Reese Date of Service: 08/28/2021 9:45 AM Medical Record Number: [id number] Patient Account Number: [id number] Date of Birth/Gender: October 01, 1971 (50 y.o. M) Treating RN: Donnamarie Poag Primary Care Physician: Marzetta Board Other Clinician: Referring Physician: Marzetta Board Treating Physician/Extender: Skipper Cliche in Treatment: 0 Education Assessment Education Provided To: Patient Education Topics Provided Basic Hygiene: Nutrition: Smoking and Wound Healing: Wound/Skin Impairment: Electronic Signature(s) Signed: 08/28/2021 2:32:46 PM By: Donnamarie Poag Entered By: Donnamarie Poag on 08/28/2021 11:42:09 Ernest Reese ([id number]) -------------------------------------------------------------------------------- Wound Assessment Details Patient Name: Ernest Reese Date of Service: 08/28/2021 9:45 AM Medical Record Number: [id number] Patient Account Number: [id number] Date of Birth/Sex: 1971/01/08 (50 y.o. M) Treating RN: Donnamarie Poag Primary Care Dorine Duffey: Marzetta Board Other Clinician: Referring Rosalio Catterton: Marzetta Board Treating Meloni Hinz/Extender: Skipper Cliche in Treatment: 0 Wound Status Wound Number: 1 Primary Open Surgical Wound Etiology: Wound Location: Right, Anterior Shoulder Wound Open Wounding Event: Surgical Injury Status: Date Acquired: 01/01/2021 Comorbid Chronic Obstructive Pulmonary Disease (COPD), Hepatitis Weeks Of Treatment: 0 History: C, Osteoarthritis, Neuropathy, Received Chemotherapy Clustered Wound: No Photos Wound Measurements Length: (cm) 0.2 Width: (cm) 0.2 Depth: (cm) 0.4 Area: (cm) 0.031 Volume: (cm) 0.013 % Reduction in Area: 0% % Reduction in Volume: 0% Tunneling: Yes Position (o'clock): 1 Maximum Distance: (cm) 4.8 Undermining:  Yes Starting Position (o'clock): 12 Ending Position (o'clock): 12 Maximum Distance: (cm) 1 Wound Description Classification: Full Thickness With Exposed Support Structures Exudate Amount: Large Exudate Type: Serosanguineous Exudate Color: red, brown Foul Odor After Cleansing: No Slough/Fibrino Yes Wound Bed Granulation Amount: Large (67-100%) Exposed Structure Granulation Quality: Red, Pink Fascia Exposed: No Necrotic Amount: Small (1-33%) Fat Layer (Subcutaneous Tissue) Exposed: Yes Necrotic Quality: Adherent Slough Tendon Exposed: No Muscle Exposed: Yes Necrosis of Muscle: No Joint Exposed: No Bone Exposed: Yes Treatment Notes Wound #1 (Shoulder) Wound Laterality: Right, Anterior Ernest Reese, Ernest Reese ([id number]) Cleanser [place] Ancillary Kit - 15 Day Supply Discharge Instruction: Use supplies as instructed; Kit contains: (15) Saline Bullets; (15) 3x3 Gauze; 15 pr Gloves Peri-Wound Care Topical Primary Dressing Hydrofera Blue Classic Foam Rope Dressing, 9x6 (mm/in) Discharge Instruction: cut in 1/4 strip-place in DRY Secondary Dressing Zetuvit Plus Silicone Border Dressing 4x4 (in/in) Discharge Instruction: COVER DRESSING WITH DRY BANDAGE Secured With Compression Wrap Compression Stockings Add-Ons Electronic Signature(s) Signed: 08/28/2021 2:32:46 PM By: Donnamarie Poag Entered By: Donnamarie Poag on 08/28/2021 11:21:52 Ernest Reese ([id number]) -------------------------------------------------------------------------------- [place] Details Patient Name: Ernest Reese Date of Service: 08/28/2021 9:45 AM Medical Record  Number: [id number] Patient Account Number: [id number] Date of Birth/Sex: 1971-05-18 (50 y.o. M) Treating RN: Donnamarie Poag Primary Care Tacie Mccuistion: Marzetta Board Other Clinician: Referring Bretta Fees: Marzetta Board Treating Sherian Valenza/Extender: Skipper Cliche in Treatment: 0 Vital Signs Time Taken: 10:35 Temperature (F): 98.4 Height (in): 72 Pulse (bpm):  84 Source: Stated Respiratory Rate (breaths/min): 16 Weight (lbs): 166 Blood Pressure (mmHg): 129/72 Source: Measured Reference Range: 80 - 120 mg / dl Body Mass Index (BMI): 22.5 Electronic Signature(s) Signed: 08/28/2021 2:32:46 PM By: Donnamarie Poag Entered ByDonnamarie Poag on 08/28/2021 10:36:55

## 2021-08-28 NOTE — Progress Notes (Signed)
TARRIS, BHAT ([id number]) Visit Report for 08/28/2021 Abuse/Suicide Risk Screen Details Patient Name: Ernest Reese, Ernest Reese Date of Service: 08/28/2021 9:45 AM Medical Record Number: [id number] Patient Account Number: [id number] Date of Birth/Sex: 12-13-1971 (50 y.o. M) Treating RN: Donnamarie Poag Primary Care Channel Papandrea: Marzetta Board Other Clinician: Referring Purcell Jungbluth: Marzetta Board Treating Lenord Fralix/Extender: Skipper Cliche in Treatment: 0 Abuse/Suicide Risk Screen Items Answer ABUSE RISK SCREEN: Has anyone close to you tried to hurt or harm you recentlyo No Do you feel uncomfortable with anyone in your familyo No Has anyone forced you do things that you didnot want to doo No Electronic Signature(s) Signed: 08/28/2021 2:32:46 PM By: Donnamarie Poag Entered By: Donnamarie Poag on 08/28/2021 10:46:50 Ernest Reese ([id number]) -------------------------------------------------------------------------------- Activities of Daily Living Details Patient Name: Ernest Reese Date of Service: 08/28/2021 9:45 AM Medical Record Number: [id number] Patient Account Number: [id number] Date of Birth/Sex: 1971/09/25 (50 y.o. M) Treating RN: Donnamarie Poag Primary Care Liviana Mills: Marzetta Board Other Clinician: Referring Ankit Degregorio: Marzetta Board Treating Raksha Wolfgang/Extender: Skipper Cliche in Treatment: 0 Activities of Daily Living Items Answer Activities of Daily Living (Please select one for each item) Drive Automobile Completely Able Take Medications Completely Able Use Telephone Completely Able Care for Appearance Completely Able Use Toilet Completely Able Bath / Shower Completely Able Dress Self Completely Able Feed Self Completely Able Walk Completely Able Get In / Out Bed Completely Able Housework Completely Able Prepare Meals Completely Lillian for Self Completely Able Electronic Signature(s) Signed: 08/28/2021 2:32:46 PM By: Donnamarie Poag Entered By: Donnamarie Poag  on 08/28/2021 10:45:08 Ernest Reese ([id number]) -------------------------------------------------------------------------------- Education Screening Details Patient Name: Ernest Reese Date of Service: 08/28/2021 9:45 AM Medical Record Number: [id number] Patient Account Number: [id number] Date of Birth/Sex: [birth year] (50 y.o. M) Treating RN: Donnamarie Poag Primary Care Aniah Pauli: Marzetta Board Other Clinician: Referring Mataio Mele: Marzetta Board Treating Corney Knighton/Extender: Skipper Cliche in Treatment: 0 Primary Learner Assessed: Patient Learning Preferences/Education Level/Primary Language Learning Preference: Explanation Highest Education Level: Grade School Preferred Language: English Cognitive Barrier Language Barrier: No Translator Needed: No Memory Deficit: No Emotional Barrier: No Cultural/Religious Beliefs Affecting Medical Care: No Physical Barrier Impaired Vision: No Impaired Hearing: No Decreased Hand dexterity: No Knowledge/Comprehension Knowledge Level: High Comprehension Level: High Ability to understand written instructions: High Ability to understand verbal instructions: High Motivation Anxiety Level: Calm Cooperation: Cooperative Education Importance: Acknowledges Need Interest in Health Problems: Asks Questions Perception: Coherent Willingness to Engage in Self-Management High Activities: Readiness to Engage in Self-Management High Activities: Notes states 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 M) Treating RN: Donnamarie Poag Primary Care Shaylah Mcghie: Marzetta Board Other Clinician: Referring Gregg Holster: Marzetta Board Treating Tonya Wantz/Extender: Skipper Cliche in Treatment: 0 Fall Risk Assessment Items Have you had 2 or more falls in the last 12 monthso 0 No Have you had any fall that resulted in injury in the last 12 monthso 0 No FALLS RISK SCREEN History of falling - immediate or within 3 months 0 No Secondary diagnosis (Do you have 2 or more medical diagnoseso) 0 No Ambulatory aid None/bed rest/wheelchair/nurse 0 Yes Crutches/cane/walker 0 No Furniture 0 No Intravenous therapy Access/Saline/Heparin Lock 0 No Gait/Transferring Normal/ bed rest/ wheelchair 0 Yes Weak (  short steps with or without shuffle, stooped but able to lift head while walking, may 0 No seek support from furniture) Impaired (short steps with shuffle, may have difficulty arising from chair, head down, impaired 0 No balance) Mental Status Oriented to own ability 0 Yes Electronic Signature(s) Signed: 08/28/2021 2:32:46 PM By: Donnamarie Poag Entered By: Donnamarie Poag on 08/28/2021 10:46:14 Ernest Reese ([id number]) -------------------------------------------------------------------------------- Foot Assessment Details Patient Name: Ernest Reese Date of Service: 08/28/2021 9:45 AM Medical Record Number: [id number] Patient Account Number: [id number] Date of Birth/Sex: 20-Jan-1971 (50 y.o. M) Treating RN: Donnamarie Poag Primary Care Kevonta Phariss: Marzetta Board Other Clinician: Referring Sorrel Cassetta: Marzetta Board Treating Ercel Pepitone/Extender: Skipper Cliche in Treatment: 0 Foot Assessment Items Site Locations + = Sensation present, - = Sensation absent, C = Callus, U = Ulcer R = Redness, W = Warmth, M = Maceration, PU = Pre-ulcerative lesion F = Fissure, S = Swelling, D = Dryness Assessment Right: Left: Other Deformity: No No Prior Foot Ulcer: No No Prior Amputation: No No Charcot Joint: No No Ambulatory Status: Ambulatory  Without Help Gait: Steady Electronic Signature(s) Signed: 08/28/2021 2:32:46 PM By: Donnamarie Poag Entered By: Donnamarie Poag on 08/28/2021 10:46:42 Ernest Reese ([id number]) -------------------------------------------------------------------------------- Nutrition Risk Screening Details Patient Name: Ernest Reese Date of Service: 08/28/2021 9:45 AM Medical Record Number: [id number] Patient Account Number: [id number] Date of Birth/Sex: 05-12-1971 (50 y.o. M) Treating RN: Donnamarie Poag Primary Care Marveline Profeta: Marzetta Board Other Clinician: Referring Consuella Scurlock: Marzetta Board Treating Para Cossey/Extender: Skipper Cliche in Treatment: 0 Height (in): 72 Weight (lbs): 166 Body Mass Index (BMI): 22.5 Nutrition Risk Screening Items Score Screening NUTRITION RISK SCREEN: I have an illness or condition that made me change the kind and/or amount of food I eat 0 No I eat fewer than two meals per day 3 Yes I eat few fruits and vegetables, or milk products 2 Yes I have three or more drinks of beer, liquor or wine almost every day 0 No I have tooth or mouth problems that make it hard for me to eat 0 No I don't always have enough money to buy the food I need 0 No I eat alone most of the time 1 Yes I take three or more different prescribed or over-the-counter drugs a day 1 Yes Without wanting to, I have lost or gained 10 pounds in the last six months 0 No I am not always physically able to shop, cook and/or feed myself 0 No Nutrition Protocols Good Risk Protocol Moderate Risk Protocol 0 Provide education on nutrition High Risk Proctocol Risk Level: High Risk Score: 7 Electronic Signature(s) Signed: 08/28/2021 2:32:46 PM By: Donnamarie Poag Entered ByDonnamarie Poag on 08/28/2021 10:46:34

## 2021-08-29 ENCOUNTER — Other Ambulatory Visit: Payer: Self-pay | Admitting: Physician Assistant

## 2021-08-29 DIAGNOSIS — L98492 Non-pressure chronic ulcer of skin of other sites with fat layer exposed: Secondary | ICD-10-CM

## 2021-08-29 DIAGNOSIS — T8131XA Disruption of external operation (surgical) wound, not elsewhere classified, initial encounter: Secondary | ICD-10-CM

## 2021-08-29 NOTE — Progress Notes (Signed)
SAILOR, HAUGHN ([id number]) Visit Report for 08/28/2021 Chief Complaint Document Details Patient Name: Ernest Reese, Ernest Reese Date of Service: 08/28/2021 9:45 AM Medical Record Number: [id number] Patient Account Number: [id number] Date of Birth/Sex: Jan 20, 1971 (50 y.o. M) Treating RN: Donnamarie Poag Primary Care Provider: Marzetta Board Other Clinician: Referring Provider: Marzetta Board Treating Provider/Extender: Skipper Cliche in Treatment: 0 Information Obtained from: Patient Chief Complaint Right shoulder ulcer Electronic Signature(s) Signed: 08/28/2021 11:05:13 AM By: Worthy Keeler PA-C Entered By: Worthy Keeler on 08/28/2021 11:05:13 Ernest Reese ([id number]) -------------------------------------------------------------------------------- Debridement Details Patient Name: Ernest Reese Date of Service: 08/28/2021 9:45 AM Medical Record Number: [id number] Patient Account Number: [id number] Date of Birth/Sex: 1971-07-09 (49 y.o. M) Treating RN: Donnamarie Poag Primary Care Provider: Marzetta Board Other Clinician: Referring Provider: Marzetta Board Treating Provider/Extender: Skipper Cliche in Treatment: 0 Debridement Performed for Wound #1 Right,Anterior Shoulder Assessment: Performed By: Physician Tommie Sams., PA-C Debridement Type: Chemical/Enzymatic/Mechanical Agent Used: saline and gauze Level of Consciousness (Pre- Awake and Alert procedure): Pre-procedure Verification/Time Out Yes - 11:13 Taken: Start Time: 11:13 Pain Control: Lidocaine Instrument: Other : saline/gauze/qtip Bleeding: Moderate Hemostasis Achieved: Pressure Response to Treatment: Procedure was tolerated well Level of Consciousness (Post- Awake and Alert procedure): Post Debridement Measurements of Total Wound Length: (cm) 0.2 Width: (cm) 0.2 Depth: (cm) 0.4 Volume: (cm) 0.013 Character of Wound/Ulcer Post Debridement: Improved Post Procedure Diagnosis Same as  Pre-procedure Electronic Signature(s) Signed: 08/28/2021 2:32:46 PM By: Donnamarie Poag Signed: 08/28/2021 5:15:45 PM By: Worthy Keeler PA-C Entered By: Donnamarie Poag on 08/28/2021 11:17:42 Ernest Reese ([id number]) -------------------------------------------------------------------------------- HPI Details Patient Name: Ernest Reese Date of Service: 08/28/2021 9:45 AM Medical Record Number: [id number] Patient Account Number: [id number] Date of Birth/Sex: February 28, 1971 (49 y.o. M) Treating RN: Donnamarie Poag Primary Care Provider: Marzetta Board Other Clinician: Referring Provider: Marzetta Board Treating Provider/Extender: Skipper Cliche in Treatment: 0 History of Present Illness HPI Description: 08/28/2021 upon evaluation today patient presents for initial evaluation here in clinic concerning issues that he has been having with his right shoulder. Unfortunately he had a shoulder replacement February 11, 2021. I did try to review records as much as possible currently. With that being said he unfortunately did not do well with the surgery and apparently had some infection he has not been able to have a PICC line and IV antibiotics due to his history of drug abuse that is considered high risk at this point in this patient. Nonetheless he did have a repeat surgery on February 27, 2021 in April 18, 2021 to go in and clean out the area. Unfortunately despite this he has a small region that still is draining quite significantly. He has been on Cipro due to a culture which showed Staphylococcus but there does not appear to be anything more significant here which is good news. That was available for review today and that was showing light growth. With all that being said I really feel like that this patient may need a second opinion since he does not like the back to the current surgeon with a different surgeon to see what they recommend. To that end I am thinking of actually making referral to [facility]. The patient is on Cipro again currently he is also on methadone as well as having been given 5 mg oxycodone to help with breakthrough pain. He does have a history of polysubstance abuse as well as apparently schizophrenia and bipolar the I am uncertain of the  full history here to be perfectly honest. He also has COPD. Electronic Signature(s) Signed: 08/28/2021 5:11:24 PM By: Worthy Keeler PA-C Entered By: Worthy Keeler on 08/28/2021 17:11:23 Ernest Reese ([id number]) -------------------------------------------------------------------------------- Physical Exam Details Patient Name: Ernest Reese Date of Service: 08/28/2021 9:45 AM Medical Record Number: [id number] Patient Account Number: [id number] Date of Birth/Sex: May 26, 1971 (49 y.o. M) Treating RN: Donnamarie Poag Primary Care Provider: Marzetta Board Other Clinician: Referring Provider: Marzetta Board Treating Provider/Extender: Skipper Cliche in Treatment: 0 Constitutional sitting or standing blood pressure is within target range for patient.. pulse regular and within target range for patient.Marland Kitchen respirations regular, non- labored and within target range for patient.Marland Kitchen temperature within target range for patient.. Well-nourished and well-hydrated in no acute distress. Eyes conjunctiva clear no eyelid edema noted. pupils equal round and reactive to light and accommodation. Ears, Nose, Mouth, and Throat no gross abnormality of ear auricles or external auditory canals. normal hearing noted during conversation. mucus membranes moist. Respiratory normal breathing without difficulty. Cardiovascular 2+ dorsalis pedis/posterior tibialis pulses. no clubbing, cyanosis, significant edema, <3 sec cap refill. Musculoskeletal normal gait and posture. no significant deformity or arthritic changes, no loss or range of motion, no clubbing. Psychiatric this patient is able to make decisions and demonstrates good insight  into disease process. Alert and Oriented x 3. pleasant and cooperative. Notes Upon inspection patient's wound actually did have a small opening this really was not large at all but nonetheless did have some depth to it but more importantly a tunnel at 1:00 which went quite deep all the way back down into the bone. I could actually tap bone with the end of the sterile Q-tip that was utilized. Nonetheless there was also clear serous fluid that came out as a result with some tinge of yellow. He did have some discomfort as well. Unfortunately I do not think that this is good to be something that skin would just be a wound care treatment regimen I think he is going to probably need to see a surgeon and since he is not wanting to see the surgeon has been seen and would like a second opinion I Georgina Peer see about making referral to [facility] for this. He is in agreement with that plan. Electronic Signature(s) Signed: 08/28/2021 5:12:19 PM By: Worthy Keeler PA-C Entered By: Worthy Keeler on 08/28/2021 17:12:19 Ernest Reese ([id number]) -------------------------------------------------------------------------------- Physician Orders Details Patient Name: Ernest Reese Date of Service: 08/28/2021 9:45 AM Medical Record Number: [id number] Patient Account Number: [id number] Date of Birth/Sex: 10-28-1971 (49 y.o. M) Treating RN: Donnamarie Poag Primary Care Provider: Marzetta Board Other Clinician: Referring Provider: Marzetta Board Treating Provider/Extender: Skipper Cliche in Treatment: 0 Verbal / Phone Orders: No Diagnosis Coding ICD-10 Coding Code Description T81.31XA Disruption of external operation (surgical) wound, not elsewhere classified, initial encounter L98.492 Non-pressure chronic ulcer of skin of other sites with fat layer exposed F20.89 Other schizophrenia F31.89 Other bipolar disorder J44.9 Chronic obstructive pulmonary disease, unspecified Follow-up  Appointments o Return Appointment in 2 weeks. o Nurse Visit as needed - call if needed Bathing/ Shower/ Hygiene o Wash wounds with antibacterial soap and water. o May shower; gently cleanse wound with antibacterial soap, rinse and pat dry prior to dressing wounds - keep dressing dry/change after bathing Medications-Please add to medication list. o P.O. Antibiotics - finish your current antibiotics o Other: - TRY ALEVE bought over the counter for pain as prescribed Wound Treatment Wound #1 - Shoulder  Wound Laterality: Right, Anterior Cleanser: Byram Ancillary Kit - 15 Day Supply (DME) (Generic) Every Other Day/30 Days Discharge Instructions: Use supplies as instructed; Kit contains: (15) Saline Bullets; (15) 3x3 Gauze; 15 pr Gloves Primary Dressing: Hydrofera Blue Classic Foam Rope Dressing, 9x6 (mm/in) Every Other Day/30 Days Discharge Instructions: cut in 1/4 strip-place in DRY Secondary Dressing: Zetuvit Plus Silicone Border Dressing 4x4 (in/in) Every Other Day/30 Days Discharge Instructions: [facility] o MRI with and without Contrast - MRI OF RIGHT SHOULDER Electronic Signature(s) Signed: 08/28/2021 2:32:46 PM By: Donnamarie Poag Signed: 08/28/2021 5:15:45 PM By: Worthy Keeler PA-C Entered By: Donnamarie Poag on 08/28/2021 11:32:27 Ernest Reese ([id number]) -------------------------------------------------------------------------------- Problem List Details Patient Name: Ernest Reese Date of Service: 08/28/2021 9:45 AM Medical Record Number: [id number] Patient Account Number: [id number] Date of Birth/Sex: 1971-09-28 (49 y.o. M) Treating RN: Donnamarie Poag Primary Care Provider: Marzetta Board Other Clinician: Referring Provider: Marzetta Board Treating Provider/Extender: Skipper Cliche in Treatment: 0 Active Problems ICD-10 Encounter Code  Description Active Date MDM Diagnosis T81.31XA Disruption of external operation (surgical) wound, not elsewhere 08/28/2021 No Yes classified, initial encounter L98.492 Non-pressure chronic ulcer of skin of other sites with fat layer exposed 08/28/2021 No Yes F20.89 Other schizophrenia 08/28/2021 No Yes F31.89 Other bipolar disorder 08/28/2021 No Yes J44.9 Chronic obstructive pulmonary disease, unspecified 08/28/2021 No Yes Inactive Problems Resolved Problems Electronic Signature(s) Signed: 08/28/2021 11:04:34 AM By: Worthy Keeler PA-C Entered By: Worthy Keeler on 08/28/2021 11:04:34 Ernest Reese ([id number]) -------------------------------------------------------------------------------- Progress Note Details Patient Name: Ernest Reese Date of Service: 08/28/2021 9:45 AM Medical Record Number: [id number] Patient Account Number: [id number] Date of Birth/Sex: 1971-05-28 (49 y.o. M) Treating RN: Donnamarie Poag Primary Care Provider: Marzetta Board Other Clinician: Referring Provider: Marzetta Board Treating Provider/Extender: Skipper Cliche in Treatment: 0 Subjective Chief Complaint Information obtained from Patient Right shoulder ulcer History of Present Illness (HPI) 08/28/2021 upon evaluation today patient presents for initial evaluation here in clinic concerning issues that he has been having with his right shoulder. Unfortunately he had a shoulder replacement February 11, 2021. I did try to review records as much as possible currently. With that being said he unfortunately did not do well with the surgery and apparently had some infection he has not been able to have a PICC line and IV antibiotics due to his history of drug abuse that is considered high risk at this point in this patient. Nonetheless he did have a repeat surgery on February 27, 2021 in April 18, 2021 to go in and clean out the area. Unfortunately despite this he has a small region that still is draining  quite significantly. He has been on Cipro due to a culture which showed Staphylococcus but there does not appear to be anything more significant here which is good news. That was available for review today and that was showing light growth. With all that being said I really feel like that this patient may need a second opinion since he does not like the back to the current surgeon with a different surgeon to see what they recommend. To that end I am thinking of actually making referral to [facility]. The patient is on Cipro again currently he is also on methadone as well as having been given 5 mg oxycodone to help with breakthrough pain. He does have a history of polysubstance abuse  as well as apparently schizophrenia and bipolar the I am uncertain of the full history here to be perfectly honest. He also has COPD. Patient History Allergies No Known Allergies Social History Current every day smoker - started on 12/29/1983, Marital Status - Divorced, Alcohol Use - Never, Drug Use - Prior History - pain pills, Caffeine Use - Daily. Medical History Respiratory Patient has history of Chronic Obstructive Pulmonary Disease (COPD) Gastrointestinal Patient has history of Hepatitis C Musculoskeletal Patient has history of Osteoarthritis Neurologic Patient has history of Neuropathy Oncologic Patient has history of Received Chemotherapy - testicular cancer 2000 Review of Systems (ROS) Constitutional Symptoms ([place]) Denies complaints or symptoms of Fatigue, Fever, Chills, Marked Weight Change. Eyes Denies complaints or symptoms of Dry Eyes, Vision Changes, Glasses / Contacts. Ear/Nose/Mouth/Throat Denies complaints or symptoms of Difficult clearing ears, Sinusitis. Hematologic/Lymphatic Denies complaints or symptoms of Bleeding / Clotting Disorders, Human Immunodeficiency Virus. Respiratory Complains or has symptoms of Shortness of Breath. Cardiovascular Denies  complaints or symptoms of Chest pain, LE edema. Endocrine Complains or has symptoms of Hepatitis - c. Genitourinary Denies complaints or symptoms of Kidney failure/ Dialysis, Incontinence/dribbling. Immunological Denies complaints or symptoms of Hives, Itching. Integumentary (Skin) Denies complaints or symptoms of Wounds, Bleeding or bruising tendency, Breakdown, Swelling. Psychiatric Complains or has symptoms of Anxiety, does not follow with any psych service and takes no meds "I just deal with it" Ernest Reese, Ernest Reese. ([id number]) Objective Constitutional sitting or standing blood pressure is within target range for patient.. pulse regular and within target range for patient.Marland Kitchen respirations regular, non- labored and within target range for patient.Marland Kitchen temperature within target range for patient.. Well-nourished and well-hydrated in no acute distress. Vitals Time Taken: 10:35 AM, Height: 72 in, Source: Stated, Weight: 166 lbs, Source: Measured, BMI: 22.5, Temperature: 98.4 F, Pulse: 84 bpm, Respiratory Rate: 16 breaths/min, Blood Pressure: 129/72 mmHg. Eyes conjunctiva clear no eyelid edema noted. pupils equal round and reactive to light and accommodation. Ears, Nose, Mouth, and Throat no gross abnormality of ear auricles or external auditory canals. normal hearing noted during conversation. mucus membranes moist. Respiratory normal breathing without difficulty. Cardiovascular 2+ dorsalis pedis/posterior tibialis pulses. no clubbing, cyanosis, significant edema, Musculoskeletal normal gait and posture. no significant deformity or arthritic changes, no loss or range of motion, no clubbing. Psychiatric this patient is able to make decisions and demonstrates good insight into disease process. Alert and Oriented x 3. pleasant and cooperative. General Notes: Upon inspection patient's wound actually did have a small opening this really was not large at all but nonetheless did have some depth to it  but more importantly a tunnel at 1:00 which went quite deep all the way back down into the bone. I could actually tap bone with the end of the sterile Q-tip that was utilized. Nonetheless there was also clear serous fluid that came out as a result with some tinge of yellow. He did have some discomfort as well. Unfortunately I do not think that this is good to be something that skin would just be a wound care treatment regimen I think he is going to probably need to see a surgeon and since he is not wanting to see the surgeon has been seen and would like a second opinion I Georgina Peer see about making referral to [facility] for this. He is in agreement with that plan. Integumentary (Hair, Skin) Wound #1 status is Open. Original cause of wound was Surgical Injury. The date acquired was: 01/01/2021. The wound  is located on the Right,Anterior Shoulder. The wound measures 0.2cm length x 0.2cm width x 0.4cm depth; 0.031cm^2 area and 0.013cm^3 volume. There is bone, muscle, and Fat Layer (Subcutaneous Tissue) exposed. Tunneling has been noted at 1:00 with a maximum distance of 4.8cm. Undermining begins at 12:00 and ends at 12:00 with a maximum distance of 1cm. There is a large amount of serosanguineous drainage noted. There is large (67-100%) red, pink granulation within the wound bed. There is a small (1-33%) amount of necrotic tissue within the wound bed including Adherent Slough. Assessment Active Problems ICD-10 Disruption of external operation (surgical) wound, not elsewhere classified, initial encounter Non-pressure chronic ulcer of skin of other sites with fat layer exposed Other schizophrenia Other bipolar disorder Chronic obstructive pulmonary disease, unspecified Procedures Wound #1 Pre-procedure diagnosis of Wound #1 is an Open Surgical Wound located on the Right,Anterior Shoulder . There was a Chemical/Enzymatic/Mechanical debridement performed by Tommie Sams., PA-C.  With the following instrument(s): saline/gauze/qtip after Ernest Reese, Ernest Reese ([id number]) achieving pain control using Lidocaine. Other agent used was saline and gauze. A time out was conducted at 11:13, prior to the start of the procedure. A Moderate amount of bleeding was controlled with Pressure. The procedure was tolerated well. Post Debridement Measurements: 0.2cm length x 0.2cm width x 0.4cm depth; 0.013cm^3 volume. Character of Wound/Ulcer Post Debridement is improved. Post procedure Diagnosis Wound #1: Same as Pre-Procedure Plan Follow-up Appointments: Return Appointment in 2 weeks. Nurse Visit as needed - call if needed Bathing/ Shower/ Hygiene: Wash wounds with antibacterial soap and water. May shower; gently cleanse wound with antibacterial soap, rinse and pat dry prior to dressing wounds - keep dressing dry/change after bathing Medications-Please add to medication list.: P.O. Antibiotics - finish your current antibiotics Other: - TRY ALEVE bought over the counter for pain as prescribed Consults ordered were: [facility] ordered were: MRI with and without Contrast - MRI OF RIGHT SHOULDER WOUND #1: - Shoulder Wound Laterality: Right, Anterior Cleanser: Byram Ancillary Kit - 15 Day Supply (DME) (Generic) Every Other Day/30 Days Discharge Instructions: Use supplies as instructed; Kit contains: (15) Saline Bullets; (15) 3x3 Gauze; 15 pr Gloves Primary Dressing: Hydrofera Blue Classic Foam Rope Dressing, 9x6 (mm/in) Every Other Day/30 Days Discharge Instructions: cut in 1/4 strip-place in DRY Secondary Dressing: Zetuvit Plus Silicone Border Dressing 4x4 (in/in) Every Other Day/30 Days Discharge Instructions: COVER DRESSING WITH DRY BANDAGE 1. Amount of go ahead and put in the order to send the patient to [facility] for further evaluation and treatment and the patient is in  agreement with that plan. 2. I am also can recommend that we have the patient continue to pack this area as that will keep it from developing seroma underlying. I think that packing with Hydrofera Blue rope is probably can be the way to go. 3. With regard to the cover dressing I would recommend that Zetuvit border foam dressing which is good to help with catching the drainage which is quite prevalent based on what I see currently. 4. I am also can recommend that we have the patient go to the ER for further evaluation and treatment if anything worsens such as increased pain, redness, or any fevers, chills, nausea, vomiting, or diarrhea which could indicate a more systemic infection. We will see patient back for reevaluation in 1 week here in the clinic. If anything worsens or changes patient will contact our office for additional  recommendations. Electronic Signature(s) Signed: 08/28/2021 5:13:38 PM By: Worthy Keeler PA-C Entered By: Worthy Keeler on 08/28/2021 17:13:37 Ernest Reese ([id number]) -------------------------------------------------------------------------------- ROS/PFSH Details Patient Name: Ernest Reese Date of Service: 08/28/2021 9:45 AM Medical Record Number: [id number] Patient Account Number: [id number] Date of Birth/Sex: September 11, 1971 (49 y.o. M) Treating RN: Donnamarie Poag Primary Care Provider: Marzetta Board Other Clinician: Referring Provider: Marzetta Board Treating Provider/Extender: Skipper Cliche in Treatment: 0 Constitutional Symptoms (General Health) Complaints and Symptoms: Negative for: Fatigue; Fever; Chills; Marked Weight Change Eyes Complaints and Symptoms: Negative for: Dry Eyes; Vision Changes; Glasses / Contacts Ear/Nose/Mouth/Throat Complaints and Symptoms: Negative for: Difficult clearing ears; Sinusitis Hematologic/Lymphatic Complaints and Symptoms: Negative for: Bleeding / Clotting Disorders; Human Immunodeficiency  Virus Respiratory Complaints and Symptoms: Positive for: Shortness of Breath Medical History: Positive for: Chronic Obstructive Pulmonary Disease (COPD) Cardiovascular Complaints and Symptoms: Negative for: Chest pain; LE edema Endocrine Complaints and Symptoms: Positive for: Hepatitis - c Genitourinary Complaints and Symptoms: Negative for: Kidney failure/ Dialysis; Incontinence/dribbling Immunological Complaints and Symptoms: Negative for: Hives; Itching Integumentary (Skin) Complaints and Symptoms: Negative for: Wounds; Bleeding or bruising tendency; Breakdown; Swelling Psychiatric Complaints and Symptoms: Positive for: Anxiety Review of System Notes: does not follow with any psych service and takes no meds Ernest Reese, Ernest Reese ([id number]) "I just deal with it" Gastrointestinal Medical History: Positive for: Hepatitis C Musculoskeletal Medical History: Positive for: Osteoarthritis Neurologic Medical History: Positive for: Neuropathy Oncologic Medical History: Positive for: Received Chemotherapy - testicular cancer 2000 Immunizations Pneumococcal Vaccine: Received Pneumococcal Vaccination: No Implantable Devices None Family and Social History Current every day smoker - started on 12/29/1983; Marital Status - Divorced; Alcohol Use: Never; Drug Use: Prior History - pain pills; Caffeine Use: Daily; Financial Concerns: No; Food, Clothing or Shelter Needs: No; Support System Lacking: No; Transportation Concerns: No Electronic Signature(s) Signed: 08/28/2021 2:32:46 PM By: Donnamarie Poag Signed: 08/28/2021 5:15:45 PM By: Worthy Keeler PA-C Entered By: Donnamarie Poag on 08/28/2021 10:50:58 Ernest Reese ([id number]) -------------------------------------------------------------------------------- SuperBill Details Patient Name: Ernest Reese Date of Service: 08/28/2021 Medical Record Number: [id number] Patient Account Number: [id number] Date of Birth/Sex: 1971-09-10 (50 y.o.  M) Treating RN: Donnamarie Poag Primary Care Provider: Marzetta Board Other Clinician: Referring Provider: Marzetta Board Treating Provider/Extender: Skipper Cliche in Treatment: 0 Diagnosis Coding ICD-10 Codes Code Description T81.31XA Disruption of external operation (surgical) wound, not elsewhere classified, initial encounter L98.492 Non-pressure chronic ulcer of skin of other sites with fat layer exposed F20.89 Other schizophrenia F31.89 Other bipolar disorder J44.9 Chronic obstructive pulmonary disease, unspecified Facility Procedures CPT4 Code: [id number] Description: 99213 - WOUND CARE VISIT-LEV 3 EST PT Modifier: Quantity: 1 CPT4 Code: [id number] Description: 62035 - DEBRIDE W/O ANES NON SELECT Modifier: Quantity: 1 Physician Procedures CPT4 Code: [id number] Description: 84536 - WC PHYS LEVEL 4 - NEW PT Modifier: Quantity: 1 CPT4 Code: Description: ICD-10 Diagnosis Description T81.31XA Disruption of external operation (surgical) wound, not elsewhere classifi L98.492 Non-pressure chronic ulcer of skin of other sites with fat layer exposed F20.89 Other schizophrenia F31.89 Other bipolar  disorder Modifier: ed, initial encounter Quantity: Electronic Signature(s) Signed: 08/28/2021 5:15:06 PM By: Worthy Keeler PA-C Previous Signature: 08/28/2021 2:32:46 PM Version By: Donnamarie Poag Entered By: Worthy Keeler on 08/28/2021 17:15:05

## 2021-09-08 ENCOUNTER — Ambulatory Visit (HOSPITAL_COMMUNITY): Admission: RE | Admit: 2021-09-08 | Payer: Medicaid Other | Source: Ambulatory Visit

## 2021-09-08 ENCOUNTER — Encounter (HOSPITAL_COMMUNITY): Payer: Self-pay

## 2021-09-11 ENCOUNTER — Ambulatory Visit: Payer: Medicaid Other | Admitting: Physician Assistant

## 2021-09-24 ENCOUNTER — Other Ambulatory Visit (HOSPITAL_COMMUNITY): Payer: Self-pay

## 2021-09-24 ENCOUNTER — Encounter (HOSPITAL_COMMUNITY): Payer: Self-pay

## 2021-09-24 ENCOUNTER — Other Ambulatory Visit: Payer: Self-pay

## 2021-09-24 ENCOUNTER — Ambulatory Visit (HOSPITAL_COMMUNITY)
Admission: EM | Admit: 2021-09-24 | Discharge: 2021-09-24 | Disposition: A | Discharge status: Home / Self Care | Payer: Medicaid Other | Attending: Family Medicine | Admitting: Family Medicine

## 2021-09-24 DIAGNOSIS — S41001A Unspecified open wound of right shoulder, initial encounter: Secondary | ICD-10-CM

## 2021-09-24 MED ORDER — DOXYCYCLINE HYCLATE 100 MG PO CAPS
100.0000 mg | ORAL_CAPSULE | Freq: Two times a day (BID) | ORAL | 0 refills | Status: DC
  Filled 2021-09-24: qty 20, 10d supply, fill #0

## 2021-09-24 MED ORDER — IBUPROFEN 800 MG PO TABS
800.0000 mg | ORAL_TABLET | Freq: Three times a day (TID) | ORAL | 0 refills | Status: DC
  Filled 2021-09-24: qty 21, 7d supply, fill #0

## 2021-09-24 MED ORDER — OXYCODONE HCL 5 MG PO TABS
5.0000 mg | ORAL_TABLET | Freq: Four times a day (QID) | ORAL | 0 refills | Status: DC | PRN
  Filled 2021-09-24: qty 20, 3d supply, fill #0

## 2021-09-24 NOTE — Discharge Instructions (Addendum)
Be aware, you have been prescribed pain medications that may cause drowsiness. While taking this medication, do not take any other medications containing acetaminophen (Tylenol). Do not combine with alcohol or other illicit drugs. Please do not drive, operate heavy machinery, or take part in activities that require making important decisions while on this medication as your judgement may be clouded.  Keep your appointment in 2 weeks with [facility].

## 2021-09-24 NOTE — ED Triage Notes (Signed)
Pt states had a total rt shoulder replacement 8 months ago. States has a wound to that area x1-2wks with drainage and pain.   On arrival to room pt is wheezing and having SOB. States he just used his inhaler and this is his normal. Pts sats 82%RA initailly and as he resting his O2 increased to 90%RA. Pt is now speaking in complete sentences.
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
## 2021-10-07 ENCOUNTER — Ambulatory Visit: Payer: Medicaid Other | Admitting: Internal Medicine

## 2021-10-12 ENCOUNTER — Encounter (HOSPITAL_COMMUNITY): Payer: Self-pay

## 2021-10-12 ENCOUNTER — Emergency Department (HOSPITAL_COMMUNITY)
Admission: EM | Admit: 2021-10-12 | Discharge: 2021-10-12 | Disposition: A | Discharge status: Home / Self Care | Payer: Medicaid Other | Attending: Emergency Medicine | Admitting: Emergency Medicine

## 2021-10-12 DIAGNOSIS — Z96659 Presence of unspecified artificial knee joint: Secondary | ICD-10-CM | POA: Diagnosis not present

## 2021-10-12 DIAGNOSIS — J441 Chronic obstructive pulmonary disease with (acute) exacerbation: Secondary | ICD-10-CM | POA: Diagnosis not present

## 2021-10-12 DIAGNOSIS — Z96611 Presence of right artificial shoulder joint: Secondary | ICD-10-CM | POA: Diagnosis not present

## 2021-10-12 DIAGNOSIS — T402X1A Poisoning by other opioids, accidental (unintentional), initial encounter: Secondary | ICD-10-CM | POA: Insufficient documentation

## 2021-10-12 DIAGNOSIS — Z8547 Personal history of malignant neoplasm of testis: Secondary | ICD-10-CM | POA: Diagnosis not present

## 2021-10-12 DIAGNOSIS — F17210 Nicotine dependence, cigarettes, uncomplicated: Secondary | ICD-10-CM | POA: Insufficient documentation

## 2021-10-12 DIAGNOSIS — R464 Slowness and poor responsiveness: Secondary | ICD-10-CM | POA: Insufficient documentation

## 2021-10-12 DIAGNOSIS — T40601A Poisoning by unspecified narcotics, accidental (unintentional), initial encounter: Secondary | ICD-10-CM

## 2021-10-12 NOTE — ED Triage Notes (Signed)
Pt is sleepy, will answer questions now, said that he does not know what he took
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
## 2021-10-12 NOTE — ED Notes (Addendum)
Pt woke up and was adamant that he was going home. Dr Ralene Bathe came in and asked all of the orientation questions, and made it clear to him that leaving was a bad idea, and that he could go home stop breathing and die.  Patient repeated this concern and he said "I could stop breathing but I am going home, I know that I could die." Pt Iv was taken out he signed ama, and he left the er

## 2021-11-16 ENCOUNTER — Emergency Department (HOSPITAL_COMMUNITY): Payer: Medicaid Other

## 2021-11-16 ENCOUNTER — Inpatient Hospital Stay (HOSPITAL_COMMUNITY)
Admission: EM | Admit: 2021-11-16 | Discharge: 2021-11-25 | DRG: 871 | Disposition: A | Discharge status: Home Health | Payer: Medicaid Other | Attending: Family Medicine | Admitting: Family Medicine

## 2021-11-16 ENCOUNTER — Encounter (HOSPITAL_COMMUNITY): Payer: Self-pay | Admitting: Emergency Medicine

## 2021-11-16 ENCOUNTER — Other Ambulatory Visit (HOSPITAL_COMMUNITY): Payer: Medicaid Other

## 2021-11-16 DIAGNOSIS — N320 Bladder-neck obstruction: Secondary | ICD-10-CM | POA: Diagnosis present

## 2021-11-16 DIAGNOSIS — J432 Centrilobular emphysema: Secondary | ICD-10-CM | POA: Diagnosis present

## 2021-11-16 DIAGNOSIS — F1910 Other psychoactive substance abuse, uncomplicated: Secondary | ICD-10-CM | POA: Diagnosis present

## 2021-11-16 DIAGNOSIS — N189 Chronic kidney disease, unspecified: Secondary | ICD-10-CM

## 2021-11-16 DIAGNOSIS — Z8673 Personal history of transient ischemic attack (TIA), and cerebral infarction without residual deficits: Secondary | ICD-10-CM

## 2021-11-16 DIAGNOSIS — Z8619 Personal history of other infectious and parasitic diseases: Secondary | ICD-10-CM | POA: Diagnosis present

## 2021-11-16 DIAGNOSIS — E870 Hyperosmolality and hypernatremia: Secondary | ICD-10-CM | POA: Diagnosis not present

## 2021-11-16 DIAGNOSIS — F1410 Cocaine abuse, uncomplicated: Secondary | ICD-10-CM | POA: Diagnosis present

## 2021-11-16 DIAGNOSIS — F1123 Opioid dependence with withdrawal: Secondary | ICD-10-CM | POA: Diagnosis not present

## 2021-11-16 DIAGNOSIS — Z781 Physical restraint status: Secondary | ICD-10-CM

## 2021-11-16 DIAGNOSIS — J690 Pneumonitis due to inhalation of food and vomit: Secondary | ICD-10-CM | POA: Diagnosis present

## 2021-11-16 DIAGNOSIS — N400 Enlarged prostate without lower urinary tract symptoms: Secondary | ICD-10-CM | POA: Diagnosis not present

## 2021-11-16 DIAGNOSIS — R0600 Dyspnea, unspecified: Secondary | ICD-10-CM

## 2021-11-16 DIAGNOSIS — Z79899 Other long term (current) drug therapy: Secondary | ICD-10-CM

## 2021-11-16 DIAGNOSIS — J189 Pneumonia, unspecified organism: Secondary | ICD-10-CM | POA: Diagnosis present

## 2021-11-16 DIAGNOSIS — I700 Atherosclerosis of aorta: Secondary | ICD-10-CM | POA: Diagnosis present

## 2021-11-16 DIAGNOSIS — Z8547 Personal history of malignant neoplasm of testis: Secondary | ICD-10-CM

## 2021-11-16 DIAGNOSIS — R0902 Hypoxemia: Secondary | ICD-10-CM

## 2021-11-16 DIAGNOSIS — J449 Chronic obstructive pulmonary disease, unspecified: Secondary | ICD-10-CM | POA: Diagnosis present

## 2021-11-16 DIAGNOSIS — J9622 Acute and chronic respiratory failure with hypercapnia: Secondary | ICD-10-CM | POA: Diagnosis present

## 2021-11-16 DIAGNOSIS — K7200 Acute and subacute hepatic failure without coma: Secondary | ICD-10-CM | POA: Diagnosis present

## 2021-11-16 DIAGNOSIS — Z20822 Contact with and (suspected) exposure to covid-19: Secondary | ICD-10-CM | POA: Diagnosis present

## 2021-11-16 DIAGNOSIS — J9621 Acute and chronic respiratory failure with hypoxia: Secondary | ICD-10-CM | POA: Diagnosis present

## 2021-11-16 DIAGNOSIS — A419 Sepsis, unspecified organism: Principal | ICD-10-CM | POA: Diagnosis present

## 2021-11-16 DIAGNOSIS — F319 Bipolar disorder, unspecified: Secondary | ICD-10-CM | POA: Diagnosis present

## 2021-11-16 DIAGNOSIS — K5900 Constipation, unspecified: Secondary | ICD-10-CM | POA: Diagnosis not present

## 2021-11-16 DIAGNOSIS — F17210 Nicotine dependence, cigarettes, uncomplicated: Secondary | ICD-10-CM | POA: Diagnosis present

## 2021-11-16 DIAGNOSIS — N179 Acute kidney failure, unspecified: Secondary | ICD-10-CM

## 2021-11-16 DIAGNOSIS — J9602 Acute respiratory failure with hypercapnia: Secondary | ICD-10-CM | POA: Diagnosis present

## 2021-11-16 DIAGNOSIS — N170 Acute kidney failure with tubular necrosis: Secondary | ICD-10-CM | POA: Diagnosis present

## 2021-11-16 DIAGNOSIS — Z96611 Presence of right artificial shoulder joint: Secondary | ICD-10-CM | POA: Diagnosis present

## 2021-11-16 DIAGNOSIS — T40601A Poisoning by unspecified narcotics, accidental (unintentional), initial encounter: Secondary | ICD-10-CM | POA: Diagnosis present

## 2021-11-16 DIAGNOSIS — R739 Hyperglycemia, unspecified: Secondary | ICD-10-CM | POA: Diagnosis present

## 2021-11-16 DIAGNOSIS — F10231 Alcohol dependence with withdrawal delirium: Secondary | ICD-10-CM | POA: Diagnosis present

## 2021-11-16 DIAGNOSIS — Z96659 Presence of unspecified artificial knee joint: Secondary | ICD-10-CM | POA: Diagnosis present

## 2021-11-16 DIAGNOSIS — T148XXA Other injury of unspecified body region, initial encounter: Secondary | ICD-10-CM

## 2021-11-16 DIAGNOSIS — J9601 Acute respiratory failure with hypoxia: Secondary | ICD-10-CM | POA: Diagnosis present

## 2021-11-16 DIAGNOSIS — N1831 Chronic kidney disease, stage 3a: Secondary | ICD-10-CM | POA: Diagnosis present

## 2021-11-16 DIAGNOSIS — G9341 Metabolic encephalopathy: Secondary | ICD-10-CM | POA: Diagnosis present

## 2021-11-16 DIAGNOSIS — R6520 Severe sepsis without septic shock: Secondary | ICD-10-CM | POA: Diagnosis present

## 2021-11-16 DIAGNOSIS — Z9079 Acquired absence of other genital organ(s): Secondary | ICD-10-CM

## 2021-11-16 LAB — CBC WITH DIFFERENTIAL/PLATELET
Abs Immature Granulocytes: 0.17 10*3/uL — ABNORMAL HIGH (ref 0.00–0.07)
Basophils Absolute: 0.1 10*3/uL (ref 0.0–0.1)
Basophils Relative: 1 %
Eosinophils Absolute: 0 10*3/uL (ref 0.0–0.5)
Eosinophils Relative: 0 %
HCT: 49.2 % (ref 39.0–52.0)
Hemoglobin: 14.8 g/dL (ref 13.0–17.0)
Immature Granulocytes: 1 %
Lymphocytes Relative: 4 %
Lymphs Abs: 0.6 10*3/uL — ABNORMAL LOW (ref 0.7–4.0)
MCH: 24.2 pg — ABNORMAL LOW (ref 26.0–34.0)
MCHC: 30.1 g/dL (ref 30.0–36.0)
MCV: 80.4 fL (ref 80.0–100.0)
Monocytes Absolute: 1 10*3/uL (ref 0.1–1.0)
Monocytes Relative: 6 %
Neutro Abs: 14.5 10*3/uL — ABNORMAL HIGH (ref 1.7–7.7)
Neutrophils Relative %: 88 %
Platelets: 249 10*3/uL (ref 150–400)
RBC: 6.12 MIL/uL — ABNORMAL HIGH (ref 4.22–5.81)
RDW: 20.6 % — ABNORMAL HIGH (ref 11.5–15.5)
WBC Morphology: INCREASED
WBC: 16.3 10*3/uL — ABNORMAL HIGH (ref 4.0–10.5)
nRBC: 0 % (ref 0.0–0.2)

## 2021-11-16 LAB — COMPREHENSIVE METABOLIC PANEL
ALT: 2742 U/L — ABNORMAL HIGH (ref 0–44)
AST: 4310 U/L — ABNORMAL HIGH (ref 15–41)
Albumin: 3.1 g/dL — ABNORMAL LOW (ref 3.5–5.0)
Alkaline Phosphatase: 105 U/L (ref 38–126)
Anion gap: 15 (ref 5–15)
BUN: 52 mg/dL — ABNORMAL HIGH (ref 6–20)
CO2: 24 mmol/L (ref 22–32)
Calcium: 8.5 mg/dL — ABNORMAL LOW (ref 8.9–10.3)
Chloride: 103 mmol/L (ref 98–111)
Creatinine, Ser: 4.24 mg/dL — ABNORMAL HIGH (ref 0.61–1.24)
GFR, Estimated: 16 mL/min — ABNORMAL LOW (ref >60)
Glucose, Bld: 125 mg/dL — ABNORMAL HIGH (ref 70–99)
Potassium: 5 mmol/L (ref 3.5–5.1)
Sodium: 142 mmol/L (ref 135–145)
Total Bilirubin: 1.1 mg/dL (ref 0.3–1.2)
Total Protein: 7.1 g/dL (ref 6.5–8.1)

## 2021-11-16 LAB — CBG MONITORING, ED: Glucose-Capillary: 139 mg/dL — ABNORMAL HIGH (ref 70–99)

## 2021-11-16 LAB — ETHANOL: Alcohol, Ethyl (B): <10 mg/dL (ref <10)

## 2021-11-16 MED ORDER — LACTATED RINGERS IV SOLN: INTRAVENOUS | Status: DC

## 2021-11-16 MED ORDER — LACTATED RINGERS IV BOLUS (SEPSIS): 250.0000 mL | Freq: Once | INTRAVENOUS | Status: DC

## 2021-11-16 MED ORDER — NALOXONE HCL 0.4 MG/ML IJ SOLN
0.4000 mg | Freq: Once | INTRAMUSCULAR | Status: AC
  Administered 2021-11-17: 0.4 mg via INTRAVENOUS
  Filled 2021-11-16: qty 1

## 2021-11-16 MED ORDER — SODIUM CHLORIDE 0.9 % IV SOLN
2.0000 g | INTRAVENOUS | Status: AC
  Administered 2021-11-17 – 2021-11-21 (×5): 2 g via INTRAVENOUS
  Filled 2021-11-16 (×5): qty 20

## 2021-11-16 MED ORDER — SODIUM CHLORIDE 0.9 % IV SOLN
500.0000 mg | INTRAVENOUS | Status: DC
  Administered 2021-11-17 – 2021-11-19 (×3): 500 mg via INTRAVENOUS
  Filled 2021-11-16 (×3): qty 500

## 2021-11-16 MED ORDER — LACTATED RINGERS IV BOLUS (SEPSIS)
1000.0000 mL | Freq: Once | INTRAVENOUS | Status: AC
  Administered 2021-11-17: 1000 mL via INTRAVENOUS

## 2021-11-16 NOTE — ED Triage Notes (Signed)
Pt bib EMS from home. Pt missed his morning dose of methadone today. Tremors, lethargy, loss of balance, decreased coordination (dropping objects) at home, per pt's friend report to EMS. EMS CBG 293 (no diabetes dx).  EMS: 88% room air, 95% on 3L nasal cannula 126/74 HR 72

## 2021-11-16 NOTE — Sepsis Progress Note (Signed)
Elink following Code Sepsis.  Notified provider of need to order lactic acid.  
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
## 2021-11-16 NOTE — ED Notes (Signed)
The pt keeping both his eyes closed pupils 2 bi-lateral  sluggish to react  pt too sleepy to answer questions asked.  Nsr on monitor

## 2021-11-17 ENCOUNTER — Encounter (HOSPITAL_COMMUNITY): Payer: Self-pay | Admitting: Family Medicine

## 2021-11-17 ENCOUNTER — Inpatient Hospital Stay (HOSPITAL_COMMUNITY): Payer: Medicaid Other

## 2021-11-17 DIAGNOSIS — N170 Acute kidney failure with tubular necrosis: Secondary | ICD-10-CM | POA: Diagnosis present

## 2021-11-17 DIAGNOSIS — K7200 Acute and subacute hepatic failure without coma: Secondary | ICD-10-CM | POA: Diagnosis present

## 2021-11-17 DIAGNOSIS — J690 Pneumonitis due to inhalation of food and vomit: Secondary | ICD-10-CM | POA: Diagnosis present

## 2021-11-17 DIAGNOSIS — E870 Hyperosmolality and hypernatremia: Secondary | ICD-10-CM | POA: Diagnosis not present

## 2021-11-17 DIAGNOSIS — G9341 Metabolic encephalopathy: Secondary | ICD-10-CM | POA: Diagnosis present

## 2021-11-17 DIAGNOSIS — A419 Sepsis, unspecified organism: Secondary | ICD-10-CM | POA: Diagnosis present

## 2021-11-17 DIAGNOSIS — N1831 Chronic kidney disease, stage 3a: Secondary | ICD-10-CM | POA: Diagnosis present

## 2021-11-17 DIAGNOSIS — J9622 Acute and chronic respiratory failure with hypercapnia: Secondary | ICD-10-CM | POA: Diagnosis present

## 2021-11-17 DIAGNOSIS — R6520 Severe sepsis without septic shock: Secondary | ICD-10-CM | POA: Diagnosis present

## 2021-11-17 DIAGNOSIS — T40601A Poisoning by unspecified narcotics, accidental (unintentional), initial encounter: Secondary | ICD-10-CM | POA: Diagnosis present

## 2021-11-17 DIAGNOSIS — F1123 Opioid dependence with withdrawal: Secondary | ICD-10-CM | POA: Diagnosis not present

## 2021-11-17 DIAGNOSIS — N400 Enlarged prostate without lower urinary tract symptoms: Secondary | ICD-10-CM | POA: Diagnosis not present

## 2021-11-17 DIAGNOSIS — F319 Bipolar disorder, unspecified: Secondary | ICD-10-CM | POA: Diagnosis present

## 2021-11-17 DIAGNOSIS — N179 Acute kidney failure, unspecified: Secondary | ICD-10-CM | POA: Diagnosis present

## 2021-11-17 DIAGNOSIS — I700 Atherosclerosis of aorta: Secondary | ICD-10-CM

## 2021-11-17 DIAGNOSIS — J9621 Acute and chronic respiratory failure with hypoxia: Secondary | ICD-10-CM | POA: Diagnosis present

## 2021-11-17 DIAGNOSIS — F10231 Alcohol dependence with withdrawal delirium: Secondary | ICD-10-CM | POA: Diagnosis present

## 2021-11-17 DIAGNOSIS — R7401 Elevation of levels of liver transaminase levels: Secondary | ICD-10-CM

## 2021-11-17 DIAGNOSIS — N320 Bladder-neck obstruction: Secondary | ICD-10-CM | POA: Diagnosis not present

## 2021-11-17 DIAGNOSIS — J189 Pneumonia, unspecified organism: Secondary | ICD-10-CM | POA: Diagnosis present

## 2021-11-17 DIAGNOSIS — J449 Chronic obstructive pulmonary disease, unspecified: Secondary | ICD-10-CM

## 2021-11-17 DIAGNOSIS — Z8619 Personal history of other infectious and parasitic diseases: Secondary | ICD-10-CM | POA: Diagnosis not present

## 2021-11-17 DIAGNOSIS — Z20822 Contact with and (suspected) exposure to covid-19: Secondary | ICD-10-CM | POA: Diagnosis present

## 2021-11-17 DIAGNOSIS — F1410 Cocaine abuse, uncomplicated: Secondary | ICD-10-CM | POA: Diagnosis present

## 2021-11-17 DIAGNOSIS — J432 Centrilobular emphysema: Secondary | ICD-10-CM | POA: Diagnosis present

## 2021-11-17 DIAGNOSIS — K5900 Constipation, unspecified: Secondary | ICD-10-CM | POA: Diagnosis not present

## 2021-11-17 DIAGNOSIS — R739 Hyperglycemia, unspecified: Secondary | ICD-10-CM | POA: Diagnosis present

## 2021-11-17 LAB — URINALYSIS, COMPLETE (UACMP) WITH MICROSCOPIC
Bacteria, UA: NONE SEEN
Bilirubin Urine: NEGATIVE
Glucose, UA: NEGATIVE mg/dL
Ketones, ur: NEGATIVE mg/dL
Leukocytes,Ua: NEGATIVE
Nitrite: NEGATIVE
Protein, ur: 30 mg/dL — AB
Specific Gravity, Urine: 1.017 (ref 1.005–1.030)
pH: 5 (ref 5.0–8.0)

## 2021-11-17 LAB — COMPREHENSIVE METABOLIC PANEL
ALT: 1908 U/L — ABNORMAL HIGH (ref 0–44)
AST: 2411 U/L — ABNORMAL HIGH (ref 15–41)
Albumin: 2.6 g/dL — ABNORMAL LOW (ref 3.5–5.0)
Alkaline Phosphatase: 86 U/L (ref 38–126)
Anion gap: 12 (ref 5–15)
BUN: 49 mg/dL — ABNORMAL HIGH (ref 6–20)
CO2: 28 mmol/L (ref 22–32)
Calcium: 8.3 mg/dL — ABNORMAL LOW (ref 8.9–10.3)
Chloride: 103 mmol/L (ref 98–111)
Creatinine, Ser: 3.23 mg/dL — ABNORMAL HIGH (ref 0.61–1.24)
GFR, Estimated: 23 mL/min — ABNORMAL LOW (ref >60)
Glucose, Bld: 128 mg/dL — ABNORMAL HIGH (ref 70–99)
Potassium: 4.3 mmol/L (ref 3.5–5.1)
Sodium: 143 mmol/L (ref 135–145)
Total Bilirubin: 1.2 mg/dL (ref 0.3–1.2)
Total Protein: 6 g/dL — ABNORMAL LOW (ref 6.5–8.1)

## 2021-11-17 LAB — CBC
HCT: 42.5 % (ref 39.0–52.0)
Hemoglobin: 12.8 g/dL — ABNORMAL LOW (ref 13.0–17.0)
MCH: 23.8 pg — ABNORMAL LOW (ref 26.0–34.0)
MCHC: 30.1 g/dL (ref 30.0–36.0)
MCV: 79 fL — ABNORMAL LOW (ref 80.0–100.0)
Platelets: 224 10*3/uL (ref 150–400)
RBC: 5.38 MIL/uL (ref 4.22–5.81)
RDW: 20.3 % — ABNORMAL HIGH (ref 11.5–15.5)
WBC: 15.5 10*3/uL — ABNORMAL HIGH (ref 4.0–10.5)
nRBC: 0 % (ref 0.0–0.2)

## 2021-11-17 LAB — PROTIME-INR
INR: 2.1 — ABNORMAL HIGH (ref 0.8–1.2)
Prothrombin Time: 23.6 seconds — ABNORMAL HIGH (ref 11.4–15.2)

## 2021-11-17 LAB — HEPATITIS PANEL, ACUTE
HCV Ab: REACTIVE — AB
Hep A IgM: NONREACTIVE
Hep B C IgM: NONREACTIVE
Hepatitis B Surface Ag: NONREACTIVE

## 2021-11-17 LAB — PHOSPHORUS: Phosphorus: 3 mg/dL (ref 2.5–4.6)

## 2021-11-17 LAB — SODIUM, URINE, RANDOM: Sodium, Ur: 86 mmol/L

## 2021-11-17 LAB — RAPID URINE DRUG SCREEN, HOSP PERFORMED
Amphetamines: NOT DETECTED
Barbiturates: NOT DETECTED
Benzodiazepines: NOT DETECTED
Cocaine: POSITIVE — AB
Opiates: NOT DETECTED
Tetrahydrocannabinol: NOT DETECTED

## 2021-11-17 LAB — I-STAT VENOUS BLOOD GAS, ED
Acid-Base Excess: 3 mmol/L — ABNORMAL HIGH (ref 0.0–2.0)
Bicarbonate: 26.7 mmol/L (ref 20.0–28.0)
Calcium, Ion: 0.91 mmol/L — ABNORMAL LOW (ref 1.15–1.40)
HCT: 39 % (ref 39.0–52.0)
Hemoglobin: 13.3 g/dL (ref 13.0–17.0)
O2 Saturation: 98 %
Potassium: 4.1 mmol/L (ref 3.5–5.1)
Sodium: 141 mmol/L (ref 135–145)
TCO2: 28 mmol/L (ref 22–32)
pCO2, Ven: 35.6 mmHg — ABNORMAL LOW (ref 44.0–60.0)
pH, Ven: 7.482 — ABNORMAL HIGH (ref 7.250–7.430)
pO2, Ven: 102 mmHg — ABNORMAL HIGH (ref 32.0–45.0)

## 2021-11-17 LAB — RESP PANEL BY RT-PCR (FLU A&B, COVID) ARPGX2
Influenza A by PCR: NEGATIVE
Influenza B by PCR: NEGATIVE
SARS Coronavirus 2 by RT PCR: NEGATIVE

## 2021-11-17 LAB — MAGNESIUM: Magnesium: 2.3 mg/dL (ref 1.7–2.4)

## 2021-11-17 LAB — LACTIC ACID, PLASMA
Lactic Acid, Venous: 2 mmol/L (ref 0.5–1.9)
Lactic Acid, Venous: 2.1 mmol/L (ref 0.5–1.9)

## 2021-11-17 LAB — PROCALCITONIN: Procalcitonin: 14.79 ng/mL

## 2021-11-17 LAB — ACETAMINOPHEN LEVEL: Acetaminophen (Tylenol), Serum: <10 ug/mL — ABNORMAL LOW (ref 10–30)

## 2021-11-17 LAB — AMMONIA: Ammonia: 42 umol/L — ABNORMAL HIGH (ref 9–35)

## 2021-11-17 LAB — CREATININE, URINE, RANDOM: Creatinine, Urine: 123.56 mg/dL

## 2021-11-17 MED ORDER — THIAMINE HCL 100 MG/ML IJ SOLN
100.0000 mg | Freq: Every day | INTRAMUSCULAR | Status: DC
  Administered 2021-11-17 – 2021-11-21 (×4): 100 mg via INTRAVENOUS
  Filled 2021-11-17 (×4): qty 2

## 2021-11-17 MED ORDER — ALBUTEROL SULFATE (2.5 MG/3ML) 0.083% IN NEBU
2.5000 mg | INHALATION_SOLUTION | Freq: Four times a day (QID) | RESPIRATORY_TRACT | Status: DC | PRN
  Administered 2021-11-17 – 2021-11-19 (×2): 2.5 mg via RESPIRATORY_TRACT
  Filled 2021-11-17 (×2): qty 3

## 2021-11-17 MED ORDER — FOLIC ACID 1 MG PO TABS
1.0000 mg | ORAL_TABLET | Freq: Every day | ORAL | Status: DC
  Administered 2021-11-19: 1 mg via ORAL
  Filled 2021-11-17 (×2): qty 1

## 2021-11-17 MED ORDER — ONDANSETRON HCL 4 MG/2ML IJ SOLN: 4.0000 mg | Freq: Four times a day (QID) | INTRAMUSCULAR | Status: DC | PRN

## 2021-11-17 MED ORDER — LORAZEPAM 2 MG/ML IJ SOLN
1.0000 mg | INTRAMUSCULAR | Status: DC | PRN
  Administered 2021-11-17: 4 mg via INTRAVENOUS
  Administered 2021-11-17: 2 mg via INTRAVENOUS
  Administered 2021-11-17: 1 mg via INTRAVENOUS
  Administered 2021-11-18 (×2): 2 mg via INTRAVENOUS
  Administered 2021-11-19: 4 mg via INTRAVENOUS
  Filled 2021-11-17: qty 1
  Filled 2021-11-17: qty 2
  Filled 2021-11-17 (×2): qty 1
  Filled 2021-11-17: qty 2
  Filled 2021-11-17 (×2): qty 1

## 2021-11-17 MED ORDER — FERROUS SULFATE 325 (65 FE) MG PO TABS
325.0000 mg | ORAL_TABLET | Freq: Two times a day (BID) | ORAL | Status: DC
  Administered 2021-11-19 – 2021-11-25 (×9): 325 mg via ORAL
  Filled 2021-11-17 (×11): qty 1

## 2021-11-17 MED ORDER — CHLORHEXIDINE GLUCONATE CLOTH 2 % EX PADS
6.0000 | MEDICATED_PAD | Freq: Every day | CUTANEOUS | Status: DC
  Administered 2021-11-18 – 2021-11-23 (×6): 6 via TOPICAL

## 2021-11-17 MED ORDER — GABAPENTIN 300 MG PO CAPS: 600.0000 mg | ORAL_CAPSULE | Freq: Three times a day (TID) | ORAL | Status: DC

## 2021-11-17 MED ORDER — THIAMINE HCL 100 MG PO TABS
100.0000 mg | ORAL_TABLET | Freq: Every day | ORAL | Status: DC
  Administered 2021-11-19 – 2021-11-25 (×5): 100 mg via ORAL
  Filled 2021-11-17 (×5): qty 1

## 2021-11-17 MED ORDER — HEPARIN SODIUM (PORCINE) 5000 UNIT/ML IJ SOLN
5000.0000 [IU] | Freq: Three times a day (TID) | INTRAMUSCULAR | Status: DC
  Administered 2021-11-17 – 2021-11-19 (×7): 5000 [IU] via SUBCUTANEOUS
  Filled 2021-11-17 (×7): qty 1

## 2021-11-17 MED ORDER — LORAZEPAM 2 MG/ML IJ SOLN
1.0000 mg | INTRAMUSCULAR | Status: DC | PRN
  Administered 2021-11-17: 1 mg via INTRAVENOUS
  Filled 2021-11-17: qty 1

## 2021-11-17 MED ORDER — METRONIDAZOLE 500 MG/100ML IV SOLN
500.0000 mg | Freq: Two times a day (BID) | INTRAVENOUS | Status: DC
  Administered 2021-11-17 – 2021-11-20 (×7): 500 mg via INTRAVENOUS
  Filled 2021-11-17 (×7): qty 100

## 2021-11-17 MED ORDER — TAMSULOSIN HCL 0.4 MG PO CAPS
0.4000 mg | ORAL_CAPSULE | Freq: Every day | ORAL | Status: DC
  Administered 2021-11-19 – 2021-11-25 (×6): 0.4 mg via ORAL
  Filled 2021-11-17 (×7): qty 1

## 2021-11-17 MED ORDER — GABAPENTIN 300 MG PO CAPS: 300.0000 mg | ORAL_CAPSULE | Freq: Three times a day (TID) | ORAL | Status: DC

## 2021-11-17 MED ORDER — NALOXONE HCL 0.4 MG/ML IJ SOLN: 0.2000 mg | INTRAMUSCULAR | Status: DC | PRN

## 2021-11-17 MED ORDER — LACTATED RINGERS IV SOLN: INTRAVENOUS | Status: AC

## 2021-11-17 MED ORDER — ADULT MULTIVITAMIN W/MINERALS CH
1.0000 | ORAL_TABLET | Freq: Every day | ORAL | Status: DC
  Administered 2021-11-19 – 2021-11-25 (×6): 1 via ORAL
  Filled 2021-11-17 (×7): qty 1

## 2021-11-17 MED ORDER — LORAZEPAM 2 MG/ML IJ SOLN
1.0000 mg | Freq: Once | INTRAMUSCULAR | Status: AC
  Administered 2021-11-17: 1 mg via INTRAVENOUS
  Filled 2021-11-17: qty 1

## 2021-11-17 MED ORDER — SODIUM CHLORIDE 0.9% FLUSH
3.0000 mL | Freq: Two times a day (BID) | INTRAVENOUS | Status: DC
  Administered 2021-11-17 – 2021-11-24 (×14): 3 mL via INTRAVENOUS

## 2021-11-17 MED ORDER — LORAZEPAM 1 MG PO TABS: 1.0000 mg | ORAL_TABLET | ORAL | Status: DC | PRN

## 2021-11-17 NOTE — ED Notes (Signed)
The pt is attempting to talk  not answering many questions  I can understand  some words he may be asking why we have  placedx thew restraints

## 2021-11-17 NOTE — ED Notes (Signed)
Admitting doctor has seen the pt 

## 2021-11-17 NOTE — Assessment & Plan Note (Addendum)
Opiate overdose plus sepsis plus probably gabapentin and renal failure.  Ammonia not high enough to explain this.  BUN not high enough to need HD.  May be some withdrawal from alcohol, can't get a good EToh history. -Continue IV fluids -Hold opiates and gabapentin - Trend Cr and LFts - Thiamine and folate  - CIWA protocol

## 2021-11-17 NOTE — Hospital Course (Addendum)
Ernest Reese is a 50 y.o. M with opiate dependence, hx IVDA, COPD not on O2, CKD IIIa, and hep s/p streatment who presented for sluggishness, confusion, hypoxia.  Friend activated EMS because patient was altered.  In the ER, he was hypoxic to 80s, RR 12, CT showed basilar lung infiltrates, Cr 4.24 from baseline 1.2 and LFTs >4000.     11/21: Admitted 11/22: Remained encephalopathic, delirious 11/23: PCCM consulted and transferred to ICU on Precedex 11/25: Off Precedex, transferred [place] 11/27: Still sleepy, somewhat disoriented and not following commands consistently

## 2021-11-17 NOTE — Assessment & Plan Note (Signed)
Compounded by renal failure
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
## 2021-11-17 NOTE — ED Notes (Signed)
Pt got loose out of left arm restraint. Attempting to put off condom cath. Pt yelling, "I have to pee. I have to pee." Pt currently urinating in condom cath. Pt restrained again at this time and given ativan.

## 2021-11-17 NOTE — ED Notes (Signed)
The pt is more calm  he has pulled off his condom  catheter and voided in the floor  bed changed  and the pt has had his 2 liters of lr

## 2021-11-17 NOTE — Assessment & Plan Note (Addendum)
LFTs improving.  INR 2.1 noted.  Ammonia pretty low.  AST/ALT ratio close to 2:1, but levels too high for alcohol probably, I think there is an ischemic component (given his AKI as well).  TBili normal. - Trend CMP, INR

## 2021-11-17 NOTE — Sepsis Progress Note (Addendum)
Notified New provider of need to order Lactic acids.  No Blood cultures collected. Messaged Bedside RN about need to collect Blood cultures as well. Pt is being noncompliant with care.  Abx already given

## 2021-11-17 NOTE — ED Notes (Signed)
Narcan 0.4mg  given iv  pt almost immedaitely sopened his eyes started fightiung to get up  pulling at everything.  Almost jumped off the bottom of the bed  no help in sight  pushed muy distress button  had help to hold him down and restrain ing him  out of control

## 2021-11-17 NOTE — Assessment & Plan Note (Signed)
-   CIWA 

## 2021-11-17 NOTE — Assessment & Plan Note (Signed)
Respiratory failure ruled out. 

## 2021-11-17 NOTE — ED Notes (Signed)
US tech at bedside

## 2021-11-17 NOTE — Assessment & Plan Note (Addendum)
Continue Rocephin azith, flagyl

## 2021-11-17 NOTE — Assessment & Plan Note (Signed)
Acute hepatitis panel noted for hep C Ab positive, as would be.

## 2021-11-17 NOTE — ED Notes (Signed)
Verbal order for 4-point restraints from [facility]

## 2021-11-17 NOTE — ED Notes (Signed)
Pt awake and attempting to pull at lines. Pt appearing very anxious. Restraints remain in place.

## 2021-11-17 NOTE — Assessment & Plan Note (Addendum)
Improving with fulids.  UA with hyaline casts, no inflammatory sediment.  CT showed bladder distension, will follow up with Renal US to monitor -Continue iv fluids - Follow urine lytes - Obtain US renal - Strict I/Os

## 2021-11-17 NOTE — Assessment & Plan Note (Addendum)
-  Continue abx

## 2021-11-17 NOTE — Progress Notes (Signed)
Progress Note    Ernest Reese   ZOX:[id number]  DOB: 15-Aug-1971  DOA: 11/16/2021     0 Date of Service: 11/17/2021   Brief summary: Mr. Ernest Reese is a 50 y.o. M with opiate dependence, hx IVDA, COPD not on O2, CKD IIIa, and hep s/p streatment who presented for sluggishness, confusion, hypoxia.  Friend activated EMS because patient was altered.  In the ER, he was hypoxic to 80s, RR 12, CT showed basilar lung infiltrates, Cr 4.24 from baseline 1.2 and LFTs >4000.             Assessment and Plan * Severe sepsis with acute organ dysfunction (HCC) Continue Rocephin azith, flagyl  Acute metabolic encephalopathy Opiate overdose plus sepsis plus probably gabapentin and renal failure.  Ammonia not high enough to explain this.  BUN not high enough to need HD.  May be some withdrawal from alcohol, can't get a good EToh history. -Continue IV fluids -Hold opiates and gabapentin - Trend Cr and LFts - Thiamine and folate  - CIWA protocol  Ischemic hepatitis LFTs improving.  INR 2.1 noted.  Ammonia pretty low.  AST/ALT ratio close to 2:1, but levels too high for alcohol probably, I think there is an ischemic component (given his AKI as well).  TBili normal. - Trend CMP, INR  Aspiration pneumonia (HCC) -Continue abx   Acute renal failure superimposed on stage 3a chronic kidney disease (HCC)  Improving with fulids.  UA with hyaline casts, no inflammatory sediment.  CT showed bladder distension, will follow up with Renal US to monitor -Continue iv fluids - Follow urine lytes - Obtain US renal - Strict I/Os  Opiate overdose (HCC) Compounded by renal failure  COPD (chronic obstructive pulmonary disease) (HCC) Respiratory failure ruled out  Atherosclerosis of aorta (HCC)    History of hepatitis C Acute hepatitis panel noted for hep C Ab positive, as would be.  Polysubstance abuse ([place]) - CIWA     Subjective:  Patient is obtunded, forcibly keeps his eyes closed, does not  respond to verbal stimuli, no purposeful movements, in restraints.  Nursing note that he is agitated and uncooperative, removing his condom catheter repeatedly, making no purposeful verbalizations.  No obvious cough.  No fever.  Objective Vitals:   11/17/21 0800 11/17/21 0830 11/17/21 0915 11/17/21 1000  BP: (!) 125/92  (!) 121/96 135/67  Pulse: 85 83 86 62  Resp: 20 17 14 14   Temp:      TempSrc:      SpO2: 100% 99% 95% 95%  Weight:      Height:       72.6 kg  Vital signs have improved with IV fluids, heart rate and blood pressure currently normal.  He was able to be weaned off oxygen without hypoxia.   Exam General appearance: Small stature, disheveled adult male, in restraints, does not open eyes, has urinated on himself.     HEENT: When I forcefully open the eyes, the conjunctive a, lids and lashes are normal.  The pupils are normal in size and appear to react to light, and are symmetric.  There is no nasal deformity or discharge.  He has some saliva in his beard, crusted and dried.  He does not open the mouth.  He appears edentulous based on the contours of his lips. Skin: No suspicious rashes or lesions.  No stigmata of chronic liver disease. Cardiac: RRR, no murmurs, no lower extremity edema, no JVD. Respiratory: I did not appreciate rales or wheezes, although  he does not cooperate with exam or set up for lung exam, there is no wheezing.  Respiratory rate appears normal. Abdomen: Abdomen is soft, no voluntary or involuntary guarding, no rigidity.  No grimace to palpation. MSK: Normal muscle bulk and tone, somewhat thin.  No deformities or effusions Neuro: He groans and forcefully keeps his eyes closed, he makes no spontaneous verbalizations, he appears sleepy.  He does not open his eyes.  He does not follow commands. Psych: Unable to assess    Labs / Other Information My review of labs, imaging, notes and other tests is significant for Elevated creatinine, elevated LFTs,  elevated INR, chest imaging with pneumonia, urinalysis with hyaline casts, CT of the abdomen and pelvis with some distended bladder.     Disposition Plan: Status is: Inpatient  Remains inpatient appropriate because: He has severe sepsis, still attending, will require ongoing treatment.        Time spent: 35 minutes [facility] 11/17/2021, 12:08 PM

## 2021-11-18 DIAGNOSIS — J690 Pneumonitis due to inhalation of food and vomit: Secondary | ICD-10-CM | POA: Diagnosis not present

## 2021-11-18 DIAGNOSIS — N320 Bladder-neck obstruction: Secondary | ICD-10-CM | POA: Diagnosis present

## 2021-11-18 DIAGNOSIS — N179 Acute kidney failure, unspecified: Secondary | ICD-10-CM | POA: Diagnosis not present

## 2021-11-18 DIAGNOSIS — A419 Sepsis, unspecified organism: Secondary | ICD-10-CM | POA: Diagnosis not present

## 2021-11-18 DIAGNOSIS — G9341 Metabolic encephalopathy: Secondary | ICD-10-CM | POA: Diagnosis not present

## 2021-11-18 LAB — COMPREHENSIVE METABOLIC PANEL
ALT: 1297 U/L — ABNORMAL HIGH (ref 0–44)
AST: 795 U/L — ABNORMAL HIGH (ref 15–41)
Albumin: 2.4 g/dL — ABNORMAL LOW (ref 3.5–5.0)
Alkaline Phosphatase: 95 U/L (ref 38–126)
Anion gap: 7 (ref 5–15)
BUN: 41 mg/dL — ABNORMAL HIGH (ref 6–20)
CO2: 31 mmol/L (ref 22–32)
Calcium: 8.3 mg/dL — ABNORMAL LOW (ref 8.9–10.3)
Chloride: 105 mmol/L (ref 98–111)
Creatinine, Ser: 1.71 mg/dL — ABNORMAL HIGH (ref 0.61–1.24)
GFR, Estimated: 49 mL/min — ABNORMAL LOW (ref >60)
Glucose, Bld: 89 mg/dL (ref 70–99)
Potassium: 4.2 mmol/L (ref 3.5–5.1)
Sodium: 143 mmol/L (ref 135–145)
Total Bilirubin: 0.8 mg/dL (ref 0.3–1.2)
Total Protein: 5.7 g/dL — ABNORMAL LOW (ref 6.5–8.1)

## 2021-11-18 LAB — PROTIME-INR
INR: 1.7 — ABNORMAL HIGH (ref 0.8–1.2)
Prothrombin Time: 19.7 seconds — ABNORMAL HIGH (ref 11.4–15.2)

## 2021-11-18 LAB — CBC
HCT: 44.2 % (ref 39.0–52.0)
Hemoglobin: 13.4 g/dL (ref 13.0–17.0)
MCH: 23.8 pg — ABNORMAL LOW (ref 26.0–34.0)
MCHC: 30.3 g/dL (ref 30.0–36.0)
MCV: 78.5 fL — ABNORMAL LOW (ref 80.0–100.0)
Platelets: 187 10*3/uL (ref 150–400)
RBC: 5.63 MIL/uL (ref 4.22–5.81)
RDW: 20 % — ABNORMAL HIGH (ref 11.5–15.5)
WBC: 12.9 10*3/uL — ABNORMAL HIGH (ref 4.0–10.5)
nRBC: 0 % (ref 0.0–0.2)

## 2021-11-18 LAB — AMMONIA: Ammonia: 32 umol/L (ref 9–35)

## 2021-11-18 LAB — PROCALCITONIN: Procalcitonin: 7.44 ng/mL

## 2021-11-18 MED ORDER — HALOPERIDOL LACTATE 5 MG/ML IJ SOLN
5.0000 mg | Freq: Once | INTRAMUSCULAR | Status: AC
  Administered 2021-11-18: 5 mg via INTRAVENOUS
  Filled 2021-11-18: qty 1

## 2021-11-18 MED ORDER — SODIUM CHLORIDE 0.9 % IV SOLN
INTRAVENOUS | Status: DC
Start: 2021-11-18 — End: 2021-11-19

## 2021-11-18 NOTE — Assessment & Plan Note (Signed)
LFTs improving again.  INR 2.1 on admission, also improving.    Ammonia pretty low.  TBili normal.  I suspect this is mostly ischemic given the high level.  AST/ALT ratio was up before, but 4000 seems too high for alcohol alone.  - Trend CMP, INR

## 2021-11-18 NOTE — Progress Notes (Signed)
PT Cancellation Note  Patient Details Name: Ernest Reese MRN: [id number] DOB: 02/07/1971   Cancelled Treatment:    Reason Eval/Treat Not Completed: Patient not medically ready. Patient is not awake/alert for PT evaluation. RN suggests we re-attempt tomorrow.      [name] 11/18/2021, 2:35 PM

## 2021-11-18 NOTE — Assessment & Plan Note (Signed)
Opiate overdose plus sepsis plus probably gabapentin and renal failure.  Ammonia not high enough to explain this.  BUN not high enough to need HD.  CT head unremarkable. He previously has consistently negative alcohol use screens, so I don't feel this is clearly DTs, but that is possible.    -Continue IV fluids -Continue thiamine and folate and empiric CIWA - Hold opiates and gabapentin  - PRN Haldol for agitation and safety given he requires restraints - Trend Cr and LFts  - If not alert and cooperative tomorrow, will obtain MRI brain and Neurology consultation

## 2021-11-18 NOTE — Progress Notes (Signed)
HOSPITAL MEDICINE OVERNIGHT EVENT NOTE    Nursing reports that patient continues to exhibit agitation and confusion.  Patient continuing to pull at medical devices, attempting to get out of bed and not following commands.  Patient continues to place himself at risk of self injury and is obstructing medical care.  Patient continues to exhibit this behavior despite receiving continued as needed benzodiazepines and Haldol.  Because of this, we will renew patient's current restraint order.  Continue to monitor patient closely.    Ernest Emerald  MD [facility]

## 2021-11-18 NOTE — Progress Notes (Signed)
[place]  Please contact pts daughter at this phone number

## 2021-11-18 NOTE — Progress Notes (Signed)
Progress Note    Ernest Reese   [id number]  DOB: [birth year]  DOA: 11/16/2021     1 Date of Service: 11/18/2021      Brief summary: Ernest Reese is a 50 y.o. M with opiate dependence, hx IVDA, COPD not on O2, CKD IIIa, and hep s/p streatment who presented for sluggishness, confusion, hypoxia.  Friend activated EMS because patient was altered.  In the ER, he was hypoxic to 80s, RR 12, CT showed basilar lung infiltrates, Cr 4.24 from baseline 1.2 and LFTs >4000.     11/21: Admitted 11/22: Remains very encephalopathic, delirious        Assessment and Plan * Severe sepsis with acute organ dysfunction [facility]) Patient presented with HR > 90, WBC 16K, elevated lactic acid, encephalopathy, renal failure and liver injury in setting of pneumonia and procal 14.  - Continue Rocephin, Flagyl, azithromycin  - Follow blood culture  Acute metabolic encephalopathy Opiate overdose plus sepsis plus probably gabapentin and renal failure.  Ammonia not high enough to explain this.  BUN not high enough to need HD.  CT head unremarkable. He previously has consistently negative alcohol use screens, so I don't feel this is clearly DTs, but that is possible.    -Continue IV fluids -Continue thiamine and folate and empiric CIWA - Hold opiates and gabapentin  - PRN Haldol for agitation and safety given he requires restraints - Trend Cr and LFts  - If not alert and cooperative tomorrow, will obtain MRI brain and Neurology consultation  Ischemic hepatitis LFTs improving again.  INR 2.1 on admission, also improving.    Ammonia pretty low.  TBili normal.  I suspect this is mostly ischemic given the high level.  AST/ALT ratio was up before, but 4000 seems too high for alcohol alone.  - Trend CMP, INR  Acute renal failure superimposed on stage 3a chronic kidney disease ([place])  Improving with fulids.  UA with hyaline casts, no inflammatory sediment.  CT showed bladder distension, will follow up  with Renal US to monitor.  FeNA consistent with ATN, which thankfully is improving.  - Trend Cr - Continue iv fluids - Strict I/Os  Bladder obstruction Bladder distended on imaging.  Foley placed - Continue new Flomax - Voiding trial when awake  Opiate overdose (HCC) Compounded by renal failure and gabapentin  COPD (chronic obstructive pulmonary disease) (HCC) Respiratory failure ruled out, he is not hypoxic or requiring oxygen  Atherosclerosis of aorta ([place])  Should be on aspirin, statin  History of hepatitis C Acute hepatitis panel noted for hep C Ab positive, as would be.  Polysubstance abuse ([place]) Unclear history - CIWA     Subjective:  Agitated overnight, getting up and banging on the walls, hollering, disoriented no respiratory distress, no vomiting, no diarrhea, no fever.  No other nursing concerns.  The patient is to sleepy and sluggish to respond to questions.  Objective Vitals:   11/18/21 1200 11/18/21 1300 11/18/21 1400 11/18/21 1500  BP: 124/79  (!) 141/76   Pulse: 80  77   Resp: 14 16 15 12   Temp:      TempSrc:      SpO2:      Weight:      Height:       68.2 kg  Vital signs were reviewed and unremarkable.   Exam General appearance: Thin adult male, sitting up in bed, restrained, agitated, disheveled, does not open the eyes     HEENT: Eyes closed, refuses to open  them, edentulous, but does not open mouth exam, no nasal deformity or discharge Skin: No suspicious rashes or lesions Cardiac: RRR, no murmurs, no lower extremity edema Respiratory: Normal respiratory rate and rhythm, coarse upper airway sounds and some crackles bilaterally, good air movement, no wheezing Abdomen: No grimace to palpation, no guarding, no rigidity  MSK: Thin, no deformities or effusions Neuro: Responds to sternal rub, states his name, makes no other answers, does not follow commands. Psych: Unable to assess    Labs / Other Information My review of labs, imaging,  notes and other tests is significant for LFTs continuing to improve, creatinine down to 1.7, BUN 41, white blood cell count down, Fina 1.6, INR down to 1.7     Disposition Plan: Status is: Inpatient  Remains inpatient appropriate because: Admitted with severe sepsis.  He is still quite delirious.  My leading suspicion is that this is from gabapentin in the setting of his renal failure, although I expect this to resolve not his renal function is obviously improving.  It may also be alcohol withdrawal, although if he is not improving by tomorrow, would obtain MRI in consultation.        Time spent: 35 minutes [facility] 11/18/2021, 6:25 PM

## 2021-11-18 NOTE — Assessment & Plan Note (Signed)
Improving with fulids.  UA with hyaline casts, no inflammatory sediment.  CT showed bladder distension, will follow up with Renal US to monitor.  FeNA consistent with ATN, which thankfully is improving.  - Trend Cr - Continue iv fluids - Strict I/Os

## 2021-11-18 NOTE — Assessment & Plan Note (Signed)
Compounded by renal failure and gabapentin

## 2021-11-18 NOTE — Assessment & Plan Note (Signed)
Bladder distended on imaging.  Foley placed - Continue new Flomax - Voiding trial when awake

## 2021-11-18 NOTE — Assessment & Plan Note (Signed)
Patient presented with HR > 90, WBC 16K, elevated lactic acid, encephalopathy, renal failure and liver injury in setting of pneumonia and procal 14.  - Continue Rocephin, Flagyl, azithromycin  - Follow blood culture

## 2021-11-18 NOTE — Assessment & Plan Note (Signed)
Respiratory failure ruled out, he is not hypoxic or requiring oxygen

## 2021-11-18 NOTE — Assessment & Plan Note (Signed)
Should be on aspirin, statin

## 2021-11-18 NOTE — Plan of Care (Signed)
  Problem: Safety: Goal: Violent Restraint(s) Outcome: Not Progressing   Problem: Safety: Goal: Non-violent Restraint(s) Outcome: Not Progressing

## 2021-11-18 NOTE — Assessment & Plan Note (Signed)
Acute hepatitis panel noted for hep C Ab positive, as would be.

## 2021-11-18 NOTE — Assessment & Plan Note (Signed)
Unclear history - CIWA

## 2021-11-19 ENCOUNTER — Inpatient Hospital Stay (HOSPITAL_COMMUNITY): Payer: Medicaid Other

## 2021-11-19 DIAGNOSIS — A419 Sepsis, unspecified organism: Principal | ICD-10-CM

## 2021-11-19 DIAGNOSIS — R6520 Severe sepsis without septic shock: Secondary | ICD-10-CM | POA: Diagnosis not present

## 2021-11-19 LAB — BLOOD GAS, ARTERIAL
Acid-Base Excess: 4.1 mmol/L — ABNORMAL HIGH (ref 0.0–2.0)
Bicarbonate: 27.9 mmol/L (ref 20.0–28.0)
Drawn by: 59156
FIO2: 36
O2 Saturation: 90.9 %
Patient temperature: 37
pCO2 arterial: 40.5 mmHg (ref 32.0–48.0)
pH, Arterial: 7.453 — ABNORMAL HIGH (ref 7.350–7.450)
pO2, Arterial: 59.4 mmHg — ABNORMAL LOW (ref 83.0–108.0)

## 2021-11-19 LAB — MRSA NEXT GEN BY PCR, NASAL: MRSA by PCR Next Gen: NOT DETECTED

## 2021-11-19 LAB — CBC
HCT: 43.4 % (ref 39.0–52.0)
Hemoglobin: 13.1 g/dL (ref 13.0–17.0)
MCH: 23.6 pg — ABNORMAL LOW (ref 26.0–34.0)
MCHC: 30.2 g/dL (ref 30.0–36.0)
MCV: 78.3 fL — ABNORMAL LOW (ref 80.0–100.0)
Platelets: 165 10*3/uL (ref 150–400)
RBC: 5.54 MIL/uL (ref 4.22–5.81)
RDW: 19.8 % — ABNORMAL HIGH (ref 11.5–15.5)
WBC: 13.9 10*3/uL — ABNORMAL HIGH (ref 4.0–10.5)
nRBC: 0 % (ref 0.0–0.2)

## 2021-11-19 LAB — PROTIME-INR
INR: 1.5 — ABNORMAL HIGH (ref 0.8–1.2)
Prothrombin Time: 18.5 seconds — ABNORMAL HIGH (ref 11.4–15.2)

## 2021-11-19 LAB — COMPREHENSIVE METABOLIC PANEL
ALT: 875 U/L — ABNORMAL HIGH (ref 0–44)
AST: 298 U/L — ABNORMAL HIGH (ref 15–41)
Albumin: 2.5 g/dL — ABNORMAL LOW (ref 3.5–5.0)
Alkaline Phosphatase: 92 U/L (ref 38–126)
Anion gap: 9 (ref 5–15)
BUN: 29 mg/dL — ABNORMAL HIGH (ref 6–20)
CO2: 28 mmol/L (ref 22–32)
Calcium: 8.4 mg/dL — ABNORMAL LOW (ref 8.9–10.3)
Chloride: 105 mmol/L (ref 98–111)
Creatinine, Ser: 1.18 mg/dL (ref 0.61–1.24)
GFR, Estimated: >60 mL/min (ref >60)
Glucose, Bld: 78 mg/dL (ref 70–99)
Potassium: 4.2 mmol/L (ref 3.5–5.1)
Sodium: 142 mmol/L (ref 135–145)
Total Bilirubin: 1.2 mg/dL (ref 0.3–1.2)
Total Protein: 5.7 g/dL — ABNORMAL LOW (ref 6.5–8.1)

## 2021-11-19 LAB — GLUCOSE, CAPILLARY
Glucose-Capillary: 108 mg/dL — ABNORMAL HIGH (ref 70–99)
Glucose-Capillary: 124 mg/dL — ABNORMAL HIGH (ref 70–99)
Glucose-Capillary: 130 mg/dL — ABNORMAL HIGH (ref 70–99)

## 2021-11-19 LAB — HCV RNA QUANT
HCV Quantitative Log: 5.358 log10 IU/mL (ref >1.70)
HCV Quantitative: 228000 IU/mL (ref >50)

## 2021-11-19 LAB — AMMONIA: Ammonia: 35 umol/L (ref 9–35)

## 2021-11-19 MED ORDER — SODIUM CHLORIDE 0.9 % IV SOLN: INTRAVENOUS | Status: AC

## 2021-11-19 MED ORDER — METHYLPREDNISOLONE SODIUM SUCC 125 MG IJ SOLR
60.0000 mg | Freq: Two times a day (BID) | INTRAMUSCULAR | Status: DC
  Administered 2021-11-19: 60 mg via INTRAVENOUS
  Filled 2021-11-19: qty 2

## 2021-11-19 MED ORDER — DEXMEDETOMIDINE HCL IN NACL 400 MCG/100ML IV SOLN
0.4000 ug/kg/h | INTRAVENOUS | Status: DC
  Administered 2021-11-19: 0.8 ug/kg/h via INTRAVENOUS
  Administered 2021-11-19 – 2021-11-20 (×2): 0.4 ug/kg/h via INTRAVENOUS
  Administered 2021-11-20: 0.5 ug/kg/h via INTRAVENOUS
  Administered 2021-11-21: 0.4 ug/kg/h via INTRAVENOUS
  Filled 2021-11-19 (×3): qty 100

## 2021-11-19 MED ORDER — FUROSEMIDE 10 MG/ML IJ SOLN
40.0000 mg | Freq: Once | INTRAMUSCULAR | Status: AC
  Administered 2021-11-19: 40 mg via INTRAVENOUS
  Filled 2021-11-19: qty 4

## 2021-11-19 MED ORDER — LORAZEPAM 2 MG/ML IJ SOLN
1.0000 mg | INTRAMUSCULAR | Status: AC | PRN
  Administered 2021-11-19: 4 mg via INTRAVENOUS
  Administered 2021-11-20: 2 mg via INTRAVENOUS
  Administered 2021-11-20: 4 mg via INTRAVENOUS
  Administered 2021-11-20 – 2021-11-21 (×4): 2 mg via INTRAVENOUS
  Administered 2021-11-21: 3 mg via INTRAVENOUS
  Administered 2021-11-22: 2 mg via INTRAVENOUS
  Filled 2021-11-19: qty 1
  Filled 2021-11-19 (×2): qty 2
  Filled 2021-11-19 (×3): qty 1
  Filled 2021-11-19: qty 2
  Filled 2021-11-19 (×3): qty 1

## 2021-11-19 MED ORDER — HALOPERIDOL LACTATE 5 MG/ML IJ SOLN
2.0000 mg | Freq: Four times a day (QID) | INTRAMUSCULAR | Status: DC | PRN
  Administered 2021-11-19: 2 mg via INTRAVENOUS
  Filled 2021-11-19: qty 1

## 2021-11-19 MED ORDER — BUDESONIDE 0.25 MG/2ML IN SUSP
0.2500 mg | Freq: Two times a day (BID) | RESPIRATORY_TRACT | Status: DC
  Administered 2021-11-22 – 2021-11-25 (×7): 0.25 mg via RESPIRATORY_TRACT
  Filled 2021-11-19 (×8): qty 2

## 2021-11-19 MED ORDER — METHYLPREDNISOLONE SODIUM SUCC 125 MG IJ SOLR: 125.0000 mg | Freq: Once | INTRAMUSCULAR | Status: DC

## 2021-11-19 MED ORDER — LORAZEPAM 1 MG PO TABS: 1.0000 mg | ORAL_TABLET | ORAL | Status: AC | PRN

## 2021-11-19 MED ORDER — LEVALBUTEROL HCL 0.63 MG/3ML IN NEBU
0.6300 mg | INHALATION_SOLUTION | Freq: Once | RESPIRATORY_TRACT | Status: AC
  Administered 2021-11-19: 0.63 mg via RESPIRATORY_TRACT
  Filled 2021-11-19: qty 3

## 2021-11-19 MED ORDER — HEPARIN SODIUM (PORCINE) 5000 UNIT/ML IJ SOLN
5000.0000 [IU] | Freq: Three times a day (TID) | INTRAMUSCULAR | Status: DC
  Administered 2021-11-19 – 2021-11-25 (×16): 5000 [IU] via SUBCUTANEOUS
  Filled 2021-11-19 (×16): qty 1

## 2021-11-19 MED ORDER — IPRATROPIUM-ALBUTEROL 0.5-2.5 (3) MG/3ML IN SOLN
3.0000 mL | RESPIRATORY_TRACT | Status: DC | PRN
  Administered 2021-11-24: 16:00:00 3 mL via RESPIRATORY_TRACT
  Filled 2021-11-19: qty 3

## 2021-11-19 MED ORDER — HYDRALAZINE HCL 20 MG/ML IJ SOLN
10.0000 mg | Freq: Four times a day (QID) | INTRAMUSCULAR | Status: DC | PRN
  Administered 2021-11-19: 10 mg via INTRAVENOUS
  Filled 2021-11-19: qty 1

## 2021-11-19 MED ORDER — HALOPERIDOL LACTATE 5 MG/ML IJ SOLN
2.0000 mg | INTRAMUSCULAR | Status: DC | PRN
  Administered 2021-11-19 – 2021-11-21 (×3): 2 mg via INTRAVENOUS
  Filled 2021-11-19 (×5): qty 1

## 2021-11-19 MED ORDER — METHADONE HCL 10 MG PO TABS
20.0000 mg | ORAL_TABLET | Freq: Two times a day (BID) | ORAL | Status: DC
  Administered 2021-11-19: 20 mg via ORAL
  Filled 2021-11-19: qty 2

## 2021-11-19 NOTE — Progress Notes (Addendum)
Progress Note    Imanuel Pruiett Reese   [id number]  DOB: August 17, 1971  DOA: 11/16/2021     2 Date of Service: 11/19/2021      Brief summary:Ernest Reese is a 50 y.o. M with opiate dependence, hx IVDA, COPD not on O2, CKD IIIa, and hep s/p streatment who presented for sluggishness, confusion, hypoxia. Friend activated EMS because Ernest Reese was altered.  In the ER, he was hypoxic to 80s, RR 12, CT showed basilar lung infiltrates, Cr 4.24 from baseline 1.2 and LFTs >4000.     11/21: Admitted 11/22: Remains very encephalopathic, delirious   Assessment and Plan  Severe sepsis with acute organ dysfunction [facility]) Ernest Reese presented with HR > 90, WBC 16K, elevated lactic acid, encephalopathy, renal failure and liver injury in setting of pneumonia and procal 14. -Suspect aspiration pneumonia in setting of encephalopathy or vice versa -Continue Rocephin and Flagyl, discontinue azithromycin -Blood cultures are negative, COVID and influenza PCR negative   Acute metabolic encephalopathy -Likely multifactorial, combination of sepsis, liver failure, renal failure with decrease gabapentin clearance and component of withdrawal  -No clear history of alcohol abuse.  Drug screen, negative alcohol use screens during previous admissions as well, ammonia level was normal -CT head was unremarkable  -Also suspect a component of methadone withdrawal, per chart review had been on methadone 100 mg daily, restart methadone at low-dose and titrate up gradually -As needed Haldol, restraints as needed -Continue thiamine and folate -daughter suspects, he is still using heroin and other illicit substances in spite of ongoing methadone use   Shock liver LFTs improving again.  INR 2.1 on admission, also improving.   -Ammonia pretty low.  TBili normal.  I suspect this is mostly ischemic given the high level.  AST/ALT ratio was up before, but 4000 seems too high for alcohol alone.  - Trend CMP, INR   Acute renal failure  superimposed on stage 3a chronic kidney disease (HCC)  -Likely ischemic ATN, improving, creatinine has normalized  -Continue IV fluids while p.o. intake is poor, renal ultrasound w/ bladder distention, now with Foley   Bladder obstruction Bladder distended on imaging.  Foley placed on admission - Continue new Flomax - Voiding trial when awake   COPD (chronic obstructive pulmonary disease) (HCC) Respiratory failure ruled out, he is not hypoxic or requiring oxygen   Atherosclerosis of aorta ([place])  Should be on aspirin, statin   History of hepatitis C Acute hepatitis panel noted for hep C Ab positive, as would be.   Polysubstance abuse ([place]) -Prior history of heroin use  DVT prophylaxis: SCDs CODE STATUS: Full code Family communication: No family at bedside, called and updated daughter Disposition, home versus SNF to be determined pending improvement in mental status   Subjective:  -Agitated yesterday evening, required Ativan and Haldol, fast asleep this morning in four-point restraints  Objective Vitals:   11/18/21 2100 11/18/21 2336 11/19/21 0430 11/19/21 0800  BP: (!) 149/88 (!) 130/91 133/82 128/81  Pulse: 62 64 87 61  Resp: 20  20 19   Temp: 98 F (36.7 C)  98.3 F (36.8 C) 98 F (36.7 C)  TempSrc: Oral  Oral Oral  SpO2: 100% 100% 100% 99%  Weight:      Height:       68.2 kg  Vital signs were reviewed and unremarkable.   Exam General appearance: Thin adult male, sitting up in bed, restrained, agitated, disheveled, does not open the eyes     HEENT: Eyes closed, refuses to open them,  edentulous, but does not open mouth exam, no nasal deformity or discharge Skin: No suspicious rashes or lesions Cardiac: RRR, no murmurs, no lower extremity edema Respiratory: Normal respiratory rate and rhythm, coarse upper airway sounds and some crackles bilaterally, good air movement, no wheezing Abdomen: No grimace to palpation, no guarding, no rigidity  MSK: Thin, no  deformities or effusions Neuro: Responds to sternal rub, states his name, makes no other answers, does not follow commands. Psych: Unable to assess    Labs / Other Information My review of labs, imaging, notes and other tests is significant for LFTs continuing to improve, creatinine down to 1.7, BUN 41, white blood cell count down, Fina 1.6, INR down to 1.7     Disposition Plan: Status is: Inpatient  Remains inpatient appropriate because: Admitted with severe sepsis.  He is still quite delirious.  My leading suspicion is that this is from gabapentin in the setting of his renal failure, although I expect this to resolve not his renal function is obviously improving.  It may also be alcohol withdrawal, although if he is not improving by tomorrow, would obtain MRI in consultation.        Time spent: 35 minutes [facility] 11/19/2021, 11:45 AM
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
## 2021-11-19 NOTE — Evaluation (Addendum)
Physical Therapy Evaluation Patient Details Name: Ernest Reese MRN: [id number] DOB: [birth year] Today's Date: 11/19/2021  History of Present Illness  Pt is a 50 y.o. who presents 11/16/2021 with severe sepsis with organ dysfunction, acute metabolic encephalopathy, shock liver, acute renal failure superimposed of stage 3a chronic kidney disease. Significant PMH: opiate dependence, hx IVDA, COPD, CKD IIIa.  Clinical Impression  Pt admitted with above. Pt asleep upon entry; able to arouse with mod stimulation; he continued to keep his eyes closed but demonstrates restless behaviors. Did not engage in functional task of washing face, but did drink apple juice when brought straw up to lips. Requiring mod-max assist (+2 safety) for bed mobility. Pt resistive at efforts to sit up on edge of bed and returning self to supine. SpO2 93% on 3L O2, 99 HR, RR 26-44, BP 142/90. Currently, recommending SNF at discharge. If pt mental status improves, may be able to progress home. Will continue to assess.      Recommendations for follow up therapy are one component of a multi-disciplinary discharge planning process, led by the attending physician.  Recommendations may be updated based on patient status, additional functional criteria and insurance authorization.  Follow Up Recommendations Skilled nursing-short term rehab (<3 hours/day)    Assistance Recommended at Discharge Frequent or constant Supervision/Assistance  Functional Status Assessment Patient has had a recent decline in their functional status and demonstrates the ability to make significant improvements in function in a reasonable and predictable amount of time.  Equipment Recommendations  Other (comment) (TBA)    Recommendations for Other Services       Precautions / Restrictions Precautions Precautions: Fall;Other (comment) Precaution Comments: 4 point restraints Restrictions Weight Bearing Restrictions: No      Mobility  Bed  Mobility Overal bed mobility: Needs Assistance Bed Mobility: Supine to Sit;Sit to Supine     Supine to sit: Mod assist;+2 for safety/equipment Sit to supine: Min assist;+2 for safety/equipment   General bed mobility comments: Pt in left sidelying, assist for initiation of BLE's off edge of bed and trunk to upright. Pt returning self to sidelying; resistive to sitting up    Transfers                   General transfer comment: deferred, as pt resistive, restless, and not keeping eyes open    Ambulation/Gait                  Stairs            Wheelchair Mobility    Modified Rankin (Stroke Patients Only)       Balance Overall balance assessment: Needs assistance Sitting-balance support: Feet supported Sitting balance-Leahy Scale: Poor Sitting balance - Comments: likely sitting balance is fair, however, pt heavily resistive to sitting up on edge of bed and frequently trying to lie back down                                     Pertinent Vitals/Pain Pain Assessment: Faces Faces Pain Scale: No hurt    Home Living Family/patient expects to be discharged to:: Private residence Living Arrangements: Other (Comment) (roommates) Available Help at Discharge: Friend(s) Type of Home: House Home Access: Stairs to enter Entrance Stairs-Rails: Right Entrance Stairs-Number of Steps: 1+1   Home Layout: One level Home Equipment: None Additional Comments: Information obtained from prior chart review    Prior Function Prior Level  of Function : Independent/Modified Independent                     Hand Dominance   Dominant Hand: Right    Extremity/Trunk Assessment   Upper Extremity Assessment Upper Extremity Assessment: RUE deficits/detail;LUE deficits/detail RUE Deficits / Details: Moving spontaneously LUE Deficits / Details: Moving spontaneously    Lower Extremity Assessment Lower Extremity Assessment: RLE deficits/detail;LLE  deficits/detail RLE Deficits / Details: Moving spontaneously LLE Deficits / Details: Moving spontaneously    Cervical / Trunk Assessment Cervical / Trunk Assessment: Normal  Communication   Communication: Other (comment) (nonverbal during session)  Cognition Arousal/Alertness: Lethargic Behavior During Therapy: Restless Overall Cognitive Status: Difficult to assess Area of Impairment: Following commands                       Following Commands: Follows one step commands inconsistently       General Comments: Pt keeping eyes closed, no verbalizations, is restless, but no comativeness noted. Would not engage in washing face with hand over hand facilitation or drinking water, however, did drink apple juice out of straw.        General Comments      Exercises     Assessment/Plan    PT Assessment Patient needs continued PT services  PT Problem List Decreased activity tolerance;Decreased balance;Decreased mobility;Decreased cognition;Decreased safety awareness       PT Treatment Interventions Gait training;Functional mobility training;Therapeutic exercise;Therapeutic activities;Balance training;Patient/family education    PT Goals (Current goals can be found in the Care Plan section)  Acute Rehab PT Goals Patient Stated Goal: pt unable PT Goal Formulation: Patient unable to participate in goal setting Time For Goal Achievement: 12/03/21 Potential to Achieve Goals: Good    Frequency Min 3X/week   Barriers to discharge        Co-evaluation               AM-PAC PT "6 Clicks" Mobility  Outcome Measure Help needed turning from your back to your side while in a flat bed without using bedrails?: A Lot Help needed moving from lying on your back to sitting on the side of a flat bed without using bedrails?: A Lot Help needed moving to and from a bed to a chair (including a wheelchair)?: Total Help needed standing up from a chair using your arms (e.g.,  wheelchair or bedside chair)?: Total Help needed to walk in hospital room?: Total Help needed climbing 3-5 steps with a railing? : Total 6 Click Score: 8    End of Session Equipment Utilized During Treatment: Oxygen Activity Tolerance: Patient limited by lethargy Patient left: in bed;with call bell/phone within reach;with bed alarm set;with restraints reapplied   PT Visit Diagnosis: Difficulty in walking, not elsewhere classified (R26.2)    Time: 7711-6579 PT Time Calculation (min) (ACUTE ONLY): 19 min   Charges:   PT Evaluation $PT Eval Moderate Complexity: 1 Mod          Wyona Almas, PT, DPT Acute Rehabilitation Services Pager [phone] Office [phone]   Deno Etienne 11/19/2021, 2:17 PM

## 2021-11-19 NOTE — Plan of Care (Signed)
  Problem: Safety: Goal: Non-violent Restraint(s) Outcome: Progressing   

## 2021-11-19 NOTE — Significant Event (Signed)
Rapid Response Event Note   Reason for Call :  Rapid HR  Initial Focused Assessment:  Patient very agitated currently in 4 point restraints.  He is moving all extremities but is is not interactive with staff.  BP 158/106  ST 147  RR 45-50  O2 sat 95% on 6L [place] axillary temp 98.0  Lung sounds with rhonchi, crackles Heart tones regular Anterior Right shoulder with draining wound No tenderness noted to abdominal palpation   Dr Broadus John at bedside to assess patient   Interventions:  NT suctioned,  no secretions, bloody nare Xopenex inhaler 40 mg Lasix IV IVF - KVO ABG done PCXR ordered  CCM at bedside to assess patient Precedex gtt started 0.51mcg Breathing much easier when he is clam   Transported to 3M13 Increased agitation on arrival, precedex gtt increased to 0.39mcg  Plan of Care:     Event Summary:   MD Notified: Dr Broadus John Call Time: [place] Time: 1620 End Time: [place]  Raliegh Ip, RN

## 2021-11-19 NOTE — Progress Notes (Signed)
Report called to Lattie Haw RN at [facility] and patient transported to [facility] via bed.

## 2021-11-19 NOTE — Progress Notes (Signed)
   11/19/21 1517  Assess: MEWS Score  Temp 98.2 F (36.8 C)  BP (!) 161/108  Pulse Rate 93  Resp (!) 30  SpO2 96 %  O2 Device Nasal Cannula  O2 Flow Rate (L/min) 3 L/min  Assess: MEWS Score  MEWS Temp 0  MEWS Systolic 0  MEWS Pulse 0  MEWS RR 2  MEWS LOC 0  MEWS Score 2  MEWS Score Color Yellow  Assess: if the MEWS score is Yellow or Red  Were vital signs taken at a resting state? No  Focused Assessment No change from prior assessment  Early Detection of Sepsis Score *See Row Information* Low  MEWS guidelines implemented *See Row Information* No, vital signs rechecked  Treat  MEWS Interventions Other (Comment) (MD notified for orders)  Pain Scale 0-10  Pain Score 0  Breathing 0  Facial Expression 0  Body Language 1  Consolability 1  Complains of Agitation  Take Vital Signs  Increase Vital Sign Frequency  Yellow: Q 2hr X 2 then Q 4hr X 2, if remains yellow, continue Q 4hrs  Notify: Provider  Provider Name/Title Domenic Polite, MD  Date Provider Notified 11/19/21  Time Provider Notified 1525  Notification Type Page (secure chat)  Notification Reason Other (Comment) (Yellow mews)  Provider response See new orders  Date of Provider Response 11/19/21  Time of Provider Response 1526  Patient agitated at this time. MD made aware. New orders received. Will follow orders and contd to monitor.

## 2021-11-20 DIAGNOSIS — J690 Pneumonitis due to inhalation of food and vomit: Secondary | ICD-10-CM

## 2021-11-20 DIAGNOSIS — A419 Sepsis, unspecified organism: Secondary | ICD-10-CM | POA: Diagnosis not present

## 2021-11-20 DIAGNOSIS — N179 Acute kidney failure, unspecified: Secondary | ICD-10-CM | POA: Diagnosis not present

## 2021-11-20 DIAGNOSIS — G9341 Metabolic encephalopathy: Secondary | ICD-10-CM

## 2021-11-20 DIAGNOSIS — N1831 Chronic kidney disease, stage 3a: Secondary | ICD-10-CM

## 2021-11-20 LAB — COMPREHENSIVE METABOLIC PANEL
ALT: 607 U/L — ABNORMAL HIGH (ref 0–44)
AST: 97 U/L — ABNORMAL HIGH (ref 15–41)
Albumin: 2.8 g/dL — ABNORMAL LOW (ref 3.5–5.0)
Alkaline Phosphatase: 90 U/L (ref 38–126)
Anion gap: 12 (ref 5–15)
BUN: 31 mg/dL — ABNORMAL HIGH (ref 6–20)
CO2: 29 mmol/L (ref 22–32)
Calcium: 8.6 mg/dL — ABNORMAL LOW (ref 8.9–10.3)
Chloride: 101 mmol/L (ref 98–111)
Creatinine, Ser: 1.17 mg/dL (ref 0.61–1.24)
GFR, Estimated: >60 mL/min (ref >60)
Glucose, Bld: 166 mg/dL — ABNORMAL HIGH (ref 70–99)
Potassium: 4 mmol/L (ref 3.5–5.1)
Sodium: 142 mmol/L (ref 135–145)
Total Bilirubin: 1.4 mg/dL — ABNORMAL HIGH (ref 0.3–1.2)
Total Protein: 6.6 g/dL (ref 6.5–8.1)

## 2021-11-20 LAB — CBC
HCT: 46.5 % (ref 39.0–52.0)
Hemoglobin: 14.2 g/dL (ref 13.0–17.0)
MCH: 23.5 pg — ABNORMAL LOW (ref 26.0–34.0)
MCHC: 30.5 g/dL (ref 30.0–36.0)
MCV: 77.1 fL — ABNORMAL LOW (ref 80.0–100.0)
Platelets: 189 10*3/uL (ref 150–400)
RBC: 6.03 MIL/uL — ABNORMAL HIGH (ref 4.22–5.81)
RDW: 20 % — ABNORMAL HIGH (ref 11.5–15.5)
WBC: 5.8 10*3/uL (ref 4.0–10.5)
nRBC: 0 % (ref 0.0–0.2)

## 2021-11-20 LAB — GLUCOSE, CAPILLARY
Glucose-Capillary: 138 mg/dL — ABNORMAL HIGH (ref 70–99)
Glucose-Capillary: 164 mg/dL — ABNORMAL HIGH (ref 70–99)
Glucose-Capillary: 171 mg/dL — ABNORMAL HIGH (ref 70–99)
Glucose-Capillary: 159 mg/dL — ABNORMAL HIGH (ref 70–99)
Glucose-Capillary: 155 mg/dL — ABNORMAL HIGH (ref 70–99)
Glucose-Capillary: 146 mg/dL — ABNORMAL HIGH (ref 70–99)

## 2021-11-20 LAB — PROTIME-INR
INR: 1.6 — ABNORMAL HIGH (ref 0.8–1.2)
Prothrombin Time: 19.4 seconds — ABNORMAL HIGH (ref 11.4–15.2)

## 2021-11-20 MED ORDER — FOLIC ACID 5 MG/ML IJ SOLN
1.0000 mg | Freq: Every day | INTRAMUSCULAR | Status: DC
  Administered 2021-11-20 – 2021-11-23 (×4): 1 mg via INTRAVENOUS
  Filled 2021-11-20 (×5): qty 0.2

## 2021-11-20 MED ORDER — FENTANYL CITRATE (PF) 100 MCG/2ML IJ SOLN
25.0000 ug | INTRAMUSCULAR | Status: DC | PRN
  Administered 2021-11-20 – 2021-11-22 (×7): 50 ug via INTRAVENOUS
  Filled 2021-11-20 (×9): qty 2
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
## 2021-11-20 NOTE — Progress Notes (Signed)
CSW acknowledging consult for substance abuse counseling. However, patient currently disoriented and in restraints; not appropriate at this time. Please reconsult CSW when patient is able to participate in assessment.   Laveda Abbe, [place] Clinical Social Worker [phone]

## 2021-11-21 DIAGNOSIS — A419 Sepsis, unspecified organism: Secondary | ICD-10-CM | POA: Diagnosis not present

## 2021-11-21 DIAGNOSIS — R6520 Severe sepsis without septic shock: Secondary | ICD-10-CM | POA: Diagnosis not present

## 2021-11-21 LAB — GLUCOSE, CAPILLARY
Glucose-Capillary: 129 mg/dL — ABNORMAL HIGH (ref 70–99)
Glucose-Capillary: 127 mg/dL — ABNORMAL HIGH (ref 70–99)
Glucose-Capillary: 109 mg/dL — ABNORMAL HIGH (ref 70–99)
Glucose-Capillary: 120 mg/dL — ABNORMAL HIGH (ref 70–99)
Glucose-Capillary: 96 mg/dL (ref 70–99)

## 2021-11-21 LAB — COMPREHENSIVE METABOLIC PANEL
ALT: 470 U/L — ABNORMAL HIGH (ref 0–44)
AST: 55 U/L — ABNORMAL HIGH (ref 15–41)
Albumin: 2.9 g/dL — ABNORMAL LOW (ref 3.5–5.0)
Alkaline Phosphatase: 89 U/L (ref 38–126)
Anion gap: 10 (ref 5–15)
BUN: 53 mg/dL — ABNORMAL HIGH (ref 6–20)
CO2: 29 mmol/L (ref 22–32)
Calcium: 9 mg/dL (ref 8.9–10.3)
Chloride: 107 mmol/L (ref 98–111)
Creatinine, Ser: 1.12 mg/dL (ref 0.61–1.24)
GFR, Estimated: >60 mL/min (ref >60)
Glucose, Bld: 143 mg/dL — ABNORMAL HIGH (ref 70–99)
Potassium: 3.5 mmol/L (ref 3.5–5.1)
Sodium: 146 mmol/L — ABNORMAL HIGH (ref 135–145)
Total Bilirubin: 1.1 mg/dL (ref 0.3–1.2)
Total Protein: 6.7 g/dL (ref 6.5–8.1)

## 2021-11-21 LAB — PROTIME-INR
INR: 1.7 — ABNORMAL HIGH (ref 0.8–1.2)
Prothrombin Time: 19.6 seconds — ABNORMAL HIGH (ref 11.4–15.2)

## 2021-11-21 MED ORDER — GABAPENTIN 300 MG PO CAPS
300.0000 mg | ORAL_CAPSULE | Freq: Three times a day (TID) | ORAL | Status: DC
Start: 2021-11-21 — End: 2021-11-24
  Administered 2021-11-22 – 2021-11-24 (×7): 300 mg via ORAL
  Filled 2021-11-21 (×7): qty 1

## 2021-11-21 MED ORDER — OXYCODONE HCL 5 MG PO TABS: 5.0000 mg | ORAL_TABLET | ORAL | Status: DC | PRN

## 2021-11-21 MED ORDER — METOPROLOL TARTRATE 5 MG/5ML IV SOLN
INTRAVENOUS | Status: AC
  Administered 2021-11-21: 5 mg via INTRAVENOUS
  Filled 2021-11-21: qty 5

## 2021-11-21 MED ORDER — DOCUSATE SODIUM 100 MG PO CAPS
100.0000 mg | ORAL_CAPSULE | Freq: Two times a day (BID) | ORAL | Status: DC | PRN
  Administered 2021-11-21: 100 mg via ORAL
  Filled 2021-11-21: qty 1

## 2021-11-21 MED ORDER — FENTANYL CITRATE (PF) 100 MCG/2ML IJ SOLN
25.0000 ug | Freq: Once | INTRAMUSCULAR | Status: AC
  Administered 2021-11-21: 25 ug via INTRAVENOUS
  Filled 2021-11-21: qty 2

## 2021-11-21 MED ORDER — GABAPENTIN 250 MG/5ML PO SOLN
300.0000 mg | Freq: Three times a day (TID) | ORAL | Status: DC
  Filled 2021-11-21: qty 6

## 2021-11-21 MED ORDER — BISACODYL 10 MG RE SUPP: 10.0000 mg | Freq: Every day | RECTAL | Status: DC | PRN

## 2021-11-21 MED ORDER — METOPROLOL TARTRATE 5 MG/5ML IV SOLN: 5.0000 mg | Freq: Once | INTRAVENOUS | Status: AC

## 2021-11-21 NOTE — Progress Notes (Signed)
[facility] Progress Note Patient Name: Ernest Reese DOB: 09-Oct-1971 MRN: [id number]   Date of Service  11/21/2021  HPI/Events of Note  Constipation  eICU Interventions  Plan: Colace 100 mg PO BID PRN mild constipation. Dulcolax Suppository 10 mg PR Q day PRN moderate constipation.      Intervention Category Major Interventions: Other:  Lysle Dingwall 11/21/2021, 10:19 PM
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
## 2021-11-21 NOTE — Progress Notes (Signed)
PT Cancellation Note  Patient Details Name: Ernest Reese MRN: [id number] DOB: 1971-09-29   Cancelled Treatment:    Reason Eval/Treat Not Completed: Patient not medically ready.  Hold per RN, pt is agitated, HR rising. 11/21/2021  Ernest Carne., PT Acute Rehabilitation Services [phone]  (pager) [phone]  (office)   Ernest Reese 11/21/2021, 1:58 PM

## 2021-11-21 NOTE — Progress Notes (Addendum)
eLink Physician-Brief Progress Note Patient Name: Ernest Reese DOB: November 21, 1971 MRN: [id number]   Date of Service  11/21/2021  HPI/Events of Note  Severe agitated. Seen on camera. Was on precedex but even at just 0.5 mic, dropped HR to 30. RN had given fentanyl last at around 1 am, haldol at 10 pm. Otherwise BP , RR stable   eICU Interventions  Giving haldol now One time additional dose of fentanyl ordered to be tried after haldol After 30 min or so, will resume precedex at just 0.2 mic and see how he does If all fail, will need to use the ativan that is ordered     Intervention Category Major Interventions: Delirium, psychosis, severe agitation - evaluation and management  [name] G Willa Brocks 11/21/2021, 2:08 AM  Addendum at 5 am Have seen him on camera a couple times Hr 55. MAP 85. O2 97. RR 18-19. Needed that dose of ativan . Currently calm, asleep.

## 2021-11-22 DIAGNOSIS — J690 Pneumonitis due to inhalation of food and vomit: Secondary | ICD-10-CM | POA: Diagnosis not present

## 2021-11-22 DIAGNOSIS — G9341 Metabolic encephalopathy: Secondary | ICD-10-CM | POA: Diagnosis not present

## 2021-11-22 DIAGNOSIS — F1910 Other psychoactive substance abuse, uncomplicated: Secondary | ICD-10-CM

## 2021-11-22 DIAGNOSIS — N320 Bladder-neck obstruction: Secondary | ICD-10-CM

## 2021-11-22 DIAGNOSIS — A419 Sepsis, unspecified organism: Secondary | ICD-10-CM | POA: Diagnosis not present

## 2021-11-22 DIAGNOSIS — N179 Acute kidney failure, unspecified: Secondary | ICD-10-CM | POA: Diagnosis not present

## 2021-11-22 DIAGNOSIS — Z8619 Personal history of other infectious and parasitic diseases: Secondary | ICD-10-CM

## 2021-11-22 DIAGNOSIS — K7200 Acute and subacute hepatic failure without coma: Secondary | ICD-10-CM

## 2021-11-22 LAB — COMPREHENSIVE METABOLIC PANEL
ALT: 278 U/L — ABNORMAL HIGH (ref 0–44)
AST: 42 U/L — ABNORMAL HIGH (ref 15–41)
Albumin: 2.9 g/dL — ABNORMAL LOW (ref 3.5–5.0)
Alkaline Phosphatase: 77 U/L (ref 38–126)
Anion gap: 8 (ref 5–15)
BUN: 45 mg/dL — ABNORMAL HIGH (ref 6–20)
CO2: 28 mmol/L (ref 22–32)
Calcium: 8.8 mg/dL — ABNORMAL LOW (ref 8.9–10.3)
Chloride: 111 mmol/L (ref 98–111)
Creatinine, Ser: 1.13 mg/dL (ref 0.61–1.24)
GFR, Estimated: >60 mL/min (ref >60)
Glucose, Bld: 105 mg/dL — ABNORMAL HIGH (ref 70–99)
Potassium: 3.7 mmol/L (ref 3.5–5.1)
Sodium: 147 mmol/L — ABNORMAL HIGH (ref 135–145)
Total Bilirubin: 1.2 mg/dL (ref 0.3–1.2)
Total Protein: 6.5 g/dL (ref 6.5–8.1)

## 2021-11-22 LAB — CULTURE, BLOOD (SINGLE): Culture: NO GROWTH

## 2021-11-22 LAB — PROTIME-INR
INR: 1.4 — ABNORMAL HIGH (ref 0.8–1.2)
Prothrombin Time: 17.5 seconds — ABNORMAL HIGH (ref 11.4–15.2)

## 2021-11-22 MED ORDER — METHADONE HCL 10 MG/ML PO CONC
50.0000 mg | Freq: Every day | ORAL | Status: DC
  Administered 2021-11-22 – 2021-11-24 (×3): 50 mg via ORAL
  Filled 2021-11-22 (×3): qty 5

## 2021-11-22 MED ORDER — METHADONE HCL 10 MG/ML PO CONC: 115.0000 mg | Freq: Every day | ORAL | Status: DC

## 2021-11-22 MED ORDER — FENTANYL CITRATE (PF) 100 MCG/2ML IJ SOLN: 25.0000 ug | INTRAMUSCULAR | Status: DC | PRN

## 2021-11-22 NOTE — Progress Notes (Addendum)
PROGRESS NOTE  Ernest Reese NLG:[id number] DOB: 13-May-1971 DOA: 11/16/2021 PCP: Marzetta Board, NP   LOS: 5 days   Brief narrative: Patient is a 50 years old male with past medical history of COPD, CKD stage IIIa, hepatitis C, history of opiate dependence in the past, presented to the hospital with confusion and hypoxia with right lower lobe pneumonia.  Patient was admitted to the ICU on 11/23 for worsening confusion requiring Precedex drip.  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  CT scan of the abdomen pelvis showed bibasilar infiltrate and wert.  Bladder distention with hydronephrosis.  LFTs were elevated.  Patient was given 2 and half liters of Ringer lactate Rocephin, Zithromax and Narcan in the ED and he acutely became agitated after Narcan was given.  Assessment/Plan:  Principal Problem:   Severe sepsis with acute organ dysfunction (HCC) Active Problems:   Polysubstance abuse ([place])   Atherosclerosis of aorta (HCC)   COPD (chronic obstructive pulmonary disease) (HCC)   Acute renal failure superimposed on stage 3a chronic kidney disease (HCC)   History of hepatitis C   Opiate overdose ([place])   Aspiration pneumonia ([place])   Ischemic hepatitis   Acute metabolic encephalopathy   Bladder obstruction  Acute metabolic encephalopathy , concern for opiate withdrawal.  Patient takes methadone as outpatient.  Has history of oxycodone abuse.  Will resume methadone at a lower dose today.  Patient is currently on fentanyl IV Ativan due to agitation.Patient was on Precedex which has been taken off.   Continue thiamine and folic acid.  Aim is to go back on methadone 115 mg daily with cut back on fentanyl.   COPD exacerbation with history of COPD and hypoxic respiratory failure secondary to COPD exacerbation and pneumonia.   Improved.  Continue as needed albuterol at this time.  Continue supplemental oxygen.  Continue to wean as able.  Severe sepsis due to RLL aspiration  pneumonia Continue Rocephin to complete 5-day course.  Blood cultures negative in 4 days.   Shock liver in setting of prior treated hepatitis C Has improved.  Monitor CMP.   Mild hypernatremia.  Sodium of 147.  We will continue to monitor closely.  Acute on CKD 3a  Improving.  Latest creatinine of 1.1.  Continue to monitor intake and output charting.    Bladder obstruction likely secondary to BPH. Flomax, was on Foley catheter which has been removed.  We will continue to monitor intake and output charting closely.   Atherosclerosis of aorta  Will benefit from statins.  Need follow-up with PCP on discharge.  DVT prophylaxis: heparin injection 5,000 Units Start: 11/19/21 2200   Code Status: Full code  Family Communication: Spoke with the patient's family at bedside.  Status is: Inpatient  Remains inpatient appropriate because: Hypoxic respiratory failure, agitation , pneumonia, metabolic encephalopathy, opiate withdrawal  Consultants: PCCM  Procedures: None  Anti-infectives: None currently  Anti-infectives (From admission, onward)    Start     Dose/Rate Route Frequency Ordered Stop   11/17/21 0230  metroNIDAZOLE (FLAGYL) IVPB 500 mg  Status:  Discontinued        500 mg 100 mL/hr over 60 Minutes Intravenous Every 12 hours 11/17/21 0227 11/20/21 1015   11/16/21 2300  cefTRIAXone (ROCEPHIN) 2 g in sodium chloride 0.9 % 100 mL IVPB        2 g 200 mL/hr over 30 Minutes Intravenous Every 24 hours 11/16/21 2249 11/21/21 0058   11/16/21 2300  azithromycin (ZITHROMAX) 500 mg  in sodium chloride 0.9 % 250 mL IVPB  Status:  Discontinued        500 mg 250 mL/hr over 60 Minutes Intravenous Every 24 hours 11/16/21 2249 11/19/21 0912       Subjective: Today, patient was seen and examined at bedside.  Appears to be drowsy at times but mildly verbal.  Family at bedside.  Denies overt pain at this time.  Objective: Vitals:   11/22/21 0739 11/22/21 0745  BP:    Pulse:    Resp:     Temp:  99.1 F (37.3 C)  SpO2: 94%     Intake/Output Summary (Last 24 hours) at 11/22/2021 0834 Last data filed at 11/22/2021 0600 Gross per 24 hour  Intake 79.92 ml  Output 735 ml  Net -655.08 ml   Filed Weights   11/20/21 0500 11/21/21 0117 11/22/21 0213  Weight: 65 kg 64.2 kg 63.7 kg   Body mass index is 19.59 kg/m.   Physical Exam: GENERAL: .  Thinly built.  Somnolent at times.  Still able to verbalize some HENT: No scleral pallor or icterus. Pupils equally reactive to light. Oral mucosa is moist NECK: is supple, no gross swelling noted. CHEST: Coarse breath sounds noted, diminished breath sounds bilaterally. CVS: S1 and S2 heard, no murmur. Regular rate and rhythm.  ABDOMEN: Soft, non-tender, bowel sounds are present. EXTREMITIES: No edema.  Abrasion to the lower left lower extremity CNS: Cranial nerves are intact. No focal motor deficits. SKIN: warm and dry without rashes.  Data Review: I have personally reviewed the following laboratory data and studies,  CBC: Recent Labs  Lab 11/16/21 2144 11/17/21 0405 11/17/21 0415 11/18/21 0200 11/19/21 0253 11/20/21 0558  WBC 16.3* 15.5*  --  12.9* 13.9* 5.8  NEUTROABS 14.5*  --   --   --   --   --   HGB 14.8 12.8* 13.3 13.4 13.1 14.2  HCT 49.2 42.5 39.0 44.2 43.4 46.5  MCV 80.4 79.0*  --  78.5* 78.3* 77.1*  PLT 249 224  --  187 165 193   Basic Metabolic Panel: Recent Labs  Lab 11/17/21 0405 11/17/21 0415 11/18/21 0200 11/19/21 0253 11/20/21 0558 11/21/21 0232 11/22/21 0345  NA 143   < > 143 142 142 146* 147*  K 4.3   < > 4.2 4.2 4.0 3.5 3.7  CL 103  --  105 105 101 107 111  CO2 28  --  31 28 29 29 28   GLUCOSE 128*  --  89 78 166* 143* 105*  BUN 49*  --  41* 29* 31* 53* 45*  CREATININE 3.23*  --  1.71* 1.18 1.17 1.12 1.13  CALCIUM 8.3*  --  8.3* 8.4* 8.6* 9.0 8.8*  MG 2.3  --   --   --   --   --   --   PHOS 3.0  --   --   --   --   --   --    < > = values in this interval not displayed.   Liver  Function Tests: Recent Labs  Lab 11/18/21 0200 11/19/21 0253 11/20/21 0558 11/21/21 0232 11/22/21 0345  AST 795* 298* 97* 55* 42*  ALT 1,297* 875* 607* 470* 278*  ALKPHOS 95 92 90 89 77  BILITOT 0.8 1.2 1.4* 1.1 1.2  PROT 5.7* 5.7* 6.6 6.7 6.5  ALBUMIN 2.4* 2.5* 2.8* 2.9* 2.9*   No results for input(s): LIPASE, AMYLASE in the last 168 hours. Recent Labs  Lab 11/17/21  0405 11/18/21 0200 11/19/21 0253  AMMONIA 42* 32 35   Cardiac Enzymes: No results for input(s): CKTOTAL, CKMB, CKMBINDEX, TROPONINI in the last 168 hours. BNP (last 3 results) Recent Labs    02/24/21 1035 06/23/21 1850  BNP 305.6* 47.0    ProBNP (last 3 results) No results for input(s): PROBNP in the last 8760 hours.  CBG: Recent Labs  Lab 11/21/21 0333 11/21/21 0711 11/21/21 1138 11/21/21 1505 11/21/21 1958  GLUCAP 129* 127* 109* 120* 96   Recent Results (from the past 240 hour(s))  Resp Panel by RT-PCR (Flu A&B, Covid) Nasopharyngeal Swab     Status: None   Collection Time: 11/17/21  2:33 AM   Specimen: Nasopharyngeal Swab; Nasopharyngeal(NP) swabs in vial transport medium  Result Value Ref Range Status   SARS Coronavirus 2 by RT PCR NEGATIVE NEGATIVE Final    Comment: (NOTE) SARS-CoV-2 target nucleic acids are NOT DETECTED.  The SARS-CoV-2 RNA is generally detectable in upper respiratory specimens during the acute phase of infection. The lowest concentration of SARS-CoV-2 viral copies this assay can detect is 138 copies/mL. A negative result does not preclude SARS-Cov-2 infection and should not be used as the sole basis for treatment or other patient management decisions. A negative result may occur with  improper specimen collection/handling, submission of specimen other than nasopharyngeal swab, presence of viral mutation(s) within the areas targeted by this assay, and inadequate number of viral copies(<138 copies/mL). A negative result must be combined with clinical observations,  patient history, and epidemiological information. The expected result is Negative.  Fact Sheet for Patients:  EntrepreneurPulse.com.au  Fact Sheet for Healthcare Providers:  IncredibleEmployment.be  This test is no t yet approved or cleared by the [place] FDA and  has been authorized for detection and/or diagnosis of SARS-CoV-2 by FDA under an Emergency Use Authorization (EUA). This EUA will remain  in effect (meaning this test can be used) for the duration of the COVID-19 declaration under Section 564(b)(1) of the Act, 21 U.S.C.section 360bbb-3(b)(1), unless the authorization is terminated  or revoked sooner.       Influenza A by PCR NEGATIVE NEGATIVE Final   Influenza B by PCR NEGATIVE NEGATIVE Final    Comment: (NOTE) The Xpert Xpress SARS-CoV-2/FLU/RSV plus assay is intended as an aid in the diagnosis of influenza from Nasopharyngeal swab specimens and should not be used as a sole basis for treatment. Nasal washings and aspirates are unacceptable for Xpert Xpress SARS-CoV-2/FLU/RSV testing.  Fact Sheet for Patients: EntrepreneurPulse.com.au  Fact Sheet for Healthcare Providers: IncredibleEmployment.be  This test is not yet approved or cleared by the [place] FDA and has been authorized for detection and/or diagnosis of SARS-CoV-2 by FDA under an Emergency Use Authorization (EUA). This EUA will remain in effect (meaning this test can be used) for the duration of the COVID-19 declaration under Section 564(b)(1) of the Act, 21 U.S.C. section 360bbb-3(b)(1), unless the authorization is terminated or revoked.  Performed at [facility], [place] [address], [place], [place] [address]   Culture, blood (single)     Status: None (Preliminary result)   Collection Time: 11/17/21  4:05 AM   Specimen: BLOOD LEFT HAND  Result Value Ref Range Status   Specimen Description BLOOD LEFT HAND  Final    Special Requests   Final    AEROBIC BOTTLE ONLY Blood Culture results may not be optimal due to an inadequate volume of blood received in culture bottles   Culture   Final  NO GROWTH 4 DAYS Performed at [facility], [place] [address], [place], [place] [address]    Report Status PENDING  Incomplete  MRSA Next Gen by PCR, Nasal     Status: None   Collection Time: 11/19/21  5:44 PM   Specimen: Nasal Mucosa; Nasal Swab  Result Value Ref Range Status   MRSA by PCR Next Gen NOT DETECTED NOT DETECTED Final    Comment: (NOTE) The GeneXpert MRSA Assay (FDA approved for NASAL specimens only), is one component of a comprehensive MRSA colonization surveillance program. It is not intended to diagnose MRSA infection nor to guide or monitor treatment for MRSA infections. Test performance is not FDA approved in patients less than 8 years old. Performed at [facility], [place] [address], [place], [place] [address]      Studies: No results found.    Flora Lipps, MD  [facility] 11/22/2021  If 7PM-7AM, please contact night-coverage

## 2021-11-23 DIAGNOSIS — N179 Acute kidney failure, unspecified: Secondary | ICD-10-CM | POA: Diagnosis not present

## 2021-11-23 DIAGNOSIS — J690 Pneumonitis due to inhalation of food and vomit: Secondary | ICD-10-CM | POA: Diagnosis not present

## 2021-11-23 DIAGNOSIS — G9341 Metabolic encephalopathy: Secondary | ICD-10-CM | POA: Diagnosis not present

## 2021-11-23 DIAGNOSIS — A419 Sepsis, unspecified organism: Secondary | ICD-10-CM | POA: Diagnosis not present

## 2021-11-23 LAB — PROTIME-INR
INR: 1.4 — ABNORMAL HIGH (ref 0.8–1.2)
Prothrombin Time: 17.2 seconds — ABNORMAL HIGH (ref 11.4–15.2)

## 2021-11-23 LAB — COMPREHENSIVE METABOLIC PANEL
ALT: 197 U/L — ABNORMAL HIGH (ref 0–44)
AST: 39 U/L (ref 15–41)
Albumin: 2.8 g/dL — ABNORMAL LOW (ref 3.5–5.0)
Alkaline Phosphatase: 70 U/L (ref 38–126)
Anion gap: 8 (ref 5–15)
BUN: 30 mg/dL — ABNORMAL HIGH (ref 6–20)
CO2: 26 mmol/L (ref 22–32)
Calcium: 8.7 mg/dL — ABNORMAL LOW (ref 8.9–10.3)
Chloride: 109 mmol/L (ref 98–111)
Creatinine, Ser: 0.94 mg/dL (ref 0.61–1.24)
GFR, Estimated: >60 mL/min (ref >60)
Glucose, Bld: 83 mg/dL (ref 70–99)
Potassium: 3.8 mmol/L (ref 3.5–5.1)
Sodium: 143 mmol/L (ref 135–145)
Total Bilirubin: 1.1 mg/dL (ref 0.3–1.2)
Total Protein: 6.1 g/dL — ABNORMAL LOW (ref 6.5–8.1)

## 2021-11-23 LAB — CBC
HCT: 47 % (ref 39.0–52.0)
Hemoglobin: 14.1 g/dL (ref 13.0–17.0)
MCH: 23.7 pg — ABNORMAL LOW (ref 26.0–34.0)
MCHC: 30 g/dL (ref 30.0–36.0)
MCV: 78.9 fL — ABNORMAL LOW (ref 80.0–100.0)
Platelets: 156 10*3/uL (ref 150–400)
RBC: 5.96 MIL/uL — ABNORMAL HIGH (ref 4.22–5.81)
RDW: 20 % — ABNORMAL HIGH (ref 11.5–15.5)
WBC: 7.1 10*3/uL (ref 4.0–10.5)
nRBC: 0 % (ref 0.0–0.2)

## 2021-11-23 LAB — MAGNESIUM: Magnesium: 2 mg/dL (ref 1.7–2.4)

## 2021-11-23 LAB — PHOSPHORUS: Phosphorus: 3.7 mg/dL (ref 2.5–4.6)

## 2021-11-23 NOTE — Assessment & Plan Note (Addendum)
Resolved -Continue Flomax, no need to continue on d/c

## 2021-11-23 NOTE — Progress Notes (Signed)
Pt's daughter Vickii Chafe notified about pt's transfer to [phone].

## 2021-11-23 NOTE — Assessment & Plan Note (Signed)
Resolved

## 2021-11-23 NOTE — Assessment & Plan Note (Signed)
-  Continue methadone 

## 2021-11-23 NOTE — Assessment & Plan Note (Signed)
Should be on aspirin, statin

## 2021-11-23 NOTE — Assessment & Plan Note (Signed)
LFTs resolved

## 2021-11-23 NOTE — Assessment & Plan Note (Signed)
Completed Abx

## 2021-11-23 NOTE — Assessment & Plan Note (Signed)
Delirium precautions:   -Lights and TV off, minimize interruptions at night  -Blinds open and lights on during day  -Glasses/hearing aid with patient  -Frequent reorientation  -PT/OT when able  -Avoid sedation medications/Beers list medications

## 2021-11-23 NOTE — Assessment & Plan Note (Signed)
Uses methadone >100 mg daily.  Here, still confused and appears sedated to me -Continue methadone 50 mg daily -  Stop fentanly and oxycodone

## 2021-11-23 NOTE — Progress Notes (Signed)
   11/23/21 2226  Assess: MEWS Score  Temp 98 F (36.7 C)  BP 98/65  Pulse Rate 94  ECG Heart Rate 92  Resp 11  Level of Consciousness Alert  SpO2 91 %  O2 Device Nasal Cannula  Patient Activity (if Appropriate) In bed  O2 Flow Rate (L/min) 3 L/min

## 2021-11-23 NOTE — Progress Notes (Signed)
Progress Note    Ernest Reese   QVZ:[id number]  DOB: Apr 28, 1971  DOA: 11/16/2021     6 Date of Service: 11/23/2021     Brief summary: Ernest Reese is a 50 y.o. M with opiate dependence, hx IVDA, COPD not on O2, CKD IIIa, and hep s/p streatment who presented for sluggishness, confusion, hypoxia.  Friend activated EMS because patient was altered.  In the ER, he was hypoxic to 80s, RR 12, CT showed basilar lung infiltrates, Cr 4.24 from baseline 1.2 and LFTs >4000.     11/21: Admitted 11/22: Remained encephalopathic, delirious 11/23: PCCM consulted and transferred to ICU on Precedex 11/25: Off Precedex, transferred [name] 11/27: Still sleepy, somewhat disoriented and not following commands consistently        Assessment and Plan * Severe sepsis with acute organ dysfunction [facility]) Patient presented with HR > 90, WBC 16K, elevated lactic acid, encephalopathy, renal failure and liver injury in setting of pneumonia and procal 14. Completed antibiotics, Patient now taking orals.  Temp < 100 F, heart rate < 100bpm, RR < 24.  Still hypoxic on 2L    Acute metabolic encephalopathy Delirium precautions:   -Lights and TV off, minimize interruptions at night  -Blinds open and lights on during day  -Glasses/hearing aid with patient  -Frequent reorientation  -PT/OT when able  -Avoid sedation medications/Beers list medications    Ischemic hepatitis LFTs resolved  Aspiration pneumonia (HCC) Completed Abx  Acute renal failure superimposed on stage 3a chronic kidney disease (HCC) Resolved  Bladder obstruction Resolved -Continue Flomax, no need to continue on d/c  Opiate overdose (HCC) Uses methadone >100 mg daily.  Here, still confused and appears sedated to me -Continue methadone 50 mg daily -  Stop fentanly and oxycodone  COPD (chronic obstructive pulmonary disease) (HCC) -Continue Pulmicort  Atherosclerosis of aorta ([place])  Should be on aspirin, statin  Polysubstance  abuse ([place]) -Continue methadone     Subjective:  Feels fine, wants to go home.  Falls asleep while eating his lunch, falls asleep midsentence.  No fever.  No chest discomfort, abdominal discomfort.  Objective Vitals:   11/23/21 1300 11/23/21 1400 11/23/21 1515 11/23/21 1600  BP: 105/72 (!) 97/54  100/60  Pulse: 69 88  85  Resp: (!) 9 10  (!) 8  Temp:   97.7 F (36.5 C)   TempSrc:   Axillary   SpO2: 94% 90%  94%  Weight:      Height:       62.3 kg  Vital signs reviewed and remarkable for respiratory rate 8-10   Exam Physical Exam Constitutional:      General: He is not in acute distress.    Comments: Sleepy  HENT:     Nose: No nasal deformity or rhinorrhea.     Mouth/Throat:     Lips: Pink. No lesions.     Mouth: Mucous membranes are moist. No oral lesions.     Dentition: Normal dentition.     Pharynx: Oropharynx is clear. No posterior oropharyngeal erythema.  Eyes:     General: Lids are normal. Gaze aligned appropriately.     Extraocular Movements: Extraocular movements intact.     Conjunctiva/sclera: Conjunctivae normal.  Cardiovascular:     Rate and Rhythm: Normal rate and regular rhythm.     Pulses:          Radial pulses are 2+ on the right side and 2+ on the left side.     Heart sounds: Normal heart sounds,  S1 normal and S2 normal. No murmur heard. Pulmonary:     Effort: Pulmonary effort is normal. No respiratory distress.     Breath sounds: No wheezing or rales.  Abdominal:     General: There is no distension.     Palpations: Abdomen is soft.     Tenderness: There is no abdominal tenderness. There is no guarding or rebound.  Musculoskeletal:     Right lower leg: No edema.     Left lower leg: No edema.  Skin:    General: Skin is warm and dry.     Findings: No lesion or rash.  Neurological:     Cranial Nerves: Cranial nerves are intact. No dysarthria or facial asymmetry.     Motor: Weakness present.  Psychiatric:        Attention and Perception: He  is inattentive.        Mood and Affect: Affect is blunt.        Speech: Speech is delayed.        Cognition and Memory: Cognition is impaired. Memory is impaired.        Judgment: Judgment is inappropriate.       Labs / Other Information My review of labs, imaging, notes and other tests is significant for Normal sodium, electrolytes, renal function.  LFTs almost back to normal.  Blood counts normal.     Disposition Plan: Status is: Inpatient  Remains inpatient appropriate because: he remains confused and sleepy and inappropriate.  Still on O2.  If we are able to wean off O2 and if his mentation/delirium improve tomorrow, possibly home  Spoke to daughter by phone        Time spent: 35 minutes [facility] 11/23/2021, 4:34 PM

## 2021-11-23 NOTE — Assessment & Plan Note (Signed)
Continue Pulmicort

## 2021-11-23 NOTE — Assessment & Plan Note (Signed)
Patient presented with HR > 90, WBC 16K, elevated lactic acid, encephalopathy, renal failure and liver injury in setting of pneumonia and procal 14. Completed antibiotics, Patient now taking orals.  Temp < 100 F, heart rate < 100bpm, RR < 24.  Still hypoxic on 2L

## 2021-11-24 ENCOUNTER — Inpatient Hospital Stay (HOSPITAL_COMMUNITY): Payer: Medicaid Other

## 2021-11-24 DIAGNOSIS — J690 Pneumonitis due to inhalation of food and vomit: Secondary | ICD-10-CM | POA: Diagnosis not present

## 2021-11-24 DIAGNOSIS — N179 Acute kidney failure, unspecified: Secondary | ICD-10-CM | POA: Diagnosis not present

## 2021-11-24 DIAGNOSIS — A419 Sepsis, unspecified organism: Secondary | ICD-10-CM | POA: Diagnosis not present

## 2021-11-24 DIAGNOSIS — G9341 Metabolic encephalopathy: Secondary | ICD-10-CM | POA: Diagnosis not present

## 2021-11-24 DIAGNOSIS — J9601 Acute respiratory failure with hypoxia: Secondary | ICD-10-CM | POA: Diagnosis present

## 2021-11-24 LAB — CBC
HCT: 42.4 % (ref 39.0–52.0)
Hemoglobin: 12.7 g/dL — ABNORMAL LOW (ref 13.0–17.0)
MCH: 23.7 pg — ABNORMAL LOW (ref 26.0–34.0)
MCHC: 30 g/dL (ref 30.0–36.0)
MCV: 79.3 fL — ABNORMAL LOW (ref 80.0–100.0)
Platelets: 166 10*3/uL (ref 150–400)
RBC: 5.35 MIL/uL (ref 4.22–5.81)
RDW: 19.4 % — ABNORMAL HIGH (ref 11.5–15.5)
WBC: 8 10*3/uL (ref 4.0–10.5)
nRBC: 0 % (ref 0.0–0.2)

## 2021-11-24 LAB — COMPREHENSIVE METABOLIC PANEL
ALT: 127 U/L — ABNORMAL HIGH (ref 0–44)
AST: 27 U/L (ref 15–41)
Albumin: 2.5 g/dL — ABNORMAL LOW (ref 3.5–5.0)
Alkaline Phosphatase: 65 U/L (ref 38–126)
Anion gap: 6 (ref 5–15)
BUN: 29 mg/dL — ABNORMAL HIGH (ref 6–20)
CO2: 28 mmol/L (ref 22–32)
Calcium: 8.3 mg/dL — ABNORMAL LOW (ref 8.9–10.3)
Chloride: 105 mmol/L (ref 98–111)
Creatinine, Ser: 1.35 mg/dL — ABNORMAL HIGH (ref 0.61–1.24)
GFR, Estimated: >60 mL/min (ref >60)
Glucose, Bld: 155 mg/dL — ABNORMAL HIGH (ref 70–99)
Potassium: 3.6 mmol/L (ref 3.5–5.1)
Sodium: 139 mmol/L (ref 135–145)
Total Bilirubin: 0.7 mg/dL (ref 0.3–1.2)
Total Protein: 5.8 g/dL — ABNORMAL LOW (ref 6.5–8.1)

## 2021-11-24 LAB — BLOOD GAS, ARTERIAL
Acid-Base Excess: 3.7 mmol/L — ABNORMAL HIGH (ref 0.0–2.0)
Bicarbonate: 29.1 mmol/L — ABNORMAL HIGH (ref 20.0–28.0)
FIO2: 44
O2 Saturation: 89.2 %
Patient temperature: 37
pCO2 arterial: 55.7 mmHg — ABNORMAL HIGH (ref 32.0–48.0)
pH, Arterial: 7.337 — ABNORMAL LOW (ref 7.350–7.450)
pO2, Arterial: 61.8 mmHg — ABNORMAL LOW (ref 83.0–108.0)

## 2021-11-24 LAB — PROTIME-INR
INR: 1.3 — ABNORMAL HIGH (ref 0.8–1.2)
Prothrombin Time: 16.1 seconds — ABNORMAL HIGH (ref 11.4–15.2)

## 2021-11-24 MED ORDER — FOLIC ACID 1 MG PO TABS
1.0000 mg | ORAL_TABLET | Freq: Every day | ORAL | Status: DC
  Administered 2021-11-24 – 2021-11-25 (×2): 1 mg via ORAL
  Filled 2021-11-24 (×2): qty 1

## 2021-11-24 MED ORDER — METHADONE HCL 10 MG/ML PO CONC: 40.0000 mg | Freq: Every day | ORAL | Status: DC

## 2021-11-24 NOTE — Assessment & Plan Note (Signed)
Daughter reports he uses crack, heroin. - Stop methadone given sedation, monitor for withdrawal

## 2021-11-24 NOTE — Assessment & Plan Note (Signed)
Should be on aspirin, statin

## 2021-11-24 NOTE — Assessment & Plan Note (Signed)
Patient presented with HR > 90, WBC 16K, elevated lactic acid, encephalopathy, renal failure and liver injury in setting of pneumonia and procal 14. Completed antibiotics, Patient now taking orals.  Temp < 100 F, heart rate < 100bpm, RR < 24.  Still hypoxic

## 2021-11-24 NOTE — Assessment & Plan Note (Signed)
LFTs resolved

## 2021-11-24 NOTE — Assessment & Plan Note (Signed)
Acute hepatitis panel noted for hep C Ab positive, as would be.

## 2021-11-24 NOTE — Assessment & Plan Note (Signed)
His worst withdrawal delirium is resolved, but he is Still very sleepy.   I suspect this is from methadone, as he markedly worsens after methadone.

## 2021-11-24 NOTE — Assessment & Plan Note (Addendum)
No wheezing, doubt COPD flare. -Continue Pulmicort

## 2021-11-24 NOTE — Assessment & Plan Note (Addendum)
ABG today shows pH 7.33, pCO2 >50, pO2 60 while on 4L O2.  He is also hypoxic to 84% with ambulation, even with O2 on. 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 - Obtain CT chest

## 2021-11-24 NOTE — Assessment & Plan Note (Signed)
Resolved -Continue Flomax, no need to continue on d/c

## 2021-11-24 NOTE — Progress Notes (Signed)
Physical Therapy Treatment Patient Details Name: Ernest Reese MRN: [id number] DOB: 21-May-1971 Today's Date: 11/24/2021   History of Present Illness Pt is a 50 y.o. who presents 11/16/2021 with severe sepsis with organ dysfunction, acute metabolic encephalopathy, shock liver, acute renal failure superimposed of stage 3a chronic kidney disease. Signifiacnt PMH: opiate dependence, hx IVDA, COPD, CKD IIIa.    PT Comments    Patient remains unsteady on his feet (several staggering losses of balance where he caught his balance via support surfaces--bed, wall, rail). He refuses to use a RW and reports he will be better once he walks more. See separate ambulatory oxygen saturation note with pt requiring 4L of O2 to maintain sats. Patient reports his oxygen is often in the low 80s at home and MD is aware and has not recommended home oxygen in the past. Message sent to Dr. Loleta Books (and later spoke in person regarding above).    Recommendations for follow up therapy are one component of a multi-disciplinary discharge planning process, led by the attending physician.  Recommendations may be updated based on patient status, additional functional criteria and insurance authorization.  Follow Up Recommendations  Home health PT     Assistance Recommended at Discharge PRN  Equipment Recommendations  Rolling walker (2 wheels) (pt refusing RW)    Recommendations for Other Services       Precautions / Restrictions Precautions Precautions: Fall;Other (comment) Precaution Comments: monitor HR and sats Restrictions Weight Bearing Restrictions: No     Mobility  Bed Mobility               General bed mobility comments: sitting EOB on arrival    Transfers Overall transfer level: Needs assistance Equipment used: None Transfers: Sit to/from Stand Sit to Stand: Min guard           General transfer comment: slight stagger step posteriorly upon standing with pt catching his balance against  the bed    Ambulation/Gait Ambulation/Gait assistance: Min assist Gait Distance (Feet): 100 Feet (x 2 with seated rest) Assistive device: None Gait Pattern/deviations: Staggering left;Drifts right/left Gait velocity: decr Gait velocity interpretation: 1.31 - 2.62 ft/sec, indicative of limited community ambulator   General Gait Details: one leg notably shorter than the other with lateral flxion   Stairs             Wheelchair Mobility    Modified Rankin (Stroke Patients Only)       Balance Overall balance assessment: Needs assistance Sitting-balance support: Feet supported Sitting balance-Leahy Scale: Good Sitting balance - Comments: sitting and reaching to the floor to tie his shoes   Standing balance support: No upper extremity supported Standing balance-Leahy Scale: Poor Standing balance comment: staggers upon standing                            Cognition Arousal/Alertness: Awake/alert Behavior During Therapy: Restless Overall Cognitive Status: No family/caregiver present to determine baseline cognitive functioning                                 General Comments: relaying his medical history appropriately; poor safety judgment (reports his oxygen is often in the 80s at home and MD is ok with that        Exercises      General Comments        Pertinent Vitals/Pain Pain Assessment: No/denies pain  Home Living                          Prior Function            PT Goals (current goals can now be found in the care plan section) Acute Rehab PT Goals Patient Stated Goal: pt unable Time For Goal Achievement: 12/03/21 Potential to Achieve Goals: Good Progress towards PT goals: Progressing toward goals    Frequency    Min 3X/week      PT Plan Discharge plan needs to be updated    Co-evaluation              AM-PAC PT "6 Clicks" Mobility   Outcome Measure  Help needed turning from your back to  your side while in a flat bed without using bedrails?: None Help needed moving from lying on your back to sitting on the side of a flat bed without using bedrails?: None Help needed moving to and from a bed to a chair (including a wheelchair)?: A Little Help needed standing up from a chair using your arms (e.g., wheelchair or bedside chair)?: A Little Help needed to walk in hospital room?: A Little Help needed climbing 3-5 steps with a railing? : A Little 6 Click Score: 20    End of Session Equipment Utilized During Treatment: Oxygen Activity Tolerance: Treatment limited secondary to medical complications (Comment) (limited by decr sats and ?run vtach (RN to confirm with telemetry)) Patient left: in bed;with call bell/phone within reach Nurse Communication: Mobility status;Other (comment) (need for O2 when up; ?vtach (she came in and saw via monitor)) PT Visit Diagnosis: Difficulty in walking, not elsewhere classified (R26.2)     Time: 4270-6237 PT Time Calculation (min) (ACUTE ONLY): 23 min  Charges:  $Gait Training: 23-37 mins                      Arby Barrette, [place]  Pager [phone] Office [phone]    Rexanne Mano 11/24/2021, 12:16 PM

## 2021-11-24 NOTE — Assessment & Plan Note (Signed)
Resolved

## 2021-11-24 NOTE — Assessment & Plan Note (Signed)
Uses methadone >100 mg daily he reports.  Here, still confused and appears sedated to me - Stop methadone -  Stop fentanly and oxycodone

## 2021-11-24 NOTE — Assessment & Plan Note (Signed)
Completed Abx

## 2021-11-24 NOTE — Progress Notes (Signed)
Progress Note    Ernest Reese   YIR:[id number]  DOB: 11-06-1971  DOA: 11/16/2021     7 Date of Service: 11/24/2021      Brief summary: [name] is a 50 y.o. M with opiate dependence, hx IVDA, COPD not on O2, CKD IIIa, and hep s/p streatment who presented for sluggishness, confusion, hypoxia.  Friend activated EMS because patient was altered.  In the ER, he was hypoxic to 80s, RR 12, CT showed basilar lung infiltrates, Cr 4.24 from baseline 1.2 and LFTs >4000.     11/21: Admitted 11/22: Remained encephalopathic, delirious 11/23: PCCM consulted and transferred to ICU on Precedex 11/25: Off Precedex, transferred [place] 11/27: Still sleepy, somewhat disoriented and not following commands consistently       Assessment and Plan * Severe sepsis with acute organ dysfunction [facility]) Patient presented with HR > 90, WBC 16K, elevated lactic acid, encephalopathy, renal failure and liver injury in setting of pneumonia and procal 14. Completed antibiotics, Patient now taking orals.  Temp < 100 F, heart rate < 100bpm, RR < 24.  Still hypoxic    Acute respiratory failure with hypoxia and hypercapnia (HCC) ABG today shows pH 7.33, pCO2 >50, pO2 60 while on 4L O2.  He is also hypoxic to 84% with ambulation, even with O2 on. 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 - Obtain CT chest  Acute metabolic encephalopathy His worst withdrawal delirium is resolved, but he is Still very sleepy.   I suspect this is from methadone, as he markedly worsens after methadone.     Ischemic hepatitis LFTs resolved  Aspiration pneumonia (HCC) Completed Abx  Acute renal failure superimposed on stage 3a chronic kidney disease (HCC) Resolved  Bladder obstruction Resolved -Continue Flomax, no need to continue on d/c  Opiate overdose (HCC) Uses methadone >100  mg daily he reports.  Here, still confused and appears sedated to me - Stop methadone -  Stop fentanly and oxycodone  COPD (chronic obstructive pulmonary disease) (HCC) No wheezing, doubt COPD flare. -Continue Pulmicort  Atherosclerosis of aorta (HCC)  Should be on aspirin, statin  History of hepatitis C Acute hepatitis panel noted for hep C Ab positive, as would be.  Polysubstance abuse [facility]) Daughter reports he uses crack, heroin. - Stop methadone given sedation, monitor for withdrawal     Subjective:  Patient wants to go home.  He reports that he is fine.  However he is too sleepy to stay awake during our conversation, and is desaturating with ambulation.  He has a wet sounding cough.  He has had no fever.  He is oriented to self and situation is very somnolent.  Denies chest pain, headache, abdominal pain.  Objective Vitals:   11/24/21 0812 11/24/21 1224 11/24/21 1609 11/24/21 1713  BP:  110/72  120/68  Pulse:  91  93  Resp:  18  18  Temp:  97.8 F (36.6 C)  98 F (36.7 C)  TempSrc:  Oral  Oral  SpO2: (!) 89% 92% (!) 89% (!) 87%  Weight:      Height:       64.9 kg  Vital signs reviewed and remarkable for marked hypoxia   Exam Physical Exam Constitutional:      General: He is not in acute distress.    Appearance: He is not ill-appearing or toxic-appearing.  HENT:  Nose: Nose normal. No congestion.     Mouth/Throat:     Mouth: Mucous membranes are moist.     Pharynx: No oropharyngeal exudate.     Comments: Edentulous Eyes:     General: No scleral icterus.    Extraocular Movements: Extraocular movements intact.     Pupils: Pupils are equal, round, and reactive to light.  Cardiovascular:     Rate and Rhythm: Normal rate.     Heart sounds: No murmur heard.   No gallop.  Pulmonary:     Breath sounds: No wheezing or rales.     Comments: Slow respiratory rate noted Abdominal:     Palpations: Abdomen is soft.     Tenderness: There is no abdominal  tenderness. There is no guarding.  Skin:    General: Skin is warm and dry.  Neurological:     General: No focal deficit present.     Mental Status: He is oriented to person, place, and time.     Cranial Nerves: No cranial nerve deficit.     Motor: No weakness.  Psychiatric:        Attention and Perception: He is inattentive.        Mood and Affect: Affect is blunt.        Speech: Speech is delayed.        Behavior: Behavior is slowed.        Cognition and Memory: Cognition is impaired. Memory is impaired.        Judgment: Judgment is impulsive.     Comments: Somnolent       Labs / Other Information My review of labs, imaging, notes and other tests is significant for Creatinine up to 1.3, white blood cell count normal, ALT down to 127     Disposition Plan: Status is: Inpatient  Remains inpatient appropriate because: Surgeon, desaturates with minimal ambulation, seems somnolent.  Discussed with daughter at the bedside        Time spent: 35 minutes [facility] 11/24/2021, 5:53 PM

## 2021-11-24 NOTE — Progress Notes (Signed)
Restraint flowsheet and best practice message continued to populate however restraints were discontinued as of 11/23/21 at 0811 and PM shift documented on this section of flowsheet so that the message would stop populating.

## 2021-11-24 NOTE — Progress Notes (Signed)
SATURATION QUALIFICATIONS: (This note is used to comply with regulatory documentation for home oxygen)  Patient Saturations on Room Air at Rest = 84%  (good pleth on earlobe)  Patient Saturations on Room Air while Ambulating = NA; below 88% at rest  Patient Saturations on 4 Liters of oxygen while Ambulating = 89%  Please briefly explain why patient needs home oxygen:  To maintain oxygen saturation >87% during functional activity.   Barry Brunner, PT Pager [phone]

## 2021-11-25 ENCOUNTER — Other Ambulatory Visit (HOSPITAL_COMMUNITY): Payer: Self-pay

## 2021-11-25 ENCOUNTER — Inpatient Hospital Stay (HOSPITAL_COMMUNITY): Payer: Medicaid Other

## 2021-11-25 DIAGNOSIS — R6520 Severe sepsis without septic shock: Secondary | ICD-10-CM | POA: Diagnosis not present

## 2021-11-25 DIAGNOSIS — A419 Sepsis, unspecified organism: Secondary | ICD-10-CM | POA: Diagnosis not present

## 2021-11-25 LAB — PROTIME-INR
INR: 1.2 (ref 0.8–1.2)
Prothrombin Time: 14.9 seconds (ref 11.4–15.2)

## 2021-11-25 LAB — BASIC METABOLIC PANEL
Anion gap: 3 — ABNORMAL LOW (ref 5–15)
BUN: 18 mg/dL (ref 6–20)
CO2: 32 mmol/L (ref 22–32)
Calcium: 8.3 mg/dL — ABNORMAL LOW (ref 8.9–10.3)
Chloride: 101 mmol/L (ref 98–111)
Creatinine, Ser: 0.93 mg/dL (ref 0.61–1.24)
GFR, Estimated: >60 mL/min (ref >60)
Glucose, Bld: 181 mg/dL — ABNORMAL HIGH (ref 70–99)
Potassium: 3.9 mmol/L (ref 3.5–5.1)
Sodium: 136 mmol/L (ref 135–145)

## 2021-11-25 MED ORDER — AMOXICILLIN-POT CLAVULANATE 875-125 MG PO TABS
1.0000 | ORAL_TABLET | Freq: Two times a day (BID) | ORAL | Status: DC
  Administered 2021-11-25: 1 via ORAL

## 2021-11-25 MED ORDER — PREDNISONE 20 MG PO TABS: 40.0000 mg | ORAL_TABLET | Freq: Every day | ORAL | Status: DC

## 2021-11-25 MED ORDER — AMOXICILLIN-POT CLAVULANATE 875-125 MG PO TABS
ORAL_TABLET | ORAL | Status: AC
  Filled 2021-11-25: qty 1

## 2021-11-25 MED ORDER — IOHEXOL 350 MG/ML SOLN
65.0000 mL | Freq: Once | INTRAVENOUS | Status: AC | PRN
  Administered 2021-11-25: 65 mL via INTRAVENOUS

## 2021-11-25 MED ORDER — AMOXICILLIN-POT CLAVULANATE 875-125 MG PO TABS
1.0000 | ORAL_TABLET | Freq: Two times a day (BID) | ORAL | 0 refills | Status: AC
  Filled 2021-11-25: qty 9, 5d supply, fill #0

## 2021-11-25 MED ORDER — ALBUTEROL SULFATE (2.5 MG/3ML) 0.083% IN NEBU
2.5000 mg | INHALATION_SOLUTION | Freq: Four times a day (QID) | RESPIRATORY_TRACT | 0 refills | Status: AC | PRN
Start: 2021-11-25 — End: ?
  Filled 2021-11-25: qty 90, 8d supply, fill #0

## 2021-11-25 MED ORDER — GABAPENTIN 100 MG PO CAPS
100.0000 mg | ORAL_CAPSULE | Freq: Three times a day (TID) | ORAL | 0 refills | Status: AC
  Filled 2021-11-25: qty 90, 30d supply, fill #0

## 2021-11-25 MED ORDER — PREDNISONE 20 MG PO TABS
40.0000 mg | ORAL_TABLET | Freq: Every day | ORAL | 0 refills | Status: AC
  Filled 2021-11-25: qty 8, 4d supply, fill #0

## 2021-11-25 NOTE — Progress Notes (Signed)
Pt has scripts filled by [facility] pharmacy, and delivered to pt's room. 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 Daughter has been contacted and she will be coming to pick pt up, she is waiting for someone to care for her children then she will be coming here, she is going to call when she arrives to hospital entrance and staff will take pt down.

## 2021-11-25 NOTE — TOC Initial Note (Addendum)
Transition of Care [facility]) - Initial/Assessment Note    Patient Details  Name: Ernest Reese MRN: [id number] Date of Birth: [birth year]  [facility] of [facility]) CM/SW Contact:    Carles Collet, RN Phone Number: 11/25/2021, 12:24 PM  Clinical Narrative:             Damaris Schooner w patient at bedside. He states that he plans to return home to [place] [address], with his roommate.  He follows for methadone treatment at [facility]. He states that he has a RW at home. He confirms he will have transportation on [place]. Unable to establish [facility] services due to payor source and +cocaine upon admission 11/16/21.  TOC will continue to follow for potential home oxygen needs.   Oxygen ordered through [employer], will be delivered to the room today for discharge.    Expected Discharge Plan: Home/Self Care Barriers to Discharge: Continued Medical Work up   Patient Goals and CMS Choice Patient states their goals for this hospitalization and ongoing recovery are:: wants to go home today so he can be home tomorrow for his birthday      Expected Discharge Plan and Services Expected Discharge Plan: Home/Self Care In-house Referral: Clinical Social Work Discharge Planning Services: CM Consult     Expected Discharge Date: 11/24/21                                    Prior Living Arrangements/Services   Lives with:: Roommate                   Activities of Daily Living      Permission Sought/Granted                  Emotional Assessment              Admission diagnosis:  Acute-on-chronic kidney injury ([place]) [N17.9, N18.9] Sepsis due to pneumonia ([place]) [J18.9, A41.9] Patient Active Problem List   Diagnosis Date Noted   Acute respiratory failure with hypoxia and hypercapnia ([place]) 11/24/2021   Bladder obstruction 11/18/2021   Severe sepsis with acute organ dysfunction ([place]) 11/17/2021   Opiate overdose ([place]) 11/17/2021   Aspiration pneumonia ([place]) 11/17/2021    Ischemic hepatitis 10/30/1593   Acute metabolic encephalopathy 58/59/2924   COPD (chronic obstructive pulmonary disease) ([place])    Methicillin susceptible S. 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[place])    Infection of superficial incisional surgical site after procedure    Tobacco abuse 02/25/2021   Polysubstance abuse ([place]) 02/25/2021   Recurrent history of leaving AMA 02/25/2021   Atherosclerosis of aorta ([place]) 02/25/2021   Right lung Multifocal, major fissure, pleural-based fluid areas 02/25/2021   Cavitary lesion of right lower lung, superior segment 02/25/2021   Ground glass opacity present on imaging of lung 02/25/2021   COPD exacerbation ([place]) 02/25/2021   Surgical site infection 02/24/2021   Dyspnea 02/24/2021   Pain in joint of left shoulder 01/03/2021   Hx of hepatitis C, reportedly treated 12/11/2018   History of hepatitis C 12/11/2018   Renal insufficiency 12/09/2018   Acute exacerbation of chronic obstructive airways disease ([place]) 12/09/2018   Anemia 12/09/2018   Acute renal failure superimposed on stage 3a chronic kidney disease ([place]) 12/09/2018   Malnutrition, calorie ([place]) 12/09/2018  Renal impairment 12/09/2018   Acute bilateral low back pain with bilateral sciatica 05/17/2017   PCP:  Marzetta Board, NP Pharmacy:  No Pharmacies Listed    Social Determinants of Health (SDOH) Interventions    Readmission Risk Interventions No flowsheet data found.

## 2021-11-25 NOTE — Assessment & Plan Note (Signed)
LFTs resolved

## 2021-11-25 NOTE — Assessment & Plan Note (Signed)
Acute hepatitis panel noted for hep C Ab positive, as would be.

## 2021-11-25 NOTE — Assessment & Plan Note (Signed)
Uses methadone >100 mg daily he reports.  Here, still confused and appears sedated to me - Stop methadone -  Stop fentanly and oxycodone

## 2021-11-25 NOTE — Assessment & Plan Note (Signed)
Should be on aspirin, statin

## 2021-11-25 NOTE — Assessment & Plan Note (Signed)
ABG today shows pH 7.33, pCO2 >50, pO2 60 while on 4L O2.  He is also hypoxic to 84% with ambulation, even with O2 on. 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 - Obtain CT chest

## 2021-11-25 NOTE — Assessment & Plan Note (Signed)
Completed Abx

## 2021-11-25 NOTE — Plan of Care (Signed)
  Problem: Safety: Goal: Violent Restraint(s) Outcome: Adequate for Discharge

## 2021-11-25 NOTE — Assessment & Plan Note (Signed)
Resolved

## 2021-11-25 NOTE — Assessment & Plan Note (Signed)
Daughter reports he uses crack, heroin. - Stop methadone given sedation, monitor for withdrawal

## 2021-11-25 NOTE — Assessment & Plan Note (Signed)
No wheezing, doubt COPD flare. -Continue Pulmicort

## 2021-11-25 NOTE — Progress Notes (Signed)
Pt's daughter here, pt d/c'd via wheelchair on his O2 tank, and with his belongings, other O2 tank concentrator and scripts that were filled. Escorted by unit staff.

## 2021-11-25 NOTE — Assessment & Plan Note (Signed)
Resolved -Continue Flomax, no need to continue on d/c

## 2021-11-25 NOTE — Progress Notes (Signed)
Discharge instructions (including medications) discussed with and copy provided to patient/caregiver 

## 2021-11-25 NOTE — Discharge Summary (Signed)
Physician Discharge Summary   Patient name: Ernest Reese  Admit date:     11/16/2021  Discharge date: 11/25/2021  Discharge Physician: Edwin Dada   PCP: Marzetta Board, NP   Recommendations at discharge:  Follow up with PCP in 1 week Dr. Reece Levy: Reduce Methadone to 30 mg or lower, review options for buprenorphine, given his respiratory disease     Discharge Diagnoses Principal Problem:   Severe sepsis with acute organ dysfunction [facility]) Active Problems:   Acute renal failure superimposed on stage 3a chronic kidney disease ([place])   Aspiration pneumonia (HCC)   Ischemic hepatitis   Acute metabolic encephalopathy   Acute respiratory failure with hypoxia and hypercapnia (HCC)   Opiate overdose (HCC)   Bladder obstruction   COPD (chronic obstructive pulmonary disease) (HCC)   Atherosclerosis of aorta (HCC)   History of hepatitis C   Polysubstance abuse [facility])     Hospital Course   Mr. Ernest Reese is a 50 y.o. M with opiate dependence, hx IVDA, COPD not on O2, CKD IIIa, and hep s/p streatment who presented for sluggishness, confusion, hypoxia.  Friend activated EMS because patient was altered.  In the ER, he was hypoxic to 80s, RR 12, CT showed basilar lung infiltrates, Cr 4.24 from baseline 1.2 and LFTs >4000.     11/21: Admitted 11/22: Remained encephalopathic, delirious 11/23: PCCM consulted and transferred to ICU on Precedex 11/25: Off Precedex, transferred [place] 11/29: Mentation improved to baseline with stopping methadone, discharged on O2      Principal discharge diagnosis: * Severe sepsis with acute organ dysfunction [facility]) Patient presented with HR > 90, WBC 16K, elevated lactic acid, encephalopathy, renal failure and liver injury in setting of pneumonia and procal 14.  Completed antibiotics, Patient now taking orals.  Temp < 100 F, heart rate < 100bpm, RR < 24.   Repeat CT reviewed with pulmonology, likely an aspiration component given distribution,  infection mostly resolved (and patient asymptomatic, afebrile), but will give 5 more days Augmentin given extent of airspace disease.     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  Also has chronic COPD, and previous notes (see ER note from earlier this year) indicate patient is hypoxic to 80s at baseline.  No Pulm follow up nor PFTs.  Discharged on O2, suspect this is chronic respiratory failure.   Acute metabolic encephalopathy Withdrawal delirium, now resolved  Ischemic hepatitis LFTs >1000 at admission, treatd sepsis and LFTs resolved, no signs of further liver failure.  Aspiration pneumonia ([place])  Acute renal failure superimposed on stage 3a chronic kidney disease (HCC) Cr >1.5 on admission, now resolved  Bladder obstruction Foley placed, started on Flomax.  Foley removed prior to d/c, able to void without issue.    Opiate overdose (HCC) Uses methadone 115 mg daily, confirmed with [facility].  Here in the hospital, he was given 50 mg methadone daily and had marked somnolence, respiratory deprssion.  I discussed this with [facility] dispensing nurse Einar Crow, no provider available.  Detailed that patient cannot tolerate 50 mg of methadone even, certainly not 154 mg.  That with his respiratory disease, methadone 50 mg or above seems unreasonably dangerous.  Would recommend lower dose or Suboxone.     COPD (chronic obstructive pulmonary disease) ([place]) Discharged with prednisone 5 days, refills of albuterol.  Atherosclerosis of aorta ([place])  Should be on aspirin, statin  History of hepatitis C  Polysubstance abuse [facility]) Daughter reports he uses crack, heroin.         Condition at discharge: stable  Exam Physical Exam Constitutional:      General: He is not in acute distress.    Appearance: He is not ill-appearing, toxic-appearing or diaphoretic.     Comments:  underweight  HENT:     Head: Normocephalic and atraumatic.     Nose: Nose normal. No congestion.     Mouth/Throat:     Mouth: Mucous membranes are moist.     Pharynx: Oropharynx is clear.     Comments: Poor dentition, mostly edentulous Eyes:     General: No scleral icterus.    Extraocular Movements: Extraocular movements intact.     Pupils: Pupils are equal, round, and reactive to light.  Cardiovascular:     Rate and Rhythm: Normal rate.     Heart sounds: No murmur heard.   No gallop.  Pulmonary:     Effort: Pulmonary effort is normal.     Breath sounds: No wheezing.     Comments: Crackles at both bases, more on right Abdominal:     Palpations: Abdomen is soft.     Tenderness: There is no abdominal tenderness. There is no guarding.  Skin:    Capillary Refill: Capillary refill takes less than 2 seconds.  Neurological:     General: No focal deficit present.     Mental Status: He is oriented to person, place, and time. Mental status is at baseline.     Motor: No weakness.  Psychiatric:        Mood and Affect: Mood normal.        Behavior: Behavior normal.        Thought Content: Thought content normal.        Judgment: Judgment normal.      Disposition: Home health  Discharge time: greater than 30 minutes.  Follow-up Information     Marzetta Board, NP. Schedule an appointment as soon as possible for a visit in 1 week(s).   Specialty: Adult Health Nurse Practitioner Contact information: [place] [phone]                 Allergies as of 11/25/2021   No Known Allergies      Medication List     STOP taking these medications    FeroSul 325 (65 FE) MG tablet Generic drug: ferrous sulfate   ibuprofen 800 MG tablet Commonly known as: ADVIL   methadone 10 MG/ML solution Commonly known as: DOLOPHINE   ondansetron 4 MG disintegrating tablet Commonly known as: Zofran ODT   oxyCODONE 5 MG immediate release  tablet Commonly known as: Oxy IR/ROXICODONE       TAKE these medications    albuterol (2.5 MG/3ML) 0.083% nebulizer solution Commonly known as: PROVENTIL Use 1 vial (2.5 mg total) by nebulization every 6 (six) hours as needed for shortness of breath. What changed: Another medication with the same name was removed. Continue taking this medication, and follow the directions you see here.   amoxicillin-clavulanate 875-125 MG tablet Commonly known as: AUGMENTIN Take 1 tablet by mouth every 12 (twelve) hours.   gabapentin 100 MG capsule Commonly known as: NEURONTIN Take 1 capsule (100 mg total) by mouth 3 (three) times daily. What changed:  medication strength how much to take Another medication with the same name was removed. Continue taking this medication, and follow the directions you see here.   predniSONE 20 MG  tablet Commonly known as: DELTASONE Take 2 tablets (40 mg total) by mouth daily with breakfast. Start taking on: November 26, 2021               Durable Medical Equipment  (From admission, onward)           Start     Ordered   11/25/21 1350  For home use only DME oxygen  Once       Question Answer Comment  Length of Need 6 Months   Mode or (Route) Nasal cannula   Liters per Minute 2   Frequency Continuous (stationary and portable oxygen unit needed)   Oxygen delivery system Gas      11/25/21 1354            CT ABDOMEN PELVIS WO CONTRAST  Result Date: 11/16/2021 CLINICAL DATA:  Abdominal pain, fever EXAM: CT ABDOMEN AND PELVIS WITHOUT CONTRAST TECHNIQUE: Multidetector CT imaging of the abdomen and pelvis was performed following the standard protocol without IV contrast. COMPARISON:  None. FINDINGS: Lower chest: There is bibasilar airspace infiltrate with more focal consolidation within the right lower lobe in keeping with multifocal pneumonic consolidation or aspiration. No pleural effusion. Mild coronary artery calcification. Global cardiac  size within normal limits. Hepatobiliary: No focal liver abnormality is seen. No gallstones, gallbladder wall thickening, or biliary dilatation. Pancreas: Unremarkable Spleen: Unremarkable Adrenals/Urinary Tract: Adrenal glands are unremarkable. Kidneys are normal, without renal calculi, focal lesion, or hydronephrosis. Bladder is distended, but is otherwise unremarkable. Stomach/Bowel: Stomach is within normal limits. Appendix appears normal. No evidence of bowel wall thickening, distention, or inflammatory changes. No free intraperitoneal gas or fluid. Vascular/Lymphatic: Aortic atherosclerosis. No enlarged abdominal or pelvic lymph nodes. Reproductive: Prostate is unremarkable. Other: No abdominal wall hernia. Musculoskeletal: No acute bone abnormality. Degenerative changes are seen within the lumbar spine. IMPRESSION: Bibasilar pulmonary infiltrate, with more focal consolidation within the right lower lobe, in keeping with multifocal pneumonic consolidation versus aspiration. Moderate distention of the bladder, possibly related to voluntary retention versus bladder outlet obstruction. No superimposed hydronephrosis. Aortic Atherosclerosis (ICD10-I70.0). Electronically Signed   By: Fidela Salisbury M.D.   On: 11/16/2021 23:51   CT HEAD WO CONTRAST (5MM)  Result Date: 11/16/2021 CLINICAL DATA:  Altered mental status EXAM: CT HEAD WITHOUT CONTRAST TECHNIQUE: Contiguous axial images were obtained from the base of the skull through the vertex without intravenous contrast. COMPARISON:  06/03/2019 FINDINGS: Brain: No evidence of acute infarction, hemorrhage, hydrocephalus, extra-axial collection or mass lesion/mass effect. Area of decreased attenuation is noted in the right posterior parietal region consistent with prior infarct this is stable in appearance from the prior exam Vascular: No hyperdense vessel or unexpected calcification. Skull: Normal. Negative for fracture or focal lesion. Sinuses/Orbits: No acute  finding. Other: None. IMPRESSION: Old right parietal infarct. No acute intracranial abnormality noted. Electronically Signed   By: Inez Catalina M.D.   On: 11/16/2021 23:47   CT CHEST W CONTRAST  Result Date: 11/25/2021 CLINICAL DATA:  50 year old male with hypoxia. Abnormal right lower lung opacity on portable chest x-ray yesterday. History of polysubstance abuse, sepsis. EXAM: CT CHEST WITH CONTRAST TECHNIQUE: Multidetector CT imaging of the chest was performed during intravenous contrast administration. CONTRAST:  75mL OMNIPAQUE IOHEXOL 350 MG/ML SOLN COMPARISON:  Portable chest 11/24/2021.  Chest CT 02/24/2021. FINDINGS: Cardiovascular: Calcified coronary artery atherosclerosis. No cardiomegaly or pericardial effusion. Comparatively mild thoracic aortic atherosclerosis. Mediastinum/Nodes: Negative. No mediastinal mass or lymphadenopathy. Lungs/Pleura: Larger lung volumes compared to the February CTA. The trachea  and right side major airways are patent but there is bubbly opacity layering in the left mainstem bronchus and involving the lower lobe segmental bronchi. There is patchy peribronchial consolidation in the superior segment and posterior basal segment right lower lobe. There is chronic scarring in the posteroinferior right lower lobe contiguous with the right major fissure, site of cavitary/loculated pleuroparenchymal process last year. Similar scarring across the fissure in the superior segment lower lobe on series 5, image 72. And small indistinct new pulmonary nodules in the posterior right upper lobe (series 5, image 52) and scattered in the superior segment left lower lobe (image 78. These are individually up to 7 mm. There is superimposed more confluent subpleural peribronchial opacity in the left lower lobe with additional enhancing atelectasis in the left costophrenic angle. Trace bilateral pleural fluid. Some superimposed upper lobe centrilobular emphysema. Upper Abdomen: Negative visible  liver, gallbladder, spleen, pancreas, adrenal glands, kidneys, and bowel in the upper abdomen. Musculoskeletal: Previous lower cervical ACDF. Chronic thoracic endplate irregularity. Chronic right posterior rib deformities. Partially visible right shoulder arthroplasty. Chronic bilateral anterior and lateral rib fractures also. No acute osseous abnormality identified. IMPRESSION: 1. Right Lower Lobe Pneumonia. Aspirated or retained secretions in the left mainstem with early Left Lower Lobe Pneumonia. And additional scattered indistinct right upper lobe and superior segment left lower lobe pulmonary nodules raising the possibility of additional airway - or less likely hematogenously - disseminated infection. 2. Trace layering pleural effusions. 3. Chronic pleuroparenchymal scarring along the right major fissure at the site of the cavitary/loculated process seen in February. 4. Underlying Emphysema (ICD10-J43.9). 5. Calcified coronary artery and Aortic Atherosclerosis (ICD10-I70.0). Electronically Signed   By: Genevie Ann M.D.   On: 11/25/2021 08:19   US RENAL  Result Date: 11/17/2021 CLINICAL DATA:  Acute kidney insufficiency. EXAM: RENAL / URINARY TRACT ULTRASOUND COMPLETE COMPARISON:  CT abdomen pelvis 11/16/2021. Renal ultrasound 12/09/2018 FINDINGS: Right Kidney: Renal measurements: 11.9 x 4.5 x 5.3 cm = volume: 148.9 mL. Renal parenchyma is slightly hyperechoic compared to the index organ, the liver. No discrete lesions are present. No obstruction is present. Left Kidney: Renal measurements: 11.3 x 5.1 x 4.9 cm = volume: 147.7 mL. Renal parenchyma is slightly hyperechoic compared to the index organ, the spleen. No discrete lesions are present. No obstruction is present. Bladder: Appears normal for degree of bladder distention. Other: Sludge noted in the gallbladder. No inflammatory changes are associated. IMPRESSION: 1. Echogenic kidneys again noted bilaterally. This is nonspecific but can be seen in the setting  medical renal disease. No significant interval change. Electronically Signed   By: San Morelle M.D.   On: 11/17/2021 13:03   DG Chest Port 1 View  Result Date: 11/24/2021 CLINICAL DATA:  History of pneumonia.  Emphysema. EXAM: PORTABLE CHEST 1 VIEW COMPARISON:  11/19/2021 FINDINGS: Osteopenia. Right rib fractures. Right proximal femur fixation. Cervical spine fixation. Midline trachea. Normal heart size. No pleural effusion or pneumothorax. Hyperinflation, consistent with COPD. Right mid and lower lung airspace disease again identified. Minimal improvement in right lower lobe aeration. No new pulmonary opacity. IMPRESSION: Minimally improved right-sided airspace disease, consistent with residual infection or aspiration. Underlying hyperinflation, consistent with COPD. Electronically Signed   By: Abigail Miyamoto M.D.   On: 11/24/2021 12:10   DG CHEST PORT 1 VIEW  Result Date: 11/19/2021 CLINICAL DATA:  Respiratory distress EXAM: PORTABLE CHEST 1 VIEW COMPARISON:  11/16/2021 FINDINGS: Remote right rib fractures. Right shoulder arthroplasty. Cervical spine fixation.Patient rotated left. Midline trachea. Normal heart size.  No pleural effusion or pneumothorax. Slight worsening in right mid and lower lung airspace disease. Clear left lung. IMPRESSION: Progressive right mid and lower lung airspace disease, most consistent with infection or aspiration. Electronically Signed   By: Abigail Miyamoto M.D.   On: 11/19/2021 18:13   DG Chest Portable 1 View  Result Date: 11/16/2021 CLINICAL DATA:  Cough EXAM: PORTABLE CHEST 1 VIEW COMPARISON:  06/23/2021 FINDINGS: Cardiac shadow is stable. Patchy airspace disease is noted in the right base consistent with acute pneumonia. The remainder of the lungs are clear. Postsurgical changes in the right shoulder and cervical spine are noted. IMPRESSION: Right basilar pneumonia. Electronically Signed   By: Inez Catalina M.D.   On: 11/16/2021 22:38   Results for orders placed  or performed during the hospital encounter of 11/16/21  Resp Panel by RT-PCR (Flu A&B, Covid) Nasopharyngeal Swab     Status: None   Collection Time: 11/17/21  2:33 AM   Specimen: Nasopharyngeal Swab; Nasopharyngeal(NP) swabs in vial transport medium  Result Value Ref Range Status   SARS Coronavirus 2 by RT PCR NEGATIVE NEGATIVE Final    Comment: (NOTE) SARS-CoV-2 target nucleic acids are NOT DETECTED.  The SARS-CoV-2 RNA is generally detectable in upper respiratory specimens during the acute phase of infection. The lowest concentration of SARS-CoV-2 viral copies this assay can detect is 138 copies/mL. A negative result does not preclude SARS-Cov-2 infection and should not be used as the sole basis for treatment or other patient management decisions. A negative result may occur with  improper specimen collection/handling, submission of specimen other than nasopharyngeal swab, presence of viral mutation(s) within the areas targeted by this assay, and inadequate number of viral copies(<138 copies/mL). A negative result must be combined with clinical observations, patient history, and epidemiological information. The expected result is Negative.  Fact Sheet for Patients:  EntrepreneurPulse.com.au  Fact Sheet for Healthcare Providers:  IncredibleEmployment.be  This test is no t yet approved or cleared by the [place] FDA and  has been authorized for detection and/or diagnosis of SARS-CoV-2 by FDA under an Emergency Use Authorization (EUA). This EUA will remain  in effect (meaning this test can be used) for the duration of the COVID-19 declaration under Section 564(b)(1) of the Act, 21 U.S.C.section 360bbb-3(b)(1), unless the authorization is terminated  or revoked sooner.       Influenza A by PCR NEGATIVE NEGATIVE Final   Influenza B by PCR NEGATIVE NEGATIVE Final    Comment: (NOTE) The Xpert Xpress SARS-CoV-2/FLU/RSV plus assay is intended  as an aid in the diagnosis of influenza from Nasopharyngeal swab specimens and should not be used as a sole basis for treatment. Nasal washings and aspirates are unacceptable for Xpert Xpress SARS-CoV-2/FLU/RSV testing.  Fact Sheet for Patients: EntrepreneurPulse.com.au  Fact Sheet for Healthcare Providers: IncredibleEmployment.be  This test is not yet approved or cleared by the [place] FDA and has been authorized for detection and/or diagnosis of SARS-CoV-2 by FDA under an Emergency Use Authorization (EUA). This EUA will remain in effect (meaning this test can be used) for the duration of the COVID-19 declaration under Section 564(b)(1) of the Act, 21 U.S.C. section 360bbb-3(b)(1), unless the authorization is terminated or revoked.  Performed at [facility], [place] [address] [place], [place] [address]   Culture, blood (single)     Status: None   Collection Time: 11/17/21  4:05 AM   Specimen: BLOOD LEFT HAND  Result Value Ref Range Status   Specimen Description BLOOD  LEFT HAND  Final   Special Requests   Final    AEROBIC BOTTLE ONLY Blood Culture results may not be optimal due to an inadequate volume of blood received in culture bottles   Culture   Final    NO GROWTH 5 DAYS Performed at [facility], [place] [address] [place], [place] [address]    Report Status 11/22/2021 FINAL  Final  MRSA Next Gen by PCR, Nasal     Status: None   Collection Time: 11/19/21  5:44 PM   Specimen: Nasal Mucosa; Nasal Swab  Result Value Ref Range Status   MRSA by PCR Next Gen NOT DETECTED NOT DETECTED Final    Comment: (NOTE) The GeneXpert MRSA Assay (FDA approved for NASAL specimens only), is one component of a comprehensive MRSA colonization surveillance program. It is not intended to diagnose MRSA infection nor to guide or monitor treatment for MRSA infections. Test performance is not FDA approved in patients less than 34  years old. Performed at [facility], [place] [address], [place], [place] [address]     Signed:  Edwin Dada MD.  [facility] 11/25/2021, 6:28 PM

## 2021-11-25 NOTE — Assessment & Plan Note (Signed)
Patient presented with HR > 90, WBC 16K, elevated lactic acid, encephalopathy, renal failure and liver injury in setting of pneumonia and procal 14. Completed antibiotics, Patient now taking orals.  Temp < 100 F, heart rate < 100bpm, RR < 24.  Still hypoxic

## 2021-11-25 NOTE — Assessment & Plan Note (Signed)
His worst withdrawal delirium is resolved, but he is Still very sleepy.   I suspect this is from methadone, as he markedly worsens after methadone.

## 2022-02-19 IMAGING — DX DG CHEST 1V PORT
1 series · 1 of 1 positions shown · non-contrast
Comparison: 07/14/2019

CLINICAL DATA: COPD, difficulty breathing

EXAM:
PORTABLE CHEST 1 VIEW

[chest ap]
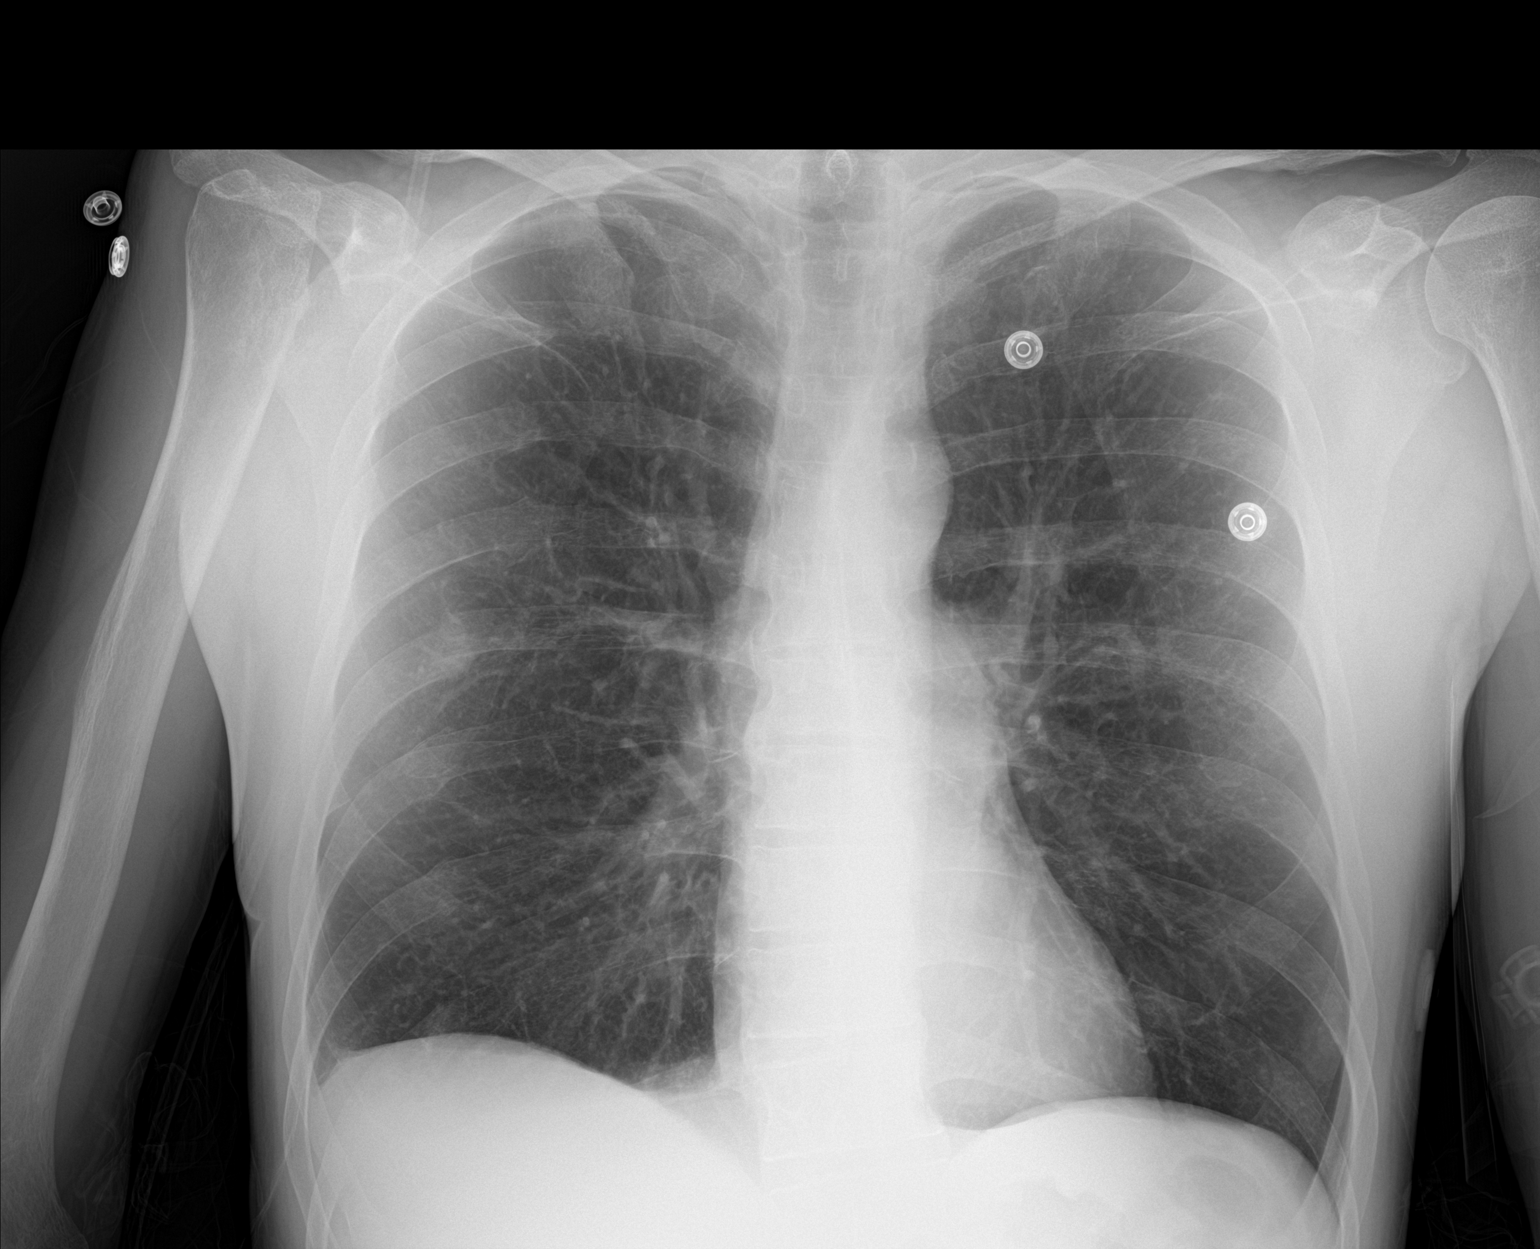

[1 of 1 positions shown; findings below may reference images not displayed]

FINDINGS: Background changes of COPD. No new consolidation or edema. No
pleural effusion or pneumothorax. Normal heart size. Chronic right
rib fractures. Anterior cervical fusion is noted. Chronic deformity
of the right humeral head.
IMPRESSION: No acute process in the chest.

## 2022-07-02 IMAGING — XA DG FLUORO GUIDE NDL PLC/BX
2 series · 2 of 2 positions shown · non-contrast
Comparison: none

CLINICAL DATA: Chronic right shoulder pain. Erosive changes of the
right shoulder. Right shoulder joint collection.

[Series 1: ortho standard · 1 of 1 slices shown (1 of 2)]
[im 1/1]
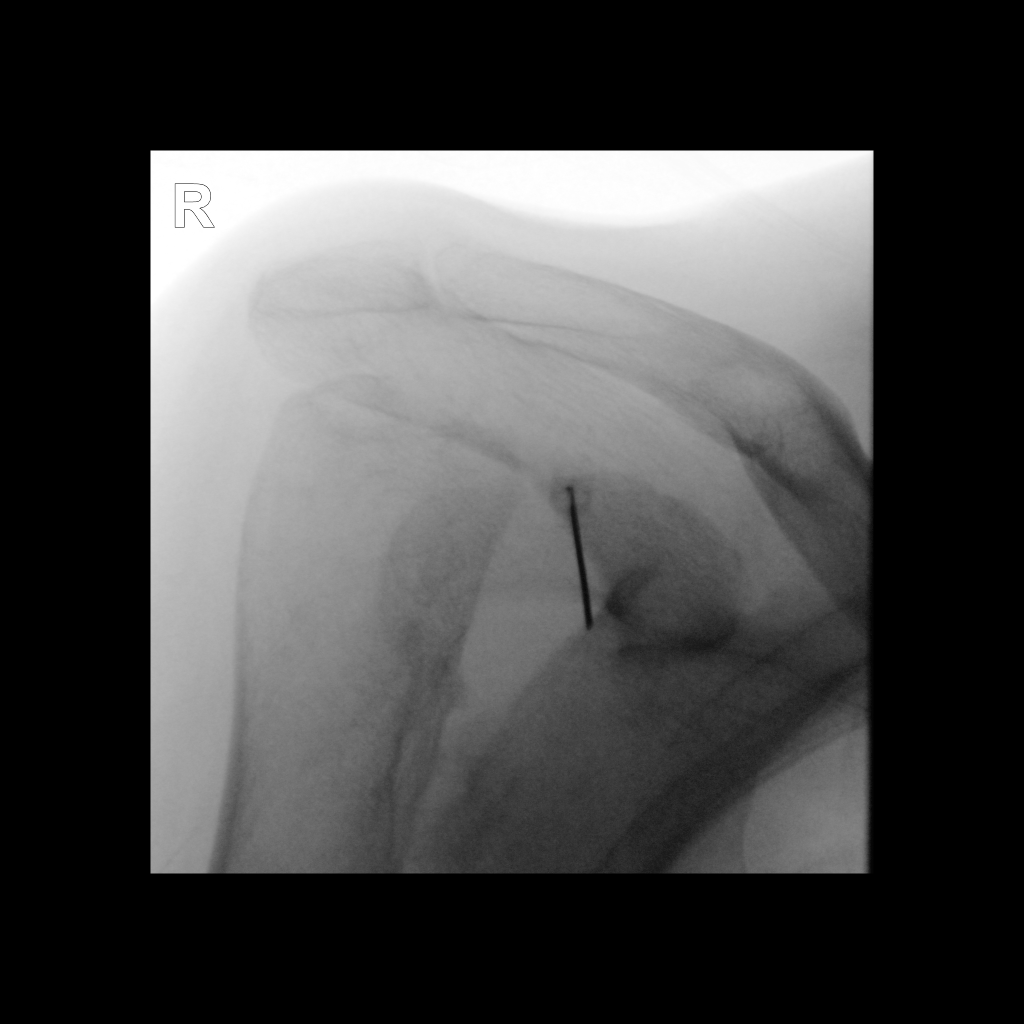

[Series 2: ortho standard · 1 of 1 slices shown (2 of 2)]
[im 1/1]
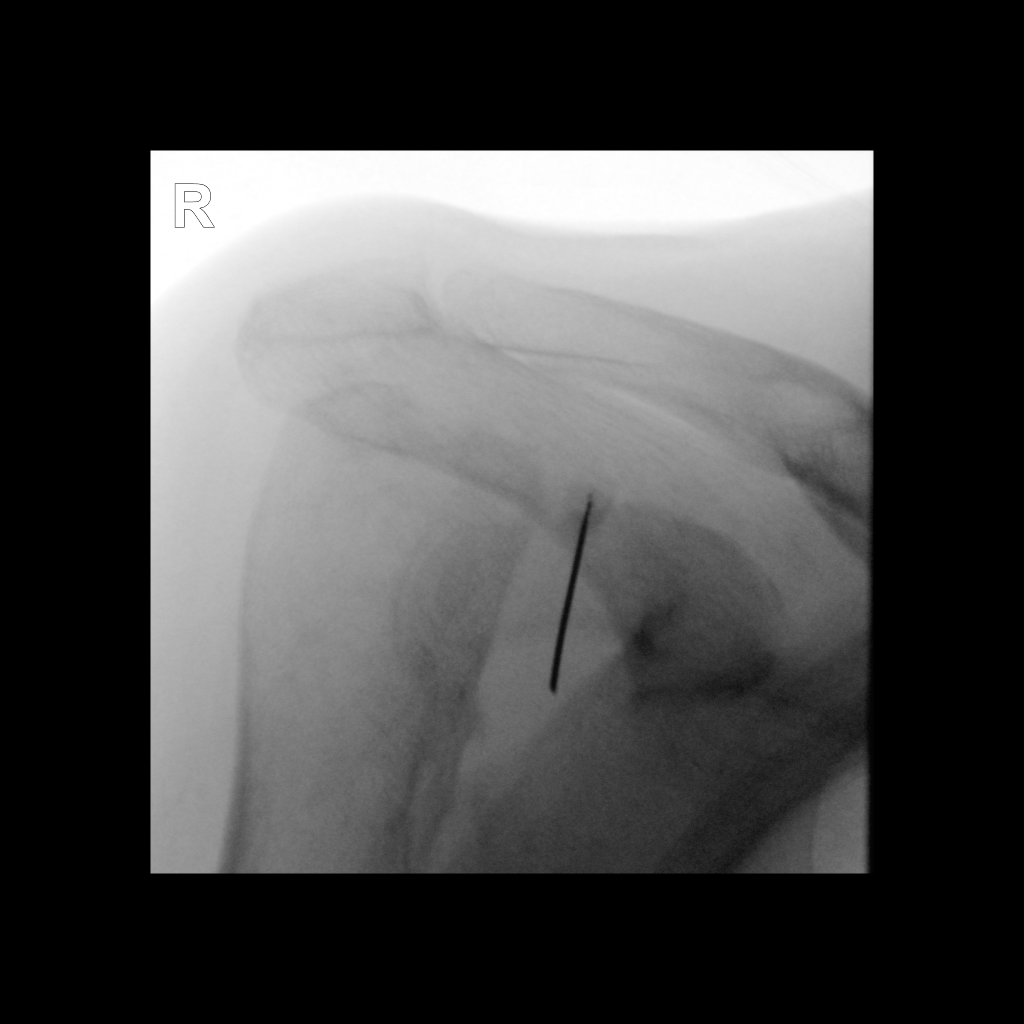

[2 of 2 positions shown; findings below may reference images not displayed]

FLUOROSCOPY TIME:  Radiation Exposure Index (as provided by the
fluoroscopic device): 17.7 uGy*m2

PROCEDURE:
Left SHOULDER ASPIRATION UNDER FLUOROSCOPY

An appropriate skin entrance site was determined. The site was
marked, prepped with Betadine, draped in the usual sterile fashion,
and infiltrated locally with buffered Lidocaine. 20 gauge spinal
needle was advanced to the superomedial margin of the humeral head
under intermittent fluoroscopy. A confirm location with a lateral
view. I head needle contact with both the residual humeral head and
glenoid. Aspirated 3 cc mostly bloody material. This was sent for
laboratory testing as requested. No immediate complication.
IMPRESSION: 1. Technically successful fluoroscopic guided aspiration the left
shoulder joint.
2. 3 cc of mostly bloody material was obtained. This may represent
chronic infection hemorrhage. No frank purulent material was
obtained despite sampling from multiple locations.
